# Patient Record
Sex: Female | Born: 1935 | Race: White | Hispanic: No | Marital: Single | Smoking: Never smoker
Health system: Southern US, Community
[De-identification: ages and names within clinical notes are randomized; demographics above are authoritative.]

## PROBLEM LIST (undated history)

## (undated) DIAGNOSIS — R0602 Shortness of breath: Secondary | ICD-10-CM

## (undated) DIAGNOSIS — E871 Hypo-osmolality and hyponatremia: Secondary | ICD-10-CM

## (undated) DIAGNOSIS — N289 Disorder of kidney and ureter, unspecified: Secondary | ICD-10-CM

## (undated) DIAGNOSIS — M316 Other giant cell arteritis: Secondary | ICD-10-CM

## (undated) DIAGNOSIS — E039 Hypothyroidism, unspecified: Secondary | ICD-10-CM

## (undated) DIAGNOSIS — I10 Essential (primary) hypertension: Secondary | ICD-10-CM

## (undated) DIAGNOSIS — K219 Gastro-esophageal reflux disease without esophagitis: Secondary | ICD-10-CM

## (undated) DIAGNOSIS — S72009A Fracture of unspecified part of neck of unspecified femur, initial encounter for closed fracture: Secondary | ICD-10-CM

## (undated) DIAGNOSIS — I4891 Unspecified atrial fibrillation: Secondary | ICD-10-CM

## (undated) DIAGNOSIS — E785 Hyperlipidemia, unspecified: Secondary | ICD-10-CM

## (undated) DIAGNOSIS — I209 Angina pectoris, unspecified: Secondary | ICD-10-CM

## (undated) DIAGNOSIS — Z950 Presence of cardiac pacemaker: Secondary | ICD-10-CM

## (undated) DIAGNOSIS — E876 Hypokalemia: Secondary | ICD-10-CM

## (undated) HISTORY — DX: Hypothyroidism, unspecified: E03.9

## (undated) HISTORY — DX: Gastro-esophageal reflux disease without esophagitis: K21.9

## (undated) HISTORY — PX: ABDOMINAL HYSTERECTOMY: SHX81

## (undated) HISTORY — DX: Other giant cell arteritis: M31.6

## (undated) HISTORY — PX: OTHER SURGICAL HISTORY: SHX169

## (undated) HISTORY — PX: CHOLECYSTECTOMY: SHX55

## (undated) HISTORY — PX: ORIF ACETABULAR FRACTURE: SHX5029

## (undated) HISTORY — DX: Hypokalemia: E87.6

## (undated) HISTORY — DX: Hypo-osmolality and hyponatremia: E87.1

## (undated) HISTORY — DX: Unspecified atrial fibrillation: I48.91

## (undated) HISTORY — PX: INSERT / REPLACE / REMOVE PACEMAKER: SUR710

## (undated) HISTORY — PX: CATARACT EXTRACTION: SUR2

---

## 1998-08-26 ENCOUNTER — Ambulatory Visit (HOSPITAL_COMMUNITY): Admission: RE | Admit: 1998-08-26 | Discharge: 1998-08-26 | Payer: Self-pay | Admitting: Cardiology

## 1999-08-31 ENCOUNTER — Ambulatory Visit (HOSPITAL_COMMUNITY): Admission: RE | Admit: 1999-08-31 | Discharge: 1999-08-31 | Payer: Self-pay | Admitting: Cardiology

## 1999-08-31 ENCOUNTER — Encounter: Payer: Self-pay | Admitting: Cardiology

## 1999-09-30 ENCOUNTER — Ambulatory Visit (HOSPITAL_BASED_OUTPATIENT_CLINIC_OR_DEPARTMENT_OTHER): Admission: RE | Admit: 1999-09-30 | Discharge: 1999-09-30 | Payer: Self-pay | Admitting: Plastic Surgery

## 1999-09-30 ENCOUNTER — Encounter (INDEPENDENT_AMBULATORY_CARE_PROVIDER_SITE_OTHER): Payer: Self-pay | Admitting: Specialist

## 1999-11-17 ENCOUNTER — Other Ambulatory Visit: Admission: RE | Admit: 1999-11-17 | Discharge: 1999-11-17 | Payer: Self-pay | Admitting: Cardiology

## 2000-08-31 ENCOUNTER — Ambulatory Visit (HOSPITAL_COMMUNITY): Admission: RE | Admit: 2000-08-31 | Discharge: 2000-08-31 | Payer: Self-pay | Admitting: Cardiology

## 2000-08-31 ENCOUNTER — Encounter: Payer: Self-pay | Admitting: Cardiology

## 2001-09-07 ENCOUNTER — Encounter: Payer: Self-pay | Admitting: Cardiology

## 2001-09-07 ENCOUNTER — Ambulatory Visit (HOSPITAL_COMMUNITY): Admission: RE | Admit: 2001-09-07 | Discharge: 2001-09-07 | Payer: Self-pay | Admitting: Cardiology

## 2002-10-22 ENCOUNTER — Ambulatory Visit (HOSPITAL_COMMUNITY): Admission: RE | Admit: 2002-10-22 | Discharge: 2002-10-22 | Payer: Self-pay | Admitting: Cardiology

## 2002-10-22 ENCOUNTER — Encounter: Payer: Self-pay | Admitting: Cardiology

## 2002-10-26 ENCOUNTER — Encounter: Payer: Self-pay | Admitting: Cardiology

## 2002-10-26 ENCOUNTER — Encounter: Admission: RE | Admit: 2002-10-26 | Discharge: 2002-10-26 | Payer: Self-pay | Admitting: Cardiology

## 2002-11-06 ENCOUNTER — Encounter (INDEPENDENT_AMBULATORY_CARE_PROVIDER_SITE_OTHER): Payer: Self-pay | Admitting: *Deleted

## 2002-11-06 ENCOUNTER — Encounter: Admission: RE | Admit: 2002-11-06 | Discharge: 2002-11-06 | Payer: Self-pay | Admitting: Cardiology

## 2002-11-06 ENCOUNTER — Encounter: Payer: Self-pay | Admitting: Cardiology

## 2003-01-08 ENCOUNTER — Ambulatory Visit (HOSPITAL_COMMUNITY): Admission: RE | Admit: 2003-01-08 | Discharge: 2003-01-08 | Payer: Self-pay | Admitting: Cardiology

## 2003-04-15 ENCOUNTER — Encounter (HOSPITAL_COMMUNITY): Admission: RE | Admit: 2003-04-15 | Discharge: 2003-07-14 | Payer: Self-pay | Admitting: Cardiology

## 2003-07-23 ENCOUNTER — Encounter (HOSPITAL_COMMUNITY): Admission: RE | Admit: 2003-07-23 | Discharge: 2003-10-21 | Payer: Self-pay | Admitting: Cardiology

## 2003-10-23 ENCOUNTER — Encounter (HOSPITAL_COMMUNITY): Admission: RE | Admit: 2003-10-23 | Discharge: 2004-01-21 | Payer: Self-pay | Admitting: Cardiology

## 2003-12-09 ENCOUNTER — Encounter: Admission: RE | Admit: 2003-12-09 | Discharge: 2003-12-09 | Payer: Self-pay | Admitting: Cardiology

## 2004-01-23 ENCOUNTER — Encounter (HOSPITAL_COMMUNITY): Admission: RE | Admit: 2004-01-23 | Discharge: 2004-04-22 | Payer: Self-pay | Admitting: Cardiology

## 2004-04-23 ENCOUNTER — Encounter (HOSPITAL_COMMUNITY): Admission: RE | Admit: 2004-04-23 | Discharge: 2004-07-22 | Payer: Self-pay | Admitting: Cardiology

## 2004-07-23 ENCOUNTER — Encounter (HOSPITAL_COMMUNITY): Admission: RE | Admit: 2004-07-23 | Discharge: 2004-10-21 | Payer: Self-pay | Admitting: Cardiology

## 2004-10-22 ENCOUNTER — Encounter (HOSPITAL_COMMUNITY): Admission: RE | Admit: 2004-10-22 | Discharge: 2005-01-20 | Payer: Self-pay | Admitting: Cardiology

## 2004-12-21 ENCOUNTER — Ambulatory Visit (HOSPITAL_COMMUNITY): Admission: RE | Admit: 2004-12-21 | Discharge: 2004-12-21 | Payer: Self-pay | Admitting: Cardiology

## 2005-01-05 ENCOUNTER — Ambulatory Visit (HOSPITAL_COMMUNITY): Admission: RE | Admit: 2005-01-05 | Discharge: 2005-01-05 | Payer: Self-pay | Admitting: Cardiology

## 2005-01-22 ENCOUNTER — Encounter (HOSPITAL_COMMUNITY): Admission: RE | Admit: 2005-01-22 | Discharge: 2005-04-22 | Payer: Self-pay | Admitting: Cardiology

## 2005-04-23 ENCOUNTER — Encounter (HOSPITAL_COMMUNITY): Admission: RE | Admit: 2005-04-23 | Discharge: 2005-07-21 | Payer: Self-pay | Admitting: Cardiology

## 2005-07-23 ENCOUNTER — Encounter (HOSPITAL_COMMUNITY): Admission: RE | Admit: 2005-07-23 | Discharge: 2005-10-21 | Payer: Self-pay | Admitting: Cardiology

## 2005-10-22 ENCOUNTER — Encounter (HOSPITAL_COMMUNITY): Admission: RE | Admit: 2005-10-22 | Discharge: 2006-01-20 | Payer: Self-pay | Admitting: Cardiology

## 2005-11-02 ENCOUNTER — Inpatient Hospital Stay (HOSPITAL_COMMUNITY): Admission: EM | Admit: 2005-11-02 | Discharge: 2005-11-04 | Payer: Self-pay | Admitting: *Deleted

## 2005-12-22 ENCOUNTER — Ambulatory Visit (HOSPITAL_COMMUNITY): Admission: RE | Admit: 2005-12-22 | Discharge: 2005-12-22 | Payer: Self-pay | Admitting: Cardiology

## 2005-12-25 ENCOUNTER — Inpatient Hospital Stay (HOSPITAL_COMMUNITY): Admission: EM | Admit: 2005-12-25 | Discharge: 2005-12-26 | Payer: Self-pay | Admitting: Emergency Medicine

## 2006-01-04 ENCOUNTER — Encounter: Admission: RE | Admit: 2006-01-04 | Discharge: 2006-01-04 | Payer: Self-pay | Admitting: Cardiology

## 2006-01-22 ENCOUNTER — Encounter (HOSPITAL_COMMUNITY): Admission: RE | Admit: 2006-01-22 | Discharge: 2006-04-22 | Payer: Self-pay | Admitting: Cardiology

## 2006-04-21 ENCOUNTER — Encounter: Admission: RE | Admit: 2006-04-21 | Discharge: 2006-04-21 | Payer: Self-pay | Admitting: Interventional Radiology

## 2006-04-23 ENCOUNTER — Encounter (HOSPITAL_COMMUNITY): Admission: RE | Admit: 2006-04-23 | Discharge: 2006-07-22 | Payer: Self-pay | Admitting: Cardiology

## 2006-07-23 ENCOUNTER — Encounter (HOSPITAL_COMMUNITY): Admission: RE | Admit: 2006-07-23 | Discharge: 2006-10-21 | Payer: Self-pay | Admitting: Cardiology

## 2006-10-24 ENCOUNTER — Encounter (HOSPITAL_COMMUNITY): Admission: RE | Admit: 2006-10-24 | Discharge: 2007-01-22 | Payer: Self-pay | Admitting: Cardiology

## 2007-01-23 ENCOUNTER — Encounter (HOSPITAL_COMMUNITY): Admission: RE | Admit: 2007-01-23 | Discharge: 2007-04-23 | Payer: Self-pay | Admitting: Cardiology

## 2007-01-25 ENCOUNTER — Ambulatory Visit (HOSPITAL_COMMUNITY): Admission: RE | Admit: 2007-01-25 | Discharge: 2007-01-25 | Payer: Self-pay | Admitting: Cardiology

## 2007-04-24 ENCOUNTER — Encounter (HOSPITAL_COMMUNITY): Admission: RE | Admit: 2007-04-24 | Discharge: 2007-05-23 | Payer: Self-pay | Admitting: Cardiology

## 2007-05-25 ENCOUNTER — Encounter (HOSPITAL_COMMUNITY): Admission: RE | Admit: 2007-05-25 | Discharge: 2007-08-17 | Payer: Self-pay | Admitting: Cardiology

## 2007-07-28 ENCOUNTER — Other Ambulatory Visit: Payer: Self-pay | Admitting: Ophthalmology

## 2007-08-02 ENCOUNTER — Ambulatory Visit (HOSPITAL_COMMUNITY): Admission: RE | Admit: 2007-08-02 | Discharge: 2007-08-02 | Payer: Self-pay | Admitting: Ophthalmology

## 2007-08-18 ENCOUNTER — Emergency Department (HOSPITAL_COMMUNITY): Admission: EM | Admit: 2007-08-18 | Discharge: 2007-08-18 | Payer: Self-pay | Admitting: Family Medicine

## 2007-08-23 ENCOUNTER — Encounter (HOSPITAL_COMMUNITY): Admission: RE | Admit: 2007-08-23 | Discharge: 2007-11-21 | Payer: Self-pay | Admitting: Cardiology

## 2007-11-22 ENCOUNTER — Encounter (HOSPITAL_COMMUNITY): Admission: RE | Admit: 2007-11-22 | Discharge: 2008-02-20 | Payer: Self-pay | Admitting: Internal Medicine

## 2008-01-30 ENCOUNTER — Ambulatory Visit (HOSPITAL_COMMUNITY): Admission: RE | Admit: 2008-01-30 | Discharge: 2008-01-30 | Payer: Self-pay | Admitting: Internal Medicine

## 2008-02-22 ENCOUNTER — Encounter (HOSPITAL_COMMUNITY): Admission: RE | Admit: 2008-02-22 | Discharge: 2008-05-20 | Payer: Self-pay | Admitting: Family Medicine

## 2008-05-24 ENCOUNTER — Encounter (HOSPITAL_COMMUNITY): Admission: RE | Admit: 2008-05-24 | Discharge: 2008-08-22 | Payer: Self-pay | Admitting: Family Medicine

## 2008-08-23 ENCOUNTER — Encounter (HOSPITAL_COMMUNITY): Admission: RE | Admit: 2008-08-23 | Discharge: 2008-11-21 | Payer: Self-pay | Admitting: Family Medicine

## 2008-11-22 ENCOUNTER — Encounter (HOSPITAL_COMMUNITY): Admission: RE | Admit: 2008-11-22 | Discharge: 2009-02-20 | Payer: Self-pay | Admitting: Family Medicine

## 2009-02-21 ENCOUNTER — Encounter (HOSPITAL_COMMUNITY): Admission: RE | Admit: 2009-02-21 | Discharge: 2009-04-22 | Payer: Self-pay | Admitting: Family Medicine

## 2009-02-27 ENCOUNTER — Ambulatory Visit (HOSPITAL_COMMUNITY): Admission: RE | Admit: 2009-02-27 | Discharge: 2009-02-27 | Payer: Self-pay

## 2009-03-05 ENCOUNTER — Inpatient Hospital Stay (HOSPITAL_COMMUNITY): Admission: EM | Admit: 2009-03-05 | Discharge: 2009-03-13 | Payer: Self-pay | Admitting: Emergency Medicine

## 2009-03-06 ENCOUNTER — Encounter (INDEPENDENT_AMBULATORY_CARE_PROVIDER_SITE_OTHER): Payer: Self-pay | Admitting: Internal Medicine

## 2009-03-11 ENCOUNTER — Ambulatory Visit: Payer: Self-pay | Admitting: Physical Medicine & Rehabilitation

## 2009-03-13 ENCOUNTER — Ambulatory Visit: Payer: Self-pay | Admitting: Physical Medicine & Rehabilitation

## 2009-03-13 ENCOUNTER — Inpatient Hospital Stay (HOSPITAL_COMMUNITY)
Admission: RE | Admit: 2009-03-13 | Discharge: 2009-04-03 | Payer: Self-pay | Admitting: Physical Medicine & Rehabilitation

## 2009-06-11 ENCOUNTER — Encounter: Payer: Self-pay | Admitting: Internal Medicine

## 2009-10-18 ENCOUNTER — Inpatient Hospital Stay (HOSPITAL_COMMUNITY): Admission: EM | Admit: 2009-10-18 | Discharge: 2009-10-23 | Payer: Self-pay | Admitting: Emergency Medicine

## 2009-12-23 DIAGNOSIS — Z9889 Other specified postprocedural states: Secondary | ICD-10-CM | POA: Insufficient documentation

## 2009-12-23 DIAGNOSIS — E039 Hypothyroidism, unspecified: Secondary | ICD-10-CM | POA: Insufficient documentation

## 2009-12-23 DIAGNOSIS — E871 Hypo-osmolality and hyponatremia: Secondary | ICD-10-CM

## 2009-12-23 DIAGNOSIS — I4891 Unspecified atrial fibrillation: Secondary | ICD-10-CM

## 2009-12-23 DIAGNOSIS — E876 Hypokalemia: Secondary | ICD-10-CM | POA: Insufficient documentation

## 2009-12-23 DIAGNOSIS — K219 Gastro-esophageal reflux disease without esophagitis: Secondary | ICD-10-CM | POA: Insufficient documentation

## 2009-12-23 DIAGNOSIS — M316 Other giant cell arteritis: Secondary | ICD-10-CM

## 2009-12-26 ENCOUNTER — Ambulatory Visit: Payer: Self-pay | Admitting: Internal Medicine

## 2009-12-26 DIAGNOSIS — I509 Heart failure, unspecified: Secondary | ICD-10-CM | POA: Insufficient documentation

## 2010-01-13 ENCOUNTER — Ambulatory Visit (HOSPITAL_COMMUNITY)
Admission: RE | Admit: 2010-01-13 | Discharge: 2010-01-13 | Payer: Self-pay | Source: Home / Self Care | Admitting: Internal Medicine

## 2010-01-13 ENCOUNTER — Ambulatory Visit: Payer: Self-pay | Admitting: Cardiovascular Disease

## 2010-01-13 ENCOUNTER — Encounter: Payer: Self-pay | Admitting: Internal Medicine

## 2010-01-13 ENCOUNTER — Encounter (INDEPENDENT_AMBULATORY_CARE_PROVIDER_SITE_OTHER): Payer: Self-pay | Admitting: *Deleted

## 2010-01-13 ENCOUNTER — Ambulatory Visit: Payer: Self-pay

## 2010-03-19 ENCOUNTER — Ambulatory Visit (HOSPITAL_COMMUNITY): Admission: RE | Admit: 2010-03-19 | Discharge: 2010-03-19 | Payer: Self-pay | Admitting: Internal Medicine

## 2010-06-25 ENCOUNTER — Encounter (INDEPENDENT_AMBULATORY_CARE_PROVIDER_SITE_OTHER): Payer: Medicare Other

## 2010-06-25 ENCOUNTER — Encounter: Payer: Self-pay | Admitting: Internal Medicine

## 2010-06-25 ENCOUNTER — Ambulatory Visit: Admit: 2010-06-25 | Payer: Self-pay | Admitting: Internal Medicine

## 2010-06-25 DIAGNOSIS — I495 Sick sinus syndrome: Secondary | ICD-10-CM

## 2010-06-25 NOTE — Letter (Signed)
Summary: Outpatient Coinsurance Notice  Outpatient Coinsurance Notice   Imported By: Marylou Mccoy 01/23/2010 10:31:32  _____________________________________________________________________  External Attachment:    Type:   Image     Comment:   External Document

## 2010-06-25 NOTE — Letter (Signed)
Summary: [facility]  [facility]   Imported By: Marylou Mccoy 01/06/2010 11:27:10  _____________________________________________________________________  External Attachment:    Type:   Image     Comment:   External Document

## 2010-06-25 NOTE — Assessment & Plan Note (Signed)
Summary: NEP/AFIB/JML   Visit Type:  Initial Consult Primary Khrystian Schauf:  Dr Jerald Kief / Former pt of Dr Angelena Form  CC:  to establish a new cardiologist Dr Aleen Campi retired.  History of Present Illness: Janice [name] is referred today by Dr. Aleen Campi for evaluation of atrial fibrillation and ongoing PPM evaluation.  She has a h/o of mitral valve repair over 5 yrs ago.  She then developed symptomatic bradycardia and underwent VVI PM insertion by Dr. Aleen Campi in 2007.  She has been in atrial fibrillation for many years but has been mostly asymptomatic.  She does note that she has had some increasing dyspnea over the past few weeks.  No syncope.  Minimal peripheral edema.  She does note some unsteadiness on her feet and has been using a walker.    Current Medications (verified): 1)  Bisoprolol Fumarate 10 Mg Tabs (Bisoprolol Fumarate) .Marland Kitchen.. 1 Ta Two Times A Day 2)  Calicum /vit D 500mg  .... 1 Tab Two Times A Day 3)  Furosemide 20 Mg Tabs (Furosemide) .... Take One Tablet By Mouth Daily. 4)  Ocuvite .Marland Kitchen.. 1 Tab Once Daily 5)  Potassium Chloride Crys Cr 20 Meq Cr-Tabs (Potassium Chloride Crys Cr) .... Take One Tablet By Mouth Daily 6)  Prednisone 10 Mg Tabs (Prednisone) .Marland Kitchen.. 1 Tab Once Daily 7)  Omeprazole 20 Mg Cpdr (Omeprazole) .Marland Kitchen.. 1 Tab Once Daily 8)  Simvastatin 5 Mg Tabs (Simvastatin) .... Take One Tablet By Mouth Daily At Bedtime 9)  Synthroid 50 Mcg Tabs (Levothyroxine Sodium) .Marland Kitchen.. 1 Tab Once Daily 10)  Warfarin Sodium 2 Mg Tabs (Warfarin Sodium) .... Use As Directed By Anticoagualtion Clinic  Allergies (verified): No Known Drug Allergies  Past History:  Past Medical History: Last updated: 12/23/2009 Current Problems:  HYPOKALEMIA (ICD-276.8) GERD (ICD-530.81) TEMPORAL ARTERITIS (ICD-446.5) PERS HX SURG HRT&GREAT VES PRS HAZARDS HEALTH (ICD-V15.1) ATRIAL FIBRILLATION (ICD-427.31) HYPOTHYROIDISM (ICD-244.9) HYPONATREMIA (ICD-276.1)    Past Surgical History: s/p MV repair at  [facility] s/p PPM  Review of Systems       The patient complains of dyspnea on exertion.  The patient denies chest pain, syncope, and peripheral edema.    Vital Signs:  Patient profile:   77 year old female Height:      67 inches Weight:      112 pounds BMI:     17.61 Pulse rate:   72 / minute BP sitting:   107 / 62  (left arm) Cuff size:   regular  Vitals Entered By: Burnett Kanaris, CNA (December 26, 2009 2:38 PM)  Physical Exam  General:  Elderly, well developed, well nourished, in no acute distress.  HEENT: normal Neck: supple. No JVD. Carotids 2+ bilaterally no bruits Cor: IRIR with no rubs, gallops or murmur Lungs: CTA. Well healed PPM incision. Ab: soft, nontender. nondistended. No HSM. Good bowel sounds Ext: warm. no cyanosis, clubbing or edema Neuro: alert and oriented. Grossly nonfocal. affect pleasant    PPM Specifications Following MD:  Lewayne Bunting, MD     PPM Vendor:  [employer] Jude     PPM Model Number:  [id number]     PPM Serial Number:  [id number] PPM DOI:  11/03/2005     PPM Implanting MD:  NOT IMPLANTED BY [place]  Lead 1    Location: RA     DOI: 11/03/2005     Model #: 1788TC     Serial #: [id number]     Status: active Lead 2    Location: RV  DOI: 11/03/2005     Model #: 1336T     Serial #: [id number]     Status: active  Magnet Response Rate:  BOL 98.6 ERI 86.3  Indications:  A-fib   PPM Follow Up Remote Check?  No Battery Voltage:  2.79 V     Battery Est. Longevity:  10 years     Pacer Dependent:  No     Right Ventricle  Amplitude: 9.6 mV, Impedance: 634 ohms, Threshold: 0.75 V at 0.5 msec  Episodes Ventricular Pacing:  99%  Parameters Mode:  VVIR     Lower Rate Limit:  70     Upper Rate Limit:  120 Next Cardiology Appt Due:  06/24/2010 Tech Comments:  No parameter changes.  Device function normal.  ROV 6 months clinic. Altha Harm, LPN  December 26, 2009 3:07 PM  MD Comments:  Agree with above.  Impression & Recommendations:  Problem # 1:  ATRIAL FIBRILLATION  (ICD-427.31) Her symptoms appear to be well controlled.  I have asked her to continue her medications as below.  She may ultimately not be able to continue coumadin if she has additional falls. Her updated medication list for this problem includes:    Bisoprolol Fumarate 10 Mg Tabs (Bisoprolol fumarate) .Marland Kitchen... 1 ta two times a day    Warfarin Sodium 2 Mg Tabs (Warfarin sodium) ..... Use as directed by anticoagualtion clinic  Orders: Echocardiogram (Echo)  Problem # 2:  HYPONATREMIA (ICD-276.1) I suspect this related to her diuretic therapy. I have asked her to reduce her free water intake.    Problem # 3:  CHF (ICD-428.0) Her symptoms appear to be class 2 but may have worsened and I plan to proceed with a 2D echo to evaluate her LV function and valve function.  No change in medications at present though may consider adjustment based on the results of her echo. Her updated medication list for this problem includes:    Bisoprolol Fumarate 10 Mg Tabs (Bisoprolol fumarate) .Marland Kitchen... 1 ta two times a day    Furosemide 20 Mg Tabs (Furosemide) .Marland Kitchen... Take one tablet by mouth daily.    Warfarin Sodium 2 Mg Tabs (Warfarin sodium) ..... Use as directed by anticoagualtion clinic  Patient Instructions: 1)  Your physician recommends that you schedule a follow-up appointment in: 6 months device clinic 2)  Your physician has requested that you have an echocardiogram.  Echocardiography is a painless test that uses sound waves to create images of your heart. It provides your doctor with information about the size and shape of your heart and how well your heart's chambers and valves are working.  This procedure takes approximately one hour. There are no restrictions for this procedure.  Appended Document: NEP/AFIB/JML Her PPM was evaluated today and found to be working normally.

## 2010-06-25 NOTE — Cardiovascular Report (Signed)
Summary: Office Visit   Office Visit   Imported By: Roderic Ovens 01/01/2010 11:24:00  _____________________________________________________________________  External Attachment:    Type:   Image     Comment:   External Document

## 2010-07-09 NOTE — Cardiovascular Report (Signed)
Summary: Office Visit   Office Visit   Imported By: Roderic Ovens 07/02/2010 16:11:14  _____________________________________________________________________  External Attachment:    Type:   Image     Comment:   External Document

## 2010-07-09 NOTE — Procedures (Signed)
Summary: Cardiology Device Clinic   Current Medications (verified): 1)  Bisoprolol Fumarate 10 Mg Tabs (Bisoprolol Fumarate) .Marland Kitchen.. 1 Ta Two Times A Day 2)  Calicum /vit D 500mg  .... 1 Tab Two Times A Day 3)  Furosemide 20 Mg Tabs (Furosemide) .... Take One Tablet By Mouth Daily. 4)  Ocuvite .Marland Kitchen.. 1 Tab Once Daily 5)  Potassium Chloride Crys Cr 20 Meq Cr-Tabs (Potassium Chloride Crys Cr) .... Take One Tablet By Mouth Daily 6)  Prednisone 10 Mg Tabs (Prednisone) .Marland Kitchen.. 1 Tab Once Daily 7)  Omeprazole 20 Mg Cpdr (Omeprazole) .Marland Kitchen.. 1 Tab Once Daily 8)  Simvastatin 5 Mg Tabs (Simvastatin) .... Take One Tablet By Mouth Daily At Bedtime 9)  Synthroid 50 Mcg Tabs (Levothyroxine Sodium) .Marland Kitchen.. 1 Tab Once Daily 10)  Warfarin Sodium 2 Mg Tabs (Warfarin Sodium) .... Use As Directed By Anticoagualtion Clinic 11)  Methotrexate 2.5 Mg Tabs (Methotrexate Sodium) .... Take 3 Tablets By Mouth Per Week 12)  Folic Acid 1 Mg Tabs (Folic Acid) .... Take 1 Tablet By Mouth Every Evening  Allergies (verified): No Known Drug Allergies  PPM Specifications Following MD:  Lewayne Bunting, MD     PPM Vendor:  [employer]     PPM Model Number:  [id number]     PPM Serial Number:  [id number] PPM DOI:  11/03/2005     PPM Implanting MD:  NOT IMPLANTED BY [place]  Lead 1    Location: RA     DOI: 11/03/2005     Model #: 1788TC     Serial #: [id number]     Status: active Lead 2    Location: RV     DOI: 11/03/2005     Model #: 1336T     Serial #: [id number]     Status: active  Magnet Response Rate:  BOL 98.6 ERI 86.3  Indications:  A-fib   PPM Follow Up Battery Voltage:  2.79 V     Battery Est. Longevity:  8.75-10 yrs     Pacer Dependent:  No     Right Ventricle  Amplitude: 11.8 mV, Impedance: 653 ohms, Threshold: 0.75 V at 0.5 msec  Episodes MS Episodes:  0     Ventricular High Rate:  0     Ventricular Pacing:  86%  Parameters Mode:  VVIR     Lower Rate Limit:  70     Upper Rate Limit:  120 Next Cardiology Appt Due:  12/23/2010 Tech Comments:   NORMAL DEVICE FUNCTION.  NO CHANGES MADE. ROV IN 6 MTHS W/GT. Vella Kohler  June 25, 2010 4:18 PM

## 2010-08-10 LAB — CARDIAC PANEL(CRET KIN+CKTOT+MB+TROPI)
CK, MB: 1.6 ng/mL (ref 0.3–4.0)
Relative Index: INVALID (ref 0.0–2.5)
Total CK: 158 U/L (ref 7–177)
Total CK: 95 U/L (ref 7–177)
Troponin I: 0.02 ng/mL (ref 0.00–0.06)
Troponin I: 0.03 ng/mL (ref 0.00–0.06)

## 2010-08-10 LAB — URINALYSIS, ROUTINE W REFLEX MICROSCOPIC
Glucose, UA: NEGATIVE mg/dL
Hgb urine dipstick: NEGATIVE
Specific Gravity, Urine: 1.01 (ref 1.005–1.030)

## 2010-08-10 LAB — BASIC METABOLIC PANEL
BUN: 12 mg/dL (ref 6–23)
BUN: 17 mg/dL (ref 6–23)
CO2: 29 mEq/L (ref 19–32)
CO2: 26 mEq/L (ref 19–32)
CO2: 30 mEq/L (ref 19–32)
Calcium: 8.5 mg/dL (ref 8.4–10.5)
Calcium: 8.5 mg/dL (ref 8.4–10.5)
Chloride: 90 mEq/L — ABNORMAL LOW (ref 96–112)
Chloride: 96 mEq/L (ref 96–112)
Chloride: 96 mEq/L (ref 96–112)
Creatinine, Ser: 0.89 mg/dL (ref 0.4–1.2)
Creatinine, Ser: 0.87 mg/dL (ref 0.4–1.2)
Creatinine, Ser: 0.89 mg/dL (ref 0.4–1.2)
GFR calc Af Amer: >60 mL/min (ref >60)
GFR calc Af Amer: >60 mL/min (ref >60)
GFR calc Af Amer: >60 mL/min (ref >60)
Glucose, Bld: 86 mg/dL (ref 70–99)
Sodium: 125 mEq/L — ABNORMAL LOW (ref 135–145)
Sodium: 127 mEq/L — ABNORMAL LOW (ref 135–145)
Sodium: 131 mEq/L — ABNORMAL LOW (ref 135–145)

## 2010-08-10 LAB — PROTIME-INR
INR: 2.12 — ABNORMAL HIGH (ref 0.00–1.49)
INR: 1.89 — ABNORMAL HIGH (ref 0.00–1.49)
INR: 2 — ABNORMAL HIGH (ref 0.00–1.49)
Prothrombin Time: 23.6 seconds — ABNORMAL HIGH (ref 11.6–15.2)
Prothrombin Time: 20 seconds — ABNORMAL HIGH (ref 11.6–15.2)
Prothrombin Time: 22.5 seconds — ABNORMAL HIGH (ref 11.6–15.2)

## 2010-08-10 LAB — DIFFERENTIAL
Basophils Relative: 0 % (ref 0–1)
Eosinophils Absolute: 0 10*3/uL (ref 0.0–0.7)
Monocytes Absolute: 0.7 10*3/uL (ref 0.1–1.0)
Monocytes Relative: 7 % (ref 3–12)
Neutro Abs: 7.5 10*3/uL (ref 1.7–7.7)

## 2010-08-10 LAB — CBC
HCT: 29.7 % — ABNORMAL LOW (ref 36.0–46.0)
HCT: 32.8 % — ABNORMAL LOW (ref 36.0–46.0)
Hemoglobin: 11.7 g/dL — ABNORMAL LOW (ref 12.0–15.0)
Hemoglobin: 11.7 g/dL — ABNORMAL LOW (ref 12.0–15.0)
Hemoglobin: 8.6 g/dL — ABNORMAL LOW (ref 12.0–15.0)
MCHC: 34.5 g/dL (ref 30.0–36.0)
MCHC: 35.6 g/dL (ref 30.0–36.0)
MCV: 90.4 fL (ref 78.0–100.0)
MCV: 88.7 fL (ref 78.0–100.0)
MCV: 88.8 fL (ref 78.0–100.0)
MCV: 89.3 fL (ref 78.0–100.0)
Platelets: 209 10*3/uL (ref 150–400)
Platelets: 172 10*3/uL (ref 150–400)
Platelets: 173 10*3/uL (ref 150–400)
RBC: 2.77 MIL/uL — ABNORMAL LOW (ref 3.87–5.11)
RBC: 3.23 MIL/uL — ABNORMAL LOW (ref 3.87–5.11)
RBC: 3.7 MIL/uL — ABNORMAL LOW (ref 3.87–5.11)
RBC: 3.71 MIL/uL — ABNORMAL LOW (ref 3.87–5.11)
RDW: 14.9 % (ref 11.5–15.5)
RDW: 14.7 % (ref 11.5–15.5)
RDW: 14.8 % (ref 11.5–15.5)
WBC: 12.8 10*3/uL — ABNORMAL HIGH (ref 4.0–10.5)
WBC: 8.9 10*3/uL (ref 4.0–10.5)

## 2010-08-10 LAB — OSMOLALITY: Osmolality: 262 mOsm/kg — ABNORMAL LOW (ref 275–300)

## 2010-08-10 LAB — TYPE AND SCREEN
ABO/RH(D): A POS
DAT, IgG: NEGATIVE

## 2010-08-10 LAB — CK TOTAL AND CKMB (NOT AT ARMC): CK, MB: 1.7 ng/mL (ref 0.3–4.0)

## 2010-08-10 LAB — COMPREHENSIVE METABOLIC PANEL
ALT: 13 U/L (ref 0–35)
AST: 24 U/L (ref 0–37)
Albumin: 2.9 g/dL — ABNORMAL LOW (ref 3.5–5.2)
Alkaline Phosphatase: 90 U/L (ref 39–117)
BUN: 14 mg/dL (ref 6–23)
Chloride: 96 mEq/L (ref 96–112)
GFR calc Af Amer: >60 mL/min (ref >60)
Potassium: 3.4 mEq/L — ABNORMAL LOW (ref 3.5–5.1)
Sodium: 131 mEq/L — ABNORMAL LOW (ref 135–145)
Total Bilirubin: 0.7 mg/dL (ref 0.3–1.2)

## 2010-08-10 LAB — PREPARE FRESH FROZEN PLASMA

## 2010-08-10 LAB — OSMOLALITY, URINE: Osmolality, Ur: 264 mOsm/kg — ABNORMAL LOW (ref 390–1090)

## 2010-08-10 LAB — BRAIN NATRIURETIC PEPTIDE: Pro B Natriuretic peptide (BNP): 528 pg/mL — ABNORMAL HIGH (ref 0.0–100.0)

## 2010-08-10 LAB — APTT: aPTT: 37 seconds (ref 24–37)

## 2010-08-18 ENCOUNTER — Other Ambulatory Visit: Payer: Self-pay | Admitting: Rheumatology

## 2010-08-18 ENCOUNTER — Ambulatory Visit
Admission: RE | Admit: 2010-08-18 | Discharge: 2010-08-18 | Disposition: A | Discharge status: Home / Self Care | Payer: Medicare Other | Source: Ambulatory Visit | Attending: Rheumatology | Admitting: Rheumatology

## 2010-08-18 DIAGNOSIS — M79604 Pain in right leg: Secondary | ICD-10-CM

## 2010-08-18 DIAGNOSIS — M79606 Pain in leg, unspecified: Secondary | ICD-10-CM

## 2010-08-25 ENCOUNTER — Other Ambulatory Visit: Payer: Self-pay | Admitting: Rheumatology

## 2010-08-25 DIAGNOSIS — M79671 Pain in right foot: Secondary | ICD-10-CM

## 2010-08-26 LAB — BASIC METABOLIC PANEL
BUN: 10 mg/dL (ref 6–23)
BUN: 10 mg/dL (ref 6–23)
BUN: 8 mg/dL (ref 6–23)
BUN: 9 mg/dL (ref 6–23)
CO2: 24 mEq/L (ref 19–32)
CO2: 25 mEq/L (ref 19–32)
CO2: 25 mEq/L (ref 19–32)
CO2: 25 mEq/L (ref 19–32)
CO2: 26 mEq/L (ref 19–32)
Calcium: 7.8 mg/dL — ABNORMAL LOW (ref 8.4–10.5)
Calcium: 8.4 mg/dL (ref 8.4–10.5)
Calcium: 8.8 mg/dL (ref 8.4–10.5)
Chloride: 91 mEq/L — ABNORMAL LOW (ref 96–112)
Chloride: 94 mEq/L — ABNORMAL LOW (ref 96–112)
Chloride: 95 mEq/L — ABNORMAL LOW (ref 96–112)
Chloride: 96 mEq/L (ref 96–112)
Creatinine, Ser: 0.67 mg/dL (ref 0.4–1.2)
Creatinine, Ser: 0.73 mg/dL (ref 0.4–1.2)
Creatinine, Ser: 0.76 mg/dL (ref 0.4–1.2)
Creatinine, Ser: 0.78 mg/dL (ref 0.4–1.2)
GFR calc Af Amer: >60 mL/min (ref >60)
GFR calc Af Amer: >60 mL/min (ref >60)
GFR calc Af Amer: >60 mL/min (ref >60)
GFR calc non Af Amer: >60 mL/min (ref >60)
Glucose, Bld: 100 mg/dL — ABNORMAL HIGH (ref 70–99)
Glucose, Bld: 91 mg/dL (ref 70–99)
Potassium: 3.9 mEq/L (ref 3.5–5.1)
Potassium: 3.9 mEq/L (ref 3.5–5.1)
Potassium: 3.9 mEq/L (ref 3.5–5.1)
Sodium: 125 mEq/L — ABNORMAL LOW (ref 135–145)
Sodium: 126 mEq/L — ABNORMAL LOW (ref 135–145)
Sodium: 127 mEq/L — ABNORMAL LOW (ref 135–145)
Sodium: 129 mEq/L — ABNORMAL LOW (ref 135–145)

## 2010-08-26 LAB — PROTIME-INR
INR: 2.7 — ABNORMAL HIGH (ref 0.00–1.49)
INR: 2.72 — ABNORMAL HIGH (ref 0.00–1.49)
INR: 2.75 — ABNORMAL HIGH (ref 0.00–1.49)
INR: 3.12 — ABNORMAL HIGH (ref 0.00–1.49)
INR: 3.12 — ABNORMAL HIGH (ref 0.00–1.49)
INR: 3.29 — ABNORMAL HIGH (ref 0.00–1.49)
Prothrombin Time: 28.6 seconds — ABNORMAL HIGH (ref 11.6–15.2)
Prothrombin Time: 30.2 seconds — ABNORMAL HIGH (ref 11.6–15.2)
Prothrombin Time: 31.9 seconds — ABNORMAL HIGH (ref 11.6–15.2)
Prothrombin Time: 33.2 seconds — ABNORMAL HIGH (ref 11.6–15.2)

## 2010-08-26 LAB — CBC
HCT: 32 % — ABNORMAL LOW (ref 36.0–46.0)
Hemoglobin: 11.2 g/dL — ABNORMAL LOW (ref 12.0–15.0)
MCHC: 34.9 g/dL (ref 30.0–36.0)
MCV: 88.3 fL (ref 78.0–100.0)
RBC: 3.63 MIL/uL — ABNORMAL LOW (ref 3.87–5.11)
WBC: 8.6 10*3/uL (ref 4.0–10.5)

## 2010-08-27 LAB — SODIUM: Sodium: 125 mEq/L — ABNORMAL LOW (ref 135–145)

## 2010-08-27 LAB — CBC
HCT: 23 % — ABNORMAL LOW (ref 36.0–46.0)
HCT: 26 % — ABNORMAL LOW (ref 36.0–46.0)
HCT: 26.9 % — ABNORMAL LOW (ref 36.0–46.0)
HCT: 27.1 % — ABNORMAL LOW (ref 36.0–46.0)
HCT: 27.8 % — ABNORMAL LOW (ref 36.0–46.0)
HCT: 28 % — ABNORMAL LOW (ref 36.0–46.0)
HCT: 28.3 % — ABNORMAL LOW (ref 36.0–46.0)
Hemoglobin: 9.1 g/dL — ABNORMAL LOW (ref 12.0–15.0)
Hemoglobin: 9.2 g/dL — ABNORMAL LOW (ref 12.0–15.0)
Hemoglobin: 9.5 g/dL — ABNORMAL LOW (ref 12.0–15.0)
Hemoglobin: 9.5 g/dL — ABNORMAL LOW (ref 12.0–15.0)
Hemoglobin: 9.6 g/dL — ABNORMAL LOW (ref 12.0–15.0)
Hemoglobin: 9.6 g/dL — ABNORMAL LOW (ref 12.0–15.0)
Hemoglobin: 9.9 g/dL — ABNORMAL LOW (ref 12.0–15.0)
MCHC: 33.8 g/dL (ref 30.0–36.0)
MCHC: 34 g/dL (ref 30.0–36.0)
MCHC: 34.9 g/dL (ref 30.0–36.0)
MCV: 89.1 fL (ref 78.0–100.0)
MCV: 89.8 fL (ref 78.0–100.0)
MCV: 90.2 fL (ref 78.0–100.0)
Platelets: 417 10*3/uL — ABNORMAL HIGH (ref 150–400)
Platelets: 469 10*3/uL — ABNORMAL HIGH (ref 150–400)
Platelets: 238 10*3/uL (ref 150–400)
Platelets: 345 10*3/uL (ref 150–400)
RBC: 2.56 MIL/uL — ABNORMAL LOW (ref 3.87–5.11)
RBC: 2.98 MIL/uL — ABNORMAL LOW (ref 3.87–5.11)
RBC: 2.99 MIL/uL — ABNORMAL LOW (ref 3.87–5.11)
RBC: 3.06 MIL/uL — ABNORMAL LOW (ref 3.87–5.11)
RBC: 3.09 MIL/uL — ABNORMAL LOW (ref 3.87–5.11)
RBC: 3.15 MIL/uL — ABNORMAL LOW (ref 3.87–5.11)
RBC: 3.18 MIL/uL — ABNORMAL LOW (ref 3.87–5.11)
RDW: 12.2 % (ref 11.5–15.5)
RDW: 12.8 % (ref 11.5–15.5)
RDW: 13.3 % (ref 11.5–15.5)
RDW: 13.4 % (ref 11.5–15.5)
RDW: 14 % (ref 11.5–15.5)
WBC: 14 10*3/uL — ABNORMAL HIGH (ref 4.0–10.5)
WBC: 18.8 10*3/uL — ABNORMAL HIGH (ref 4.0–10.5)
WBC: 20 10*3/uL — ABNORMAL HIGH (ref 4.0–10.5)
WBC: 8.6 10*3/uL (ref 4.0–10.5)
WBC: 8.7 10*3/uL (ref 4.0–10.5)

## 2010-08-27 LAB — PROTIME-INR
INR: 2.28 — ABNORMAL HIGH (ref 0.00–1.49)
INR: 2.84 — ABNORMAL HIGH (ref 0.00–1.49)
INR: 2.99 — ABNORMAL HIGH (ref 0.00–1.49)
INR: 3.43 — ABNORMAL HIGH (ref 0.00–1.49)
INR: 3.84 — ABNORMAL HIGH (ref 0.00–1.49)
INR: 4.08 — ABNORMAL HIGH (ref 0.00–1.49)
INR: 4.12 — ABNORMAL HIGH (ref 0.00–1.49)
INR: 6.45 (ref 0.00–1.49)
INR: 7.46 (ref 0.00–1.49)
Prothrombin Time: 23.6 seconds — ABNORMAL HIGH (ref 11.6–15.2)
Prothrombin Time: 24.9 seconds — ABNORMAL HIGH (ref 11.6–15.2)
Prothrombin Time: 26.6 seconds — ABNORMAL HIGH (ref 11.6–15.2)
Prothrombin Time: 27.9 seconds — ABNORMAL HIGH (ref 11.6–15.2)
Prothrombin Time: 29.6 seconds — ABNORMAL HIGH (ref 11.6–15.2)
Prothrombin Time: 30.8 seconds — ABNORMAL HIGH (ref 11.6–15.2)
Prothrombin Time: 39.3 seconds — ABNORMAL HIGH (ref 11.6–15.2)
Prothrombin Time: 39.6 seconds — ABNORMAL HIGH (ref 11.6–15.2)
Prothrombin Time: 41.6 seconds — ABNORMAL HIGH (ref 11.6–15.2)
Prothrombin Time: 56.2 seconds — ABNORMAL HIGH (ref 11.6–15.2)
Prothrombin Time: 62.9 seconds — ABNORMAL HIGH (ref 11.6–15.2)

## 2010-08-27 LAB — CROSSMATCH

## 2010-08-27 LAB — URINE CULTURE

## 2010-08-27 LAB — CLOSTRIDIUM DIFFICILE EIA: C difficile Toxins A+B, EIA: NEGATIVE

## 2010-08-27 LAB — COMPREHENSIVE METABOLIC PANEL
ALT: 40 U/L — ABNORMAL HIGH (ref 0–35)
ALT: 46 U/L — ABNORMAL HIGH (ref 0–35)
AST: 56 U/L — ABNORMAL HIGH (ref 0–37)
AST: 80 U/L — ABNORMAL HIGH (ref 0–37)
Albumin: 1.7 g/dL — ABNORMAL LOW (ref 3.5–5.2)
Alkaline Phosphatase: 107 U/L (ref 39–117)
Alkaline Phosphatase: 78 U/L (ref 39–117)
BUN: 20 mg/dL (ref 6–23)
BUN: 20 mg/dL (ref 6–23)
CO2: 19 mEq/L (ref 19–32)
CO2: 21 mEq/L (ref 19–32)
Calcium: 8 mg/dL — ABNORMAL LOW (ref 8.4–10.5)
Calcium: 8.5 mg/dL (ref 8.4–10.5)
Chloride: 104 mEq/L (ref 96–112)
Creatinine, Ser: 0.83 mg/dL (ref 0.4–1.2)
Creatinine, Ser: 2.43 mg/dL — ABNORMAL HIGH (ref 0.4–1.2)
GFR calc Af Amer: 23 mL/min — ABNORMAL LOW (ref >60)
GFR calc Af Amer: 38 mL/min — ABNORMAL LOW (ref >60)
GFR calc non Af Amer: 31 mL/min — ABNORMAL LOW (ref >60)
GFR calc non Af Amer: 49 mL/min — ABNORMAL LOW (ref >60)
Glucose, Bld: 115 mg/dL — ABNORMAL HIGH (ref 70–99)
Glucose, Bld: 154 mg/dL — ABNORMAL HIGH (ref 70–99)
Glucose, Bld: 85 mg/dL (ref 70–99)
Glucose, Bld: 88 mg/dL (ref 70–99)
Potassium: 3.6 mEq/L (ref 3.5–5.1)
Potassium: 3.6 mEq/L (ref 3.5–5.1)
Potassium: 4.4 mEq/L (ref 3.5–5.1)
Sodium: 124 mEq/L — ABNORMAL LOW (ref 135–145)
Sodium: 127 mEq/L — ABNORMAL LOW (ref 135–145)
Total Bilirubin: 0.5 mg/dL (ref 0.3–1.2)
Total Protein: 4.9 g/dL — ABNORMAL LOW (ref 6.0–8.3)
Total Protein: 5.1 g/dL — ABNORMAL LOW (ref 6.0–8.3)
Total Protein: 5.2 g/dL — ABNORMAL LOW (ref 6.0–8.3)
Total Protein: 5.5 g/dL — ABNORMAL LOW (ref 6.0–8.3)

## 2010-08-27 LAB — BASIC METABOLIC PANEL
BUN: 10 mg/dL (ref 6–23)
BUN: 16 mg/dL (ref 6–23)
BUN: 36 mg/dL — ABNORMAL HIGH (ref 6–23)
CO2: 20 mEq/L (ref 19–32)
CO2: 20 mEq/L (ref 19–32)
CO2: 21 mEq/L (ref 19–32)
CO2: 26 mEq/L (ref 19–32)
CO2: 27 mEq/L (ref 19–32)
Calcium: 8 mg/dL — ABNORMAL LOW (ref 8.4–10.5)
Calcium: 7.9 mg/dL — ABNORMAL LOW (ref 8.4–10.5)
Chloride: 104 mEq/L (ref 96–112)
Chloride: 106 mEq/L (ref 96–112)
Chloride: 92 mEq/L — ABNORMAL LOW (ref 96–112)
Chloride: 93 mEq/L — ABNORMAL LOW (ref 96–112)
GFR calc Af Amer: 30 mL/min — ABNORMAL LOW (ref >60)
GFR calc Af Amer: 32 mL/min — ABNORMAL LOW (ref >60)
GFR calc Af Amer: 51 mL/min — ABNORMAL LOW (ref >60)
GFR calc Af Amer: 56 mL/min — ABNORMAL LOW (ref >60)
GFR calc Af Amer: >60 mL/min (ref >60)
GFR calc Af Amer: >60 mL/min (ref >60)
GFR calc non Af Amer: 25 mL/min — ABNORMAL LOW (ref >60)
GFR calc non Af Amer: 46 mL/min — ABNORMAL LOW (ref >60)
GFR calc non Af Amer: >60 mL/min (ref >60)
GFR calc non Af Amer: >60 mL/min (ref >60)
Glucose, Bld: 126 mg/dL — ABNORMAL HIGH (ref 70–99)
Glucose, Bld: 94 mg/dL (ref 70–99)
Glucose, Bld: 97 mg/dL (ref 70–99)
Potassium: 3.3 mEq/L — ABNORMAL LOW (ref 3.5–5.1)
Potassium: 3.4 mEq/L — ABNORMAL LOW (ref 3.5–5.1)
Potassium: 3.6 mEq/L (ref 3.5–5.1)
Potassium: 3.8 mEq/L (ref 3.5–5.1)
Potassium: 3.8 mEq/L (ref 3.5–5.1)
Potassium: 3.8 mEq/L (ref 3.5–5.1)
Sodium: 121 mEq/L — ABNORMAL LOW (ref 135–145)
Sodium: 123 mEq/L — ABNORMAL LOW (ref 135–145)
Sodium: 127 mEq/L — ABNORMAL LOW (ref 135–145)
Sodium: 127 mEq/L — ABNORMAL LOW (ref 135–145)
Sodium: 131 mEq/L — ABNORMAL LOW (ref 135–145)
Sodium: 132 mEq/L — ABNORMAL LOW (ref 135–145)

## 2010-08-27 LAB — HEMOGLOBIN AND HEMATOCRIT, BLOOD
HCT: 23.4 % — ABNORMAL LOW (ref 36.0–46.0)
Hemoglobin: 8.1 g/dL — ABNORMAL LOW (ref 12.0–15.0)

## 2010-08-27 LAB — CULTURE, BLOOD (ROUTINE X 2)
Culture: NO GROWTH
Culture: NO GROWTH

## 2010-08-27 LAB — DIFFERENTIAL
Basophils Absolute: 0 10*3/uL (ref 0.0–0.1)
Basophils Relative: 0 % (ref 0–1)
Basophils Relative: 0 % (ref 0–1)
Lymphocytes Relative: 2 % — ABNORMAL LOW (ref 12–46)
Lymphocytes Relative: 7 % — ABNORMAL LOW (ref 12–46)
Lymphs Abs: 0.9 10*3/uL (ref 0.7–4.0)
Monocytes Absolute: 0.2 10*3/uL (ref 0.1–1.0)
Monocytes Absolute: 0.8 10*3/uL (ref 0.1–1.0)
Monocytes Relative: 6 % (ref 3–12)
Neutro Abs: 11.2 10*3/uL — ABNORMAL HIGH (ref 1.7–7.7)
Neutro Abs: 19.5 10*3/uL — ABNORMAL HIGH (ref 1.7–7.7)
Neutrophils Relative %: 86 % — ABNORMAL HIGH (ref 43–77)

## 2010-08-27 LAB — GLUCOSE, CAPILLARY

## 2010-08-27 LAB — URINALYSIS, ROUTINE W REFLEX MICROSCOPIC
Bilirubin Urine: NEGATIVE
Ketones, ur: NEGATIVE mg/dL
Nitrite: NEGATIVE
Protein, ur: 30 mg/dL — AB
Urobilinogen, UA: 0.2 mg/dL (ref 0.0–1.0)

## 2010-08-27 LAB — CORTISOL: Cortisol, Plasma: 24.5 ug/dL

## 2010-08-27 LAB — OVA AND PARASITE EXAMINATION: Ova and parasites: NONE SEEN

## 2010-08-27 LAB — STOOL CULTURE

## 2010-08-27 LAB — OSMOLALITY, URINE: Osmolality, Ur: 438 mOsm/kg (ref 390–1090)

## 2010-08-27 LAB — HEPATITIS PANEL, ACUTE
HCV Ab: NEGATIVE
Hep A IgM: NEGATIVE

## 2010-08-27 LAB — APTT: aPTT: 94 seconds — ABNORMAL HIGH (ref 24–37)

## 2010-08-27 LAB — SODIUM, URINE, RANDOM: Sodium, Ur: <15 mEq/L

## 2010-10-06 NOTE — Op Note (Signed)
Janice [name], Janice [name]                ACCOUNT NO.:  [id number]   MEDICAL RECORD NO.:  [id number]          PATIENT TYPE:  AMB   LOCATION:  SDS                          FACILITY:  [facility]   PHYSICIAN:  Alford Highland. Rankin, M.D.   DATE OF BIRTH:  07-Dec-1921   DATE OF PROCEDURE:  08/02/2007  DATE OF DISCHARGE:  08/02/2007                               OPERATIVE REPORT   PREOPERATIVE DIAGNOSIS:  Macular hole, left eye, stage III.   POSTOPERATIVE DIAGNOSIS:  Macular hole, left eye, stage III.   PROCEDURE:  1. Posterior vitrectomy with membrane peel, internal limiting      membrane, 25 gauge.  2. Injection of vitreous substance, C3F8 at 8%.   SURGEON:  Alford Highland. Rankin, M.D.   ANESTHESIA:  Local retrobulbar with monitoring anesthesia control.   INDICATIONS FOR PROCEDURE:  Patient is an 77 year old woman with  profound visual loss in her left eye on the basis of stage III macular  hole.  The patient understands this is an attempt to close the macular  hole so as to allow for visual acuity improvement to maximal ability.  Patient understands the risks of anesthesia, including the rare  occurrence of death, loss to the eye, including but not limited to  hemorrhage, infection, scarring, need for further surgery, no change in  vision, loss in vision, progressive disease despite intervention.  Appropriate signed consent was obtained.  The patient was taken to the  operating room.  In the operating room, appropriate monitoring was  followed by mild sedation.  Xylocaine 2% injected 5 cc retrobulbar for  an additional 5 cc lateral in a fashion-modified [employer]  The left  periocular region was sterilely prepped and draped in the usual sterile  fashion.  A lid speculum applied.  A 25 gauge trocar was placed in the  infratemporal quadrant.  A superior trocar was applied.  Core vitrectomy  was then begun.  The physician induced posterior vitreous attachments in  this left eye and was carried out anterior to  the equator at 360  degrees.  Vitreous scar was trimmed at 360 degrees.  Fluid-air exchange  completed.  A small amount of fluid left over the posterior pole and  over this, diluted ICG dye, green, was then placed overlying the  posterior pole and immediately aspirated with a soft-tip silicone  needle.  This nicely stained the internal limiting membrane.   At this time, an air-fluid exchange completed.  Under fluid, a 25 gauge  forceps were then used to gauge the internal limiting membrane, which  was removed in continuous sheaths.  All tractions on the edges of the  macular hole were clearly removed.  An area of __________  of  approximately 1.5 disk diameters around the fovea was carried out 360  degrees.  No complications occurred.  At this time, a fluid-air exchange  completed.  Thereafter, an air C3F8 at 8%  exchange completed.  At this time, the superior trocars were removed.  The infusion removed.  Excellent intraocular pressures was assessed.  Subconjunctival Decadron applied.  A sterile patch and Fox shield  applied.  Patient was taken to the short stay where she will be  discharged home as an outpatient.      Alford Highland Rankin, M.D.  Electronically Signed     GAR/MEDQ  D:  08/02/2007  T:  08/02/2007  Job:  [id number]

## 2010-10-09 NOTE — H&P (Signed)
Janice [name], Janice [name] NO.:  [id number]   MEDICAL RECORD NO.:  [id number]          PATIENT TYPE:  INP   LOCATION:  1828                         FACILITY:  [facility]   PHYSICIAN:  Lonia Blood, M.D.      DATE OF BIRTH:  [birth year]   DATE OF ADMISSION:  11/02/2005  DATE OF DISCHARGE:                                HISTORY & PHYSICAL   CARDIOLOGIST:  Jaclyn Prime. Lucas Mallow, M.D.   PRESENTING COMPLAINT:  Dizziness and bradycardia.   HISTORY OF PRESENT ILLNESS:  Patient is an 77 year old female with extensive  cardiac history, including atrial fibrillation and mitral valve replacement  surgery in 2004.  The patient was in cardiac rehabilitation this morning,  exercising, when she started feeling dizzy and her heart rate dropped into  the 30s.  She was subsequently transferred to the emergency room.  Dr. Lucas Mallow  has been informed.  Due to the patient's cardiac history and multiple pauses  seen on her tracings, she is being admitted for possible pacemaker  placement.  Patient denies any symptoms right now.  No chest pain.  No  diaphoresis.  No cough.  No shortness of breath.   PAST MEDICAL HISTORY:  Significant for atrial fibrillation.  She is on  chronic Coumadin therapy.  History of severe mitral regurgitation and severe  tricuspid regurgitation.  Status post mitral valve replacement and tricuspid  annuloplasty as well as a Maze procedure performed on February 18, 2003 at  [facility].  Hypotension.  Hypertension.  Dyslipidemia.  Status post  hysterectomy and cholecystectomy.  Status post right hip replacement.   ALLERGIES:  Patient is allergic to CODEINE and SULFA.   MEDICATIONS:  Currently takes Aleve as needed.  Coumadin.  Maxzide 7.5/50 mg  1 tablet daily as needed.  Synthroid 0.05 mg daily.  Vitamin B 100 mg daily.  Zebeta 2.5 mg daily.  Zocor 5 mg daily.   SOCIAL HISTORY:  Patient lives alone at home.  Denied any tobacco use.  Occasionally takes wine with dinner.  She  has a cat but no family, nuclear,  around.   FAMILY HISTORY:  Not significant at this point.   REVIEW OF SYSTEMS:  A 12-point review of systems is performed and  essentially is as per HPI.  Other systems are negative.   PHYSICAL EXAMINATION:  VITAL SIGNS:  Temperature 96.9, blood pressure  125/54, pulse ranging from 30s to 59, respiratory rate 16, sats 99% on room  air.  GENERAL:  Patient is awake, alert and oriented, very pleasant, in no acute  distress.  HEENT:  PERRLA.  EOMI.  NECK:  Supple.  No JVD.  No lymphadenopathy.  RESPIRATORY:  Good air entry bilaterally with no wheezes or rales.  CARDIOVASCULAR:  Patient is bradycardic and irregular rhythm.  ABDOMEN:  Soft and nontender with positive bowel sounds.  EXTREMITIES:  No clubbing, cyanosis or edema.   Current labs showed a white count of 3.8, hemoglobin 10.8, platelet count  155.  Her PT is 30.7 with an INR of 2.9.   A 12-lead EKG, rhythm strip EKG here shows severe  sinus tachycardia with  multiple pauses, at least 3-4 seconds.   ASSESSMENT:  1.  This is an 77 year old female with profound bradycardia in the setting      of known cardiac disease.  The patient had symptomatic bradycardia.  She      will definitely benefit from some type of pacing.  She also will      definitely need some EP involvement.  The plan therefore will be to      admit her onto a telemetry bed and get EP involved.  In the meantime, I      will hold her bisoprolol and no other beta blockers or rate-limiting      medications.  Will continue with the Coumadin and await involvement of      the EP group.  2.  Atrial fibrillation:  Will continue with Coumadin.  Patient will not      need any rate-controlling medication at this point due to her      bradycardia.  3.  Hypothyroidism:  I will continue with her Synthroid.  Check a TSH level      also.  4.  Hypertension:  Patient can get by with Maxzide at this point, but her      blood pressure looks  great.  5.  Dyslipidemia:  Continue with Zocor as per home dose.  6.  Normocytic anemia:  Patient denied any bleeding.  I will check stool      guaiacs since she is on Coumadin.  Also check ferritin, B12, and folate      levels.   Otherwise will proceed per cardiology.      Lonia Blood, M.D.  Electronically Signed     LG/MEDQ  D:  11/02/2005  T:  11/02/2005  Job:  [id number]

## 2010-10-09 NOTE — H&P (Signed)
NAME:  [name], [name] NO.:  [id number]   MEDICAL RECORD NO.:  [id number]          PATIENT TYPE:  INP   LOCATION:  3731                         FACILITY:  [facility]   PHYSICIAN:  Merlene Laughter. Renae Gloss, M.D.DATE OF BIRTH:  29-Jul-1921   DATE OF ADMISSION:  12/25/2005  DATE OF DISCHARGE:                                HISTORY & PHYSICAL   PRIMARY CARE PHYSICIAN/CARDIOLOGY:  Dr. Lucas Mallow.   This patient is a full code.   CHIEF COMPLAINT:  Right lower leg varicose bleeding.   HISTORY OF PRESENT ILLNESS:  Janice [name] is an 77 year old lady who  experienced copious bleeding from a varicose vein in her right lower leg.  She states that the blood appeared to fill her bathtub. In addition, she  states she left a puddle of blood on the floor when she got out of the tub.  She was unable to stop the bleeding with compression. When she arrived in  the emergency department, she was unsteady and had a near syncopal episode  with a standing position. The bleeding, however, had stopped. Janice [name] is  on chronic Coumadin for atrial fibrillation. She states that PT/INR was  obtained on Thursday, December 23, 2005, but she does not know the results of  this test. PT/INR at this time in the ED is pending.   PAST MEDICAL HISTORY:  1.  Atrial fibrillation with mitral valve replacement in 2004.  2.  History of bradycardia with pacemaker placement in June 2007.  3.  Hypertension.  4.  Hyperlipidemia.  5.  Hypothyroidism.  6.  Status post right total hip replacement.  7.  Status post TAH.  8.  Status post cholecystectomy.   SOCIAL HISTORY:  Janice [name] lives alone, and she is independent. She denies  tobacco or alcohol.   DRUG ALLERGIES:  CODEINE AND SULFA.   MEDICATIONS:  1.  Coumadin 5 mg p.o. every other day alternating with 3.75 mg p.o. every      other day.  2.  Zocor 5 mg p.o. daily.  3.  Synthroid 50 mcg p.o. daily.  4.  Zebeta 2.5 mg p.o. daily.  5.  Multivitamin with  iron.   FAMILY HISTORY:  Noncontributory.   REVIEW OF SYSTEMS:  As per HPI. Greater than 10 systems are reviewed.   PHYSICAL EXAMINATION:  GENERAL APPEARANCE:  Well-developed, well-nourished,  elderly white female in no acute distress, lying comfortably in bed.  VITAL SIGNS:  Temperature 96.9, pulse 96, respirations 18, blood pressure  114/58, O2 saturation on room air 97%.  HEENT:  No oropharyngeal lesions.  NECK:  Supple, no masses, 2+ carotids, no bruits.  LUNGS:  Clear to auscultation bilaterally.  HEART:  S1 and S2, paced rhythm.  ABDOMEN:  Soft, nontender, nondistended, positive bowel sounds.  EXTREMITIES:  No clubbing, cyanosis, or edema. Diffuse bruises throughout  both extremities.  SKIN:  Warm, intact.  NEUROLOGICAL:  Alert and oriented x3. Cranial nerves intact.   LABORATORY DATA:  At midnight, hemoglobin is 9.4.  At 3:34, hemoglobin is  8.4. PT and INR are pending.   ASSESSMENT AND PLAN:  1.  Presyncope, most likely due to acute blood loss anemia. Ms. Bayless will      be admitted to telemetry and transfused. Serial CBCs will be obtained as      well. Suspect a supratherapeutic Coumadin level. We are awaiting PT/INR      results. Coumadin will be held, of course. Ms. Ang has no acute      bleeding at this time.  2.  Hypertension. Well controlled with Zebeta.  3.  Atrial fibrillation. The patient has paced rhythm and has no pacemaker      complications at this time.           ______________________________  Merlene Laughter Renae Gloss, M.D.     KRS/MEDQ  D:  12/25/2005  T:  12/25/2005  Job:  [id number]   cc:   Jaclyn Prime. Lucas Mallow, M.D.

## 2010-10-09 NOTE — Discharge Summary (Signed)
Janice [name], SCHARF NO.:  [id number]   MEDICAL RECORD NO.:  [id number]          PATIENT TYPE:  INP   LOCATION:  2015                         FACILITY:  [facility]   PHYSICIAN:  Michaelyn Barter, M.D. DATE OF BIRTH:  1922/02/09   DATE OF ADMISSION:  11/02/2005  DATE OF DISCHARGE:  11/04/2005                                 DISCHARGE SUMMARY   PRIMARY CARE PHYSICIAN:  Dr. Jaclyn Prime. [place].   FINAL DIAGNOSES:  1. Symptomatic bradycardia.  2. Atrial fibrillation.  3. Hypertension.  4. Anemia.   PROCEDURES:  1. Insertion of a dual chamber permanent transvenous pacemaker, DDDR mode      by Dr. Charolette Child on June13,2007.  2. Portable chest x-ray completed on June12,2007 and June14,2007.   HISTORY OF PRESENT ILLNESS:  Ms. Knack is an 77 year old female who was in  cardiac rehabilitation on the morning of her admission. She was exercising  and shortly afterwards started feeling dizzy. Her heart rate dropped into  the 30s. She was transferred to the ER, Dr. Lucas Mallow was informed.   For past medical history, please see that dictated by Dr. Lonia Blood, on  June12,2007.   #1.  Symptomatic Bradycardia.  The patient has a history of cardiac  problems,  and was noted to have multiple pauses on her tracing.  On June  13, Dr. Charolette Child inserted a dual-chamber permanent transvenous  pacemaker, DDDR mode. The procedure appears to have gone without any  complications. According to cardiology's note, the patient appeared to be  stable with pacemaker insertion. They indicated that the pacer appeared to  be functioning normally and in the DDD mode and indicated that they would  followup with the patient in their office within 1 week.  They also went on  to state that the patient could be discharge from the hospital. By June 14,  the patient indicated that she felt pretty good and she requested to be  discharged on that particular date.  #2.  Atrial fibrillation.  The patient  actually sounded as though she was in  sinus rhythm throughout the course of her hospitalization. Her Coumadin was  restarted on the day of her discharge from the hospital.  #3.  History of hypertension.  The patient's blood pressure remained stable  throughout the course of her hospitalization.  #4.  Anemia.  The patient appeared to be anemic with a hemoglobin of 11.3 on  June14,2007. She showed no obvious  signs of bleeding. The patient's condition at the time of discharge appeared  to be stable.  The patient indicated that she was ready to be discharged  home by [id number].  Therefore she was discharged home. On June 14, the patient  was discharged home.      Michaelyn Barter, M.D.  Electronically Signed     OR/MEDQ  D:  12/17/2005  T:  12/18/2005  Job:  [id number]

## 2010-10-09 NOTE — Discharge Summary (Signed)
NAME:  Janice [name], Janice [name] NO.:  [id number]   MEDICAL RECORD NO.:  [id number]          PATIENT TYPE:  INP   LOCATION:  3731                         FACILITY:  [facility]   PHYSICIAN:  Michaelyn Barter, M.D. DATE OF BIRTH:  25-Jun-1921   DATE OF ADMISSION:  12/25/2005  DATE OF DISCHARGE:  12/26/2005                                 DISCHARGE SUMMARY   PRIMARY CARE PHYSICIAN:  Patient's primary care physician is Dr. Aggie Cosier.   FINAL DIAGNOSES:  1. Presyncope.  2. Acute blood loss, anemia.  3. Bleeding from distal varicose vein in the right lower extremity.   PROCEDURES:  Transfusion of one unit of packed RBCs.   HISTORY OF PRESENT ILLNESS:  The patient is an 76 year old female who  experienced bleeding from a varicose vein in her right lower extremity.  She  stated that she had experienced an episode of bleeding from her distal leg  earlier in the week and she went to see her primary care physician, Dr.  Lucas Mallow, and there was some concern at that time that the Coumadin may have  been associated with the bleeding.  There was some consideration with  regards to decreasing her dosage of Coumadin; however, the bleeding stopped.  Later in the week, on the date of this admission, it restarted.  This time,  she was in the bathtub and she stated that the blood appeared to fill her  bathtub.  She also produced a puddle of blood on the floor when she got out  of the tub.  She applied pressure to the bleeding site; however, it would  cease.  She went on to state that she became lightheaded and felt as though  she was going to pass out.  EMS was summoned and the patient was brought to  the hospital for further evaluation.   1. Acute blood loss secondary to bleeding from a distal leg varicose vein.      When the patient arrived into the hospital, it was noted that she had      been taking Coumadin.  Her INR, at the time of her admission, was not      to have been only 2.8;  however, her hemoglobin was noted to have been      8.8.  Her Coumadin was held the first day of her admission into the      hospital.  Her distal leg bleeding site was wrapped in a bandage.  She      received 1 unit of packed RBCs over the course of her hospitalization.      She stated that her symptoms of feeling as though she was going to pass      out have resolved.  Later, on the same date of her admission, that she      actually indicated that she wanted to be discharged on the date of her      admission.  She was asked to stay for close observation.  Her      hemoglobin was followed and she appears to have had a positive response  with a hemoglobin of 10.0 as of today, December 26, 2005.  She indicated      that the bleeding had ceased from her lower extremity.  She never      complained of any pain and again there were no repeat presyncopal like      symptoms.  2. History of atrial fibrillation/mitral valve placement.  Again, the      patient was placed on Coumadin secondary to this.  The patient's      Coumadin was held the day of her admission.  Her INR was checked today,      December 26, 2005, and was found to be 2.1 despite the fact that the      Coumadin was held.  3. Hypertension.  This has remained stable throughout the course of the      patient's hospitalization.  4. Presyncopal episodes.  This was believed to be secondary to the acute      blood loss, anemia that the patient experienced secondary to the bleed      being that occurred from her distal leg.  By the time of the patient's      discharge, she appeared to be stable.  The patient indicated that she      was ready to go home and actually had the nurses page me and tell me to      come and discharge her.  The patient was discharged home on Coumadin.      She      was told to take a decreased dose of 2.5 mg once a day.  She was also      instructed to call Dr. Lucas Mallow for a followup appointment, at which time      he  and she can discuss whether or not she needs to remain on Coumadin      and whether or not her dose needs to be permanently decreased.      Michaelyn Barter, M.D.  Electronically Signed     OR/MEDQ  D:  12/26/2005  T:  12/26/2005  Job:  [id number]   cc:   Jaclyn Prime. Lucas Mallow, M.D.

## 2010-10-09 NOTE — Cardiovascular Report (Signed)
NAME:  Janice [name], Janice [name] NO.:  [id number]   MEDICAL RECORD NO.:  [id number]          PATIENT TYPE:  INP   LOCATION:  2015                         FACILITY:  [facility]   PHYSICIAN:  Antionette Char, MD    DATE OF BIRTH:  10/10/1921   DATE OF PROCEDURE:  11/03/2005  DATE OF DISCHARGE:                              CARDIAC CATHETERIZATION   REFERRING PHYSICIAN:  Dr. Aggie Cosier   SURGEON:  Antionette Char, MD.   PROCEDURE:  Insertion of dual-chamber permanent transvenous pacemaker, DDDR  mode.   INDICATIONS FOR PROCEDURE:  This 77 year old female with a history of mitral  valve disease and is status post mitral valve replacement.  She has also a  history of atrial fibrillation.  She has been on Coumadin and for protection  from the prosthetic valve and her atrial fibrillation.  She presented to  follow on this hospitalization with dizziness and severe bradycardia with a  heart rate in the 30s.  She was then scheduled for pacemaker placement  because of the symptomatic severe brady arrhythmia.  Her Coumadin was  stopped and she was given vitamin K intravenously yesterday and her protime  decreased from 30 seconds to 15 seconds today.  Therefore, we were able to  proceed with the insertion today.   PROCEDURE:  After signing an informed consent, the patient was premedicated  with 5 mg of Valium by mouth and brought to the cardiac catheterization lab  at [facility]. [facility].  Her right anterior chest and base of  neck were prepped and draped in a sterile fashion and a right transverse  subclavicular plane was anesthetized locally with 1% lidocaine.  An incision  was made in this anesthetized plane with the incision being deepened into  the fascial layer overlying the pectoralis muscle.  A  pocket was then  formed in this fascial layer for later insertion of the pulse generator. An  8-French Cook introducer sheath was then inserted percutaneously into the  right  subclavian vein with the Seldinger wire being easily passed into the  superior vena cava.  A [employer] ventricular bipolar lead was then  selected, model number 1336T, serial number [id number] and after inserting  through the [facility] introducer sheath, it  was advanced to the superior vena  cava.  The sheath was removed in the usual fashion.  The ventricular lead  was then positioned in the right atrium where we obtained an atrial  endocardial electrogram and found a regular atrial rhythm or sinus rhythm  and therefore elected to proceed with a dual-chamber pacer.  We then  inserted a 7-French Cook introducer sheath percutaneously into the right  subclavian vein, again with a Seldinger wire being easily passed into the  superior vena cava.  We then selected a screw-in lead for use in the atrium  made by [facility]. [employer], model number 1788C/46cm, serial number [id number]  and after inserting this through the 8-French [facility] introducer sheath, it was  advanced to the superior vena cava.  The second sheath was removed in the  usual fashion.  We then advanced the ventricular lead into the right atrium  and right ventricle and the tip was positioned in the apex of the right  ventricle.  Very good pacing parameters were obtained with a minimum voltage  threshold of 0.3 volts utilizing 0.5 mA of current.  The R wave sensitivity  measured 25 mV.  After obtaining these ventricular pacing parameters, the  screw-in lead was then advanced into the right atrium in using a J guidewire  it was positioned medially in the right atrial appendage.  After testing  several sites, we found a very good atrial pacing site with a P-wave  sensitivity of 2 mV and a minimum voltage threshold of 0.5 volts utilizing  0.8 mA of current.  After obtaining these pacing parameters, the leads were  secured properly at their insertion site using 1-0 silk.  The wound was  lavaged profusely with a kanamycin solution.  We then  selected a pacemaker  pulse generator made by [facility]. [employer] model Victory XL DR model [phone]  serial # [id number] and after properly analyzing the pulse generator, it was  attached to the pacing electrodes in the usual fashion.  Pulse generator was  then placed within the previously formed pocket and the wound was closed in  layers using 2-0 Dexon.  Final skin closure was obtained with a cutaneous  layer of Steri-Strips.  The patient tolerated the procedure well and no  complications were noted at the end of the procedure.  A sterile bulky  dressing was applied to the wound and she was returned to her room in  satisfactory condition.   MEDICATIONS GIVEN:  None.   The pacemaker is noted to be functioning normally in the DDD mode.  Wound  care instructions were given.      Antionette Char, MD  Electronically Signed     JRT/MEDQ  D:  11/03/2005  T:  11/03/2005  Job:  [id number]   cc:   Jaclyn Prime. Lucas Mallow, M.D.  Fax: [phone]

## 2010-11-06 ENCOUNTER — Telehealth: Payer: Self-pay | Admitting: Internal Medicine

## 2010-11-06 NOTE — Telephone Encounter (Signed)
Spoke with pt episode mentioned in message  Lasted from 6/12-13/12 went to church wed evening feels better at this time appt made for 12/28/10 at 2:15 pm  With Dr Raquel James stated will call back if has another episode will forward to Dr Ladona Ridgel for review./cy

## 2010-11-06 NOTE — Telephone Encounter (Signed)
Pt had reaction to amoxicyline before having dental procedure, had a chill, sob, rapid heart rate, and shaking, told dr Jason Fila who suggested it may have been her a-fib and to call dr taylor and let him know what happened

## 2010-11-06 NOTE — Telephone Encounter (Signed)
Pt returning your call

## 2010-11-11 ENCOUNTER — Encounter: Payer: Self-pay | Admitting: Internal Medicine

## 2010-12-28 ENCOUNTER — Ambulatory Visit (INDEPENDENT_AMBULATORY_CARE_PROVIDER_SITE_OTHER): Payer: Medicare Other | Admitting: Internal Medicine

## 2010-12-28 ENCOUNTER — Encounter: Payer: Self-pay | Admitting: Internal Medicine

## 2010-12-28 DIAGNOSIS — I471 Supraventricular tachycardia: Secondary | ICD-10-CM

## 2010-12-28 DIAGNOSIS — I509 Heart failure, unspecified: Secondary | ICD-10-CM

## 2010-12-28 DIAGNOSIS — I4891 Unspecified atrial fibrillation: Secondary | ICD-10-CM

## 2010-12-28 NOTE — Patient Instructions (Addendum)
Your physician wants you to follow-up in: 12 months with Dr. Taylor. You will receive a reminder letter in the mail two months in advance. If you don't receive a letter, please call our office to schedule the follow-up appointment.    

## 2010-12-29 ENCOUNTER — Encounter: Payer: Self-pay | Admitting: Internal Medicine

## 2010-12-29 NOTE — Assessment & Plan Note (Signed)
She appears to be well rate controlled. She will continue her current meds.

## 2010-12-29 NOTE — Progress Notes (Signed)
HPI Janice [name] returns today for followup. She is a pleasant 77 yo woman with a h/o symptomatic bradycardia, HTN, and DM. Her biggest complaints are due to arthritis. She denies syncope, c/p, sob or peripheral edema. No Known Allergies   Current Outpatient Prescriptions  Medication Sig Dispense Refill  . bisoprolol (ZEBETA) 10 MG tablet Take 10 mg by mouth 2 (two) times daily.        . Calcium Carbonate-Vitamin D (CALCIUM 500/VITAMIN D PO) Take 1 tablet by mouth 2 (two) times daily.        . folic acid (FOLVITE) 1 MG tablet Take 1 mg by mouth daily.        . furosemide (LASIX) 20 MG tablet Take 20 mg by mouth daily.        Marland Kitchen levothyroxine (SYNTHROID, LEVOTHROID) 50 MCG tablet Take 50 mcg by mouth daily.        . methotrexate (RHEUMATREX) 2.5 MG tablet Caution:Chemotherapy. Protect from light.  Take 3 tablets by mouth per week.       . Multiple Vitamins-Minerals (OCUVITE PO) Take 1 tablet by mouth daily.        Marland Kitchen omeprazole (PRILOSEC) 20 MG capsule Take 20 mg by mouth daily.        . potassium chloride SA (K-DUR,KLOR-CON) 20 MEQ tablet Take 20 mEq by mouth daily.        . predniSONE (DELTASONE) 10 MG tablet Take 10 mg by mouth daily. 1 and 1/2 po daily      . simvastatin (ZOCOR) 5 MG tablet Take 5 mg by mouth at bedtime.        Marland Kitchen warfarin (COUMADIN) 2 MG tablet Use as directed by the Anticoagulation Clinic.          Past Medical History  Diagnosis Date  . Hypokalemia   . GERD (gastroesophageal reflux disease)   . Temporal arteritis   . Atrial fibrillation   . Hypothyroidism   . Hyponatremia     ROS:   All systems reviewed and negative except as noted in the HPI.   Past Surgical History  Procedure Date  . Mv repair     at [facility]  . Ppm      No family history on file.   History   Social History  . Marital Status: Single    Spouse Name: N/A    Number of Children: N/A  . Years of Education: N/A   Occupational History  . Not on file.   Social History Main  Topics  . Smoking status: Never Smoker   . Smokeless tobacco: Not on file  . Alcohol Use: Not on file  . Drug Use: Not on file  . Sexually Active: Not on file   Other Topics Concern  . Not on file   Social History Narrative  . No narrative on file     BP 119/53  Pulse 69  Resp 16  Ht 5\' 7"  (1.702 m)  Wt 105 lb (47.628 kg)  BMI 16.45 kg/m2  Physical Exam:  Elderly, but well appearing NAD HEENT: Unremarkable Neck:  No JVD, no thyromegally Lymphatics:  No adenopathy Back:  No CVA tenderness Lungs:  Clear with no wheezes, rales, or rhonchi HEART:  Regular rate rhythm, no murmurs, no rubs, no clicks Abd:  soft, positive bowel sounds, no organomegally, no rebound, no guarding Ext:  2 plus pulses, no edema, no cyanosis, no clubbing Skin:  No rashes no nodules Neuro:  CN II through XII intact, motor grossly  intact  DEVICE  Normal device function.  See PaceArt for details.   Assess/Plan:

## 2010-12-29 NOTE — Assessment & Plan Note (Signed)
Her symptoms are class 2. SHe will continue her current meds. I have asked her to maintain a low sodium diet.

## 2011-02-06 IMAGING — CR DG CHEST 1V PORT
1 series · 1 of 1 positions shown · non-contrast
Comparison: 11/04/2005

CLINICAL DATA: Fever, weakness

PORTABLE CHEST - 1 VIEW

[view not recorded]
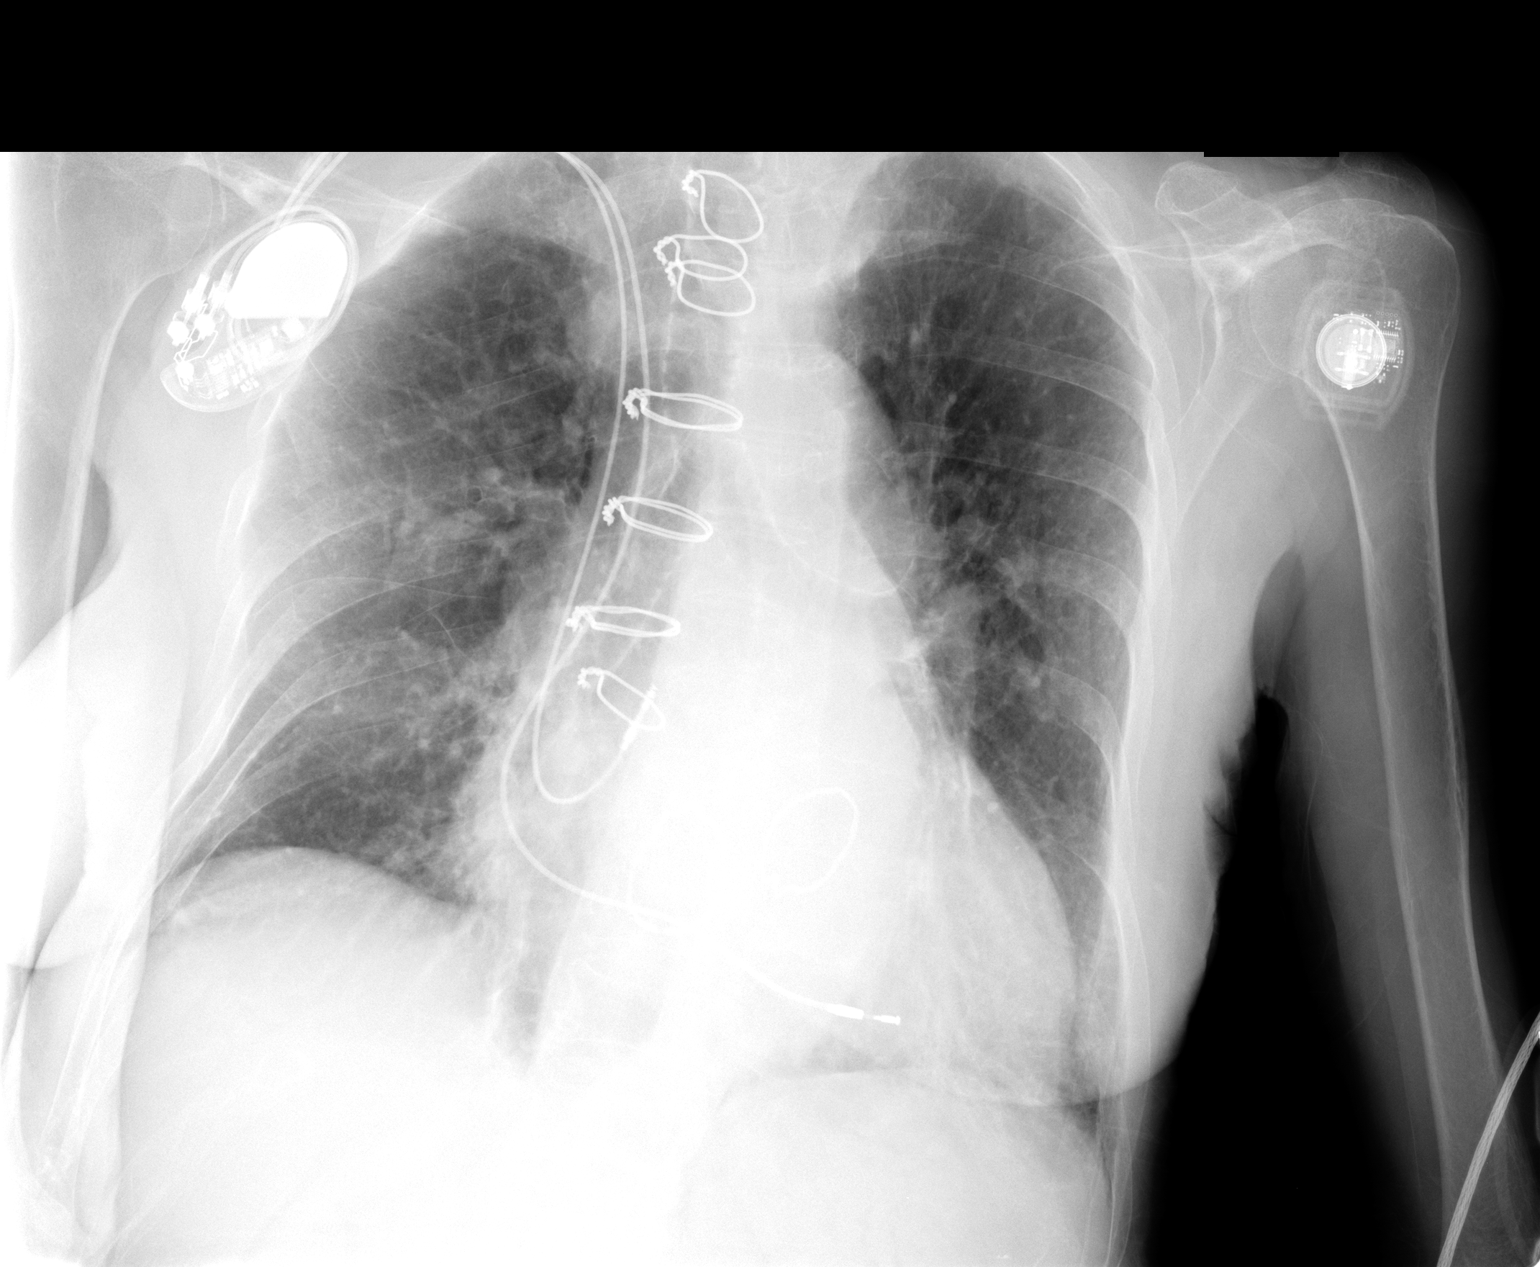

[1 of 1 positions shown; findings below may reference images not displayed]

FINDINGS: Cardiac enlargement status post median sternotomy and bivalvular
replacement.
Calcified tortuous aorta.
Slight pulmonary vascular congestion.
Right subclavian transvenous pacemaker leads project over right
atrium and right ventricle.
Underlying COPD.
No gross failure or pleural effusion.
Mild atelectasis or developing infiltrate in the medial lung bases
bilaterally.
IMPRESSION: Cardiomegaly status post bivalvular replacement and pacemaker.
COPD with developing atelectasis versus infiltrate in the medial
lung bases.

## 2011-02-08 IMAGING — US US ABDOMEN COMPLETE
1 series · 14 of 25 positions shown · non-contrast
Comparison: None.

CLINICAL DATA: Coagulopathy, diarrhea

COMPLETE ABDOMINAL ULTRASOUND

[Series 1: us abdomen complete · 0.30mm/px · 14 of 61 slices shown]
[im 1/61]
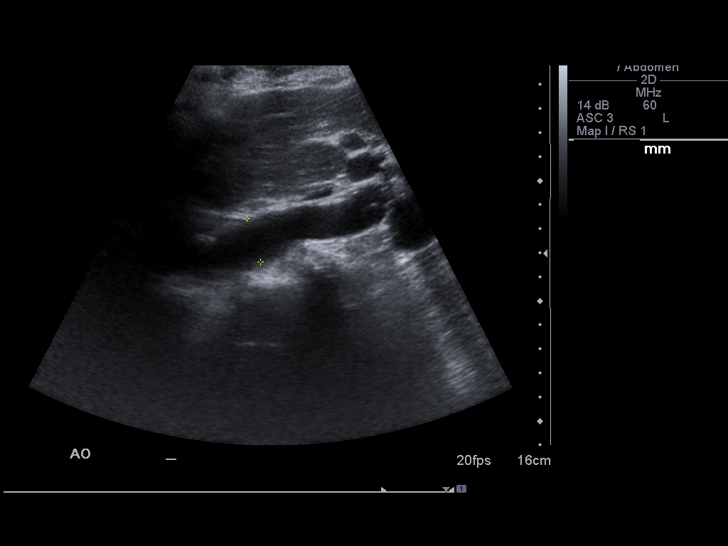
[im 6/61]
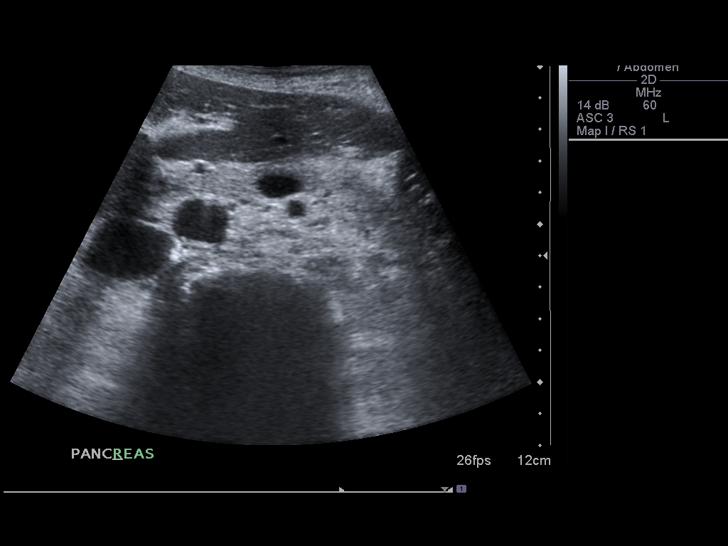
[im 11/61]
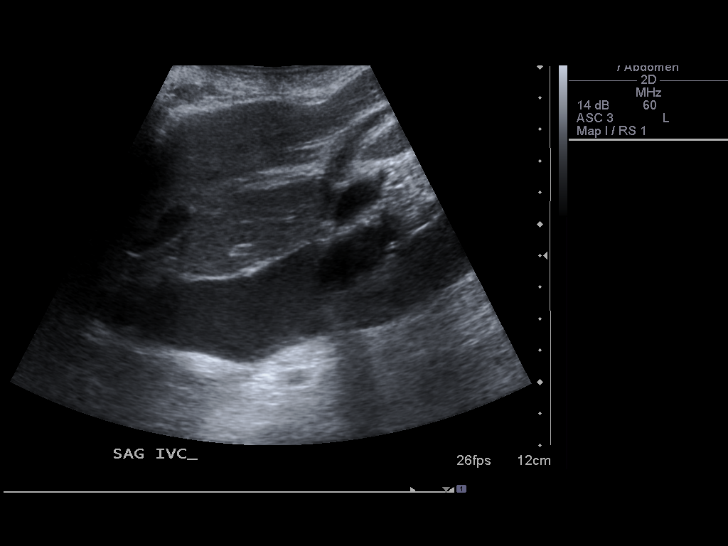
[im 16/61]
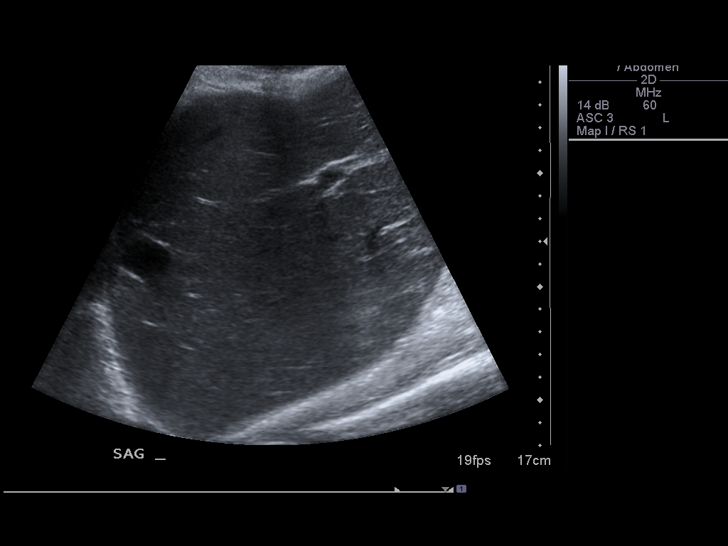
[im 21/61]
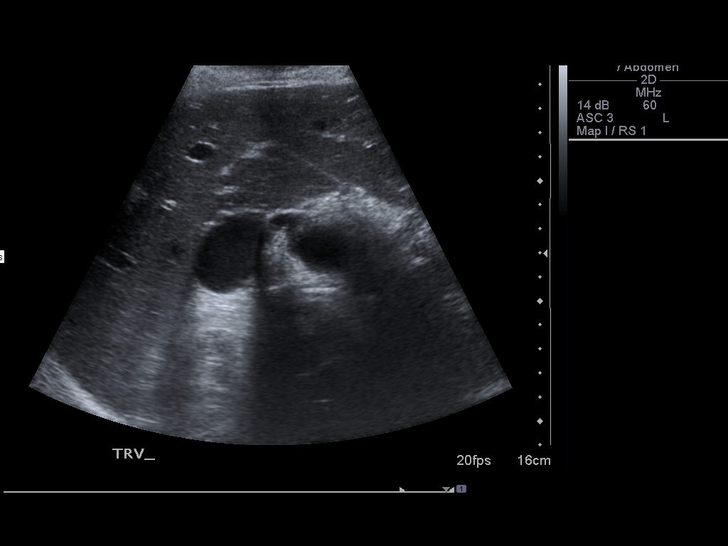
[im 23/61]
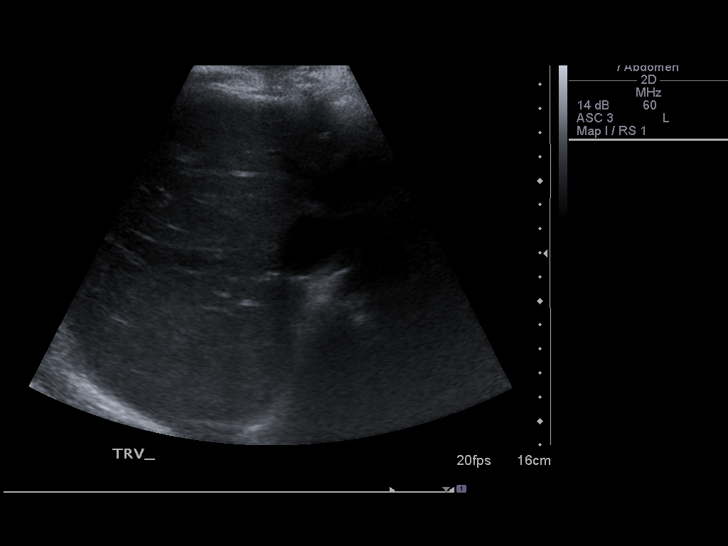
[im 28/61]
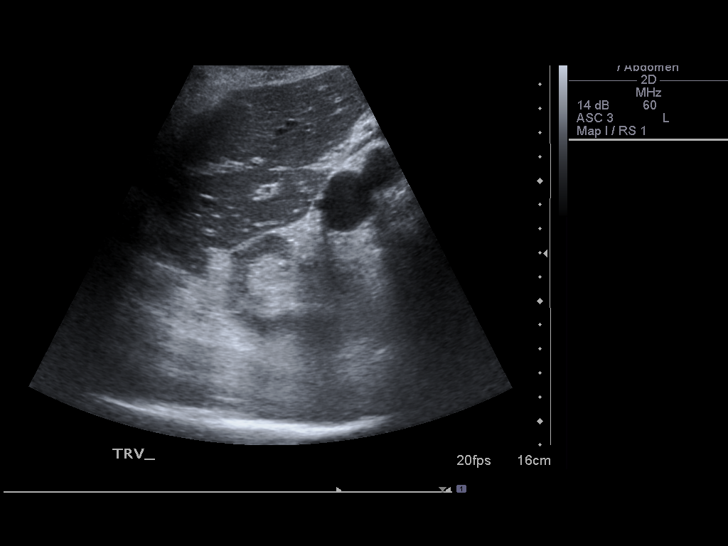
[im 33/61]
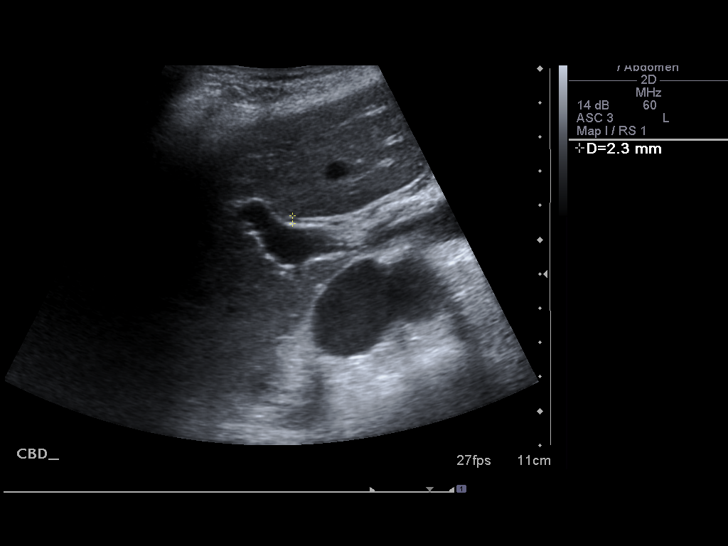
[im 38/61]
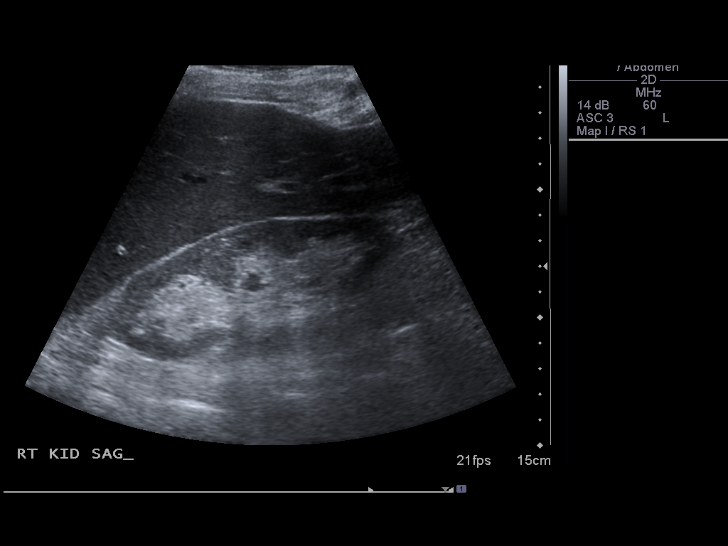
[im 41/61]
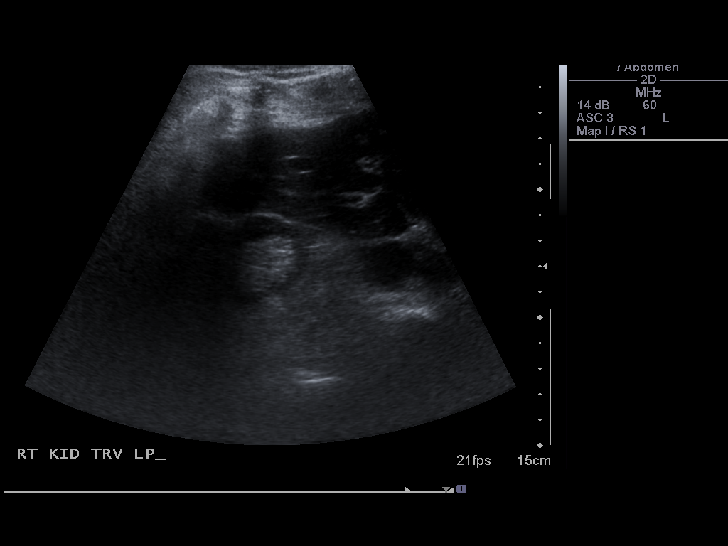
[im 46/61]
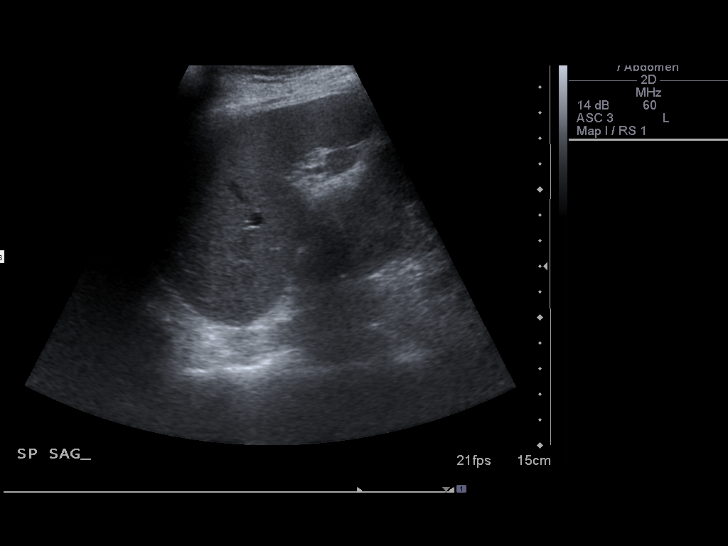
[im 51/61]
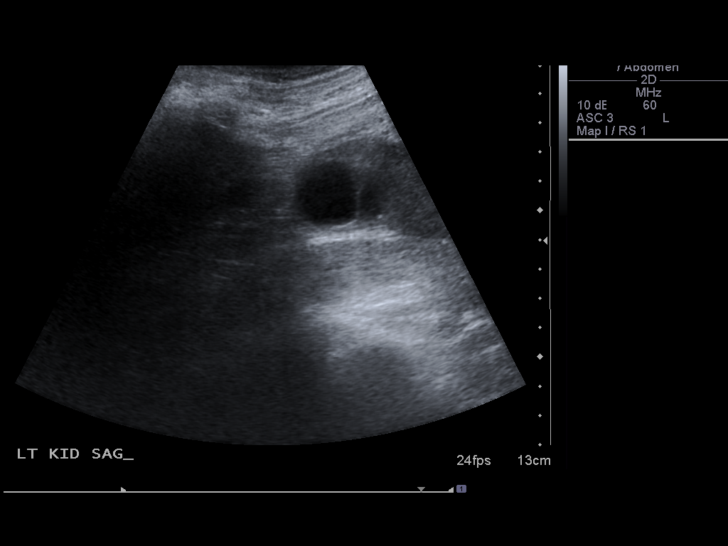
[im 56/61]
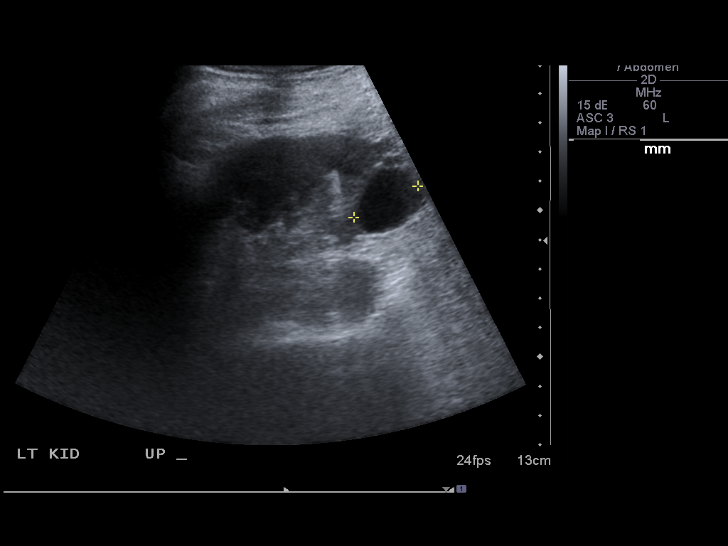
[im 61/61]
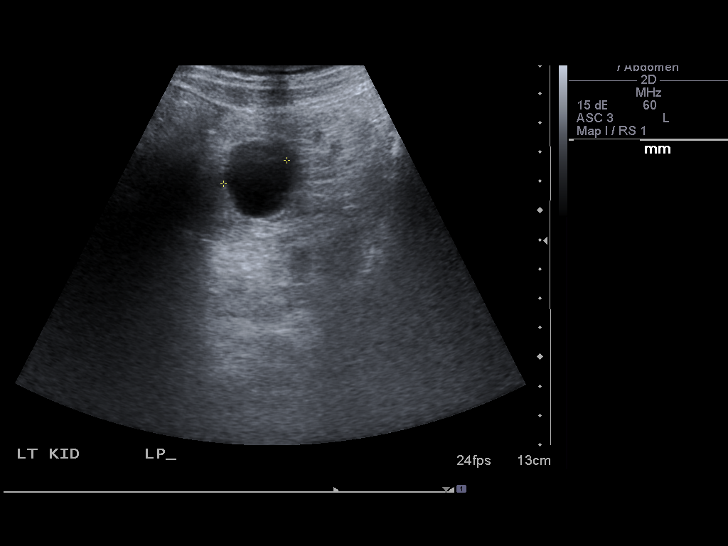

[14 of 25 positions shown; findings below may reference images not displayed]

FINDINGS: Gallbladder:  The gallbladder has previously been resected.

Common bile duct:  The common bile duct is normal measuring 2.4 mm
in diameter.

Liver:  The liver has a normal echogenic pattern.  No focal
abnormality is seen.

IVC:  Appears normal.

Pancreas:  No focal abnormality seen.

Spleen:  The spleen is normal measuring 7.6 cm sagittally.

Right Kidney:  No hydronephrosis is seen.  The right kidney
measures 12.5 cm sagittally.

Left Kidney:  No hydronephrosis.  The left kidney measures 11.6 cm.
Two left renal cysts are present.  One is in the mid pole of 1.9 x
2.4 x 2.4 cm, with a second in the lower pole of 2.1 x 2.4 x
cm.

Abdominal aorta:  No aneurysm identified.
IMPRESSION: 1.  No ductal dilatation.
2.  Small left renal cysts.
3.  Prior cholecystectomy.

## 2011-02-15 LAB — BASIC METABOLIC PANEL
BUN: 23
GFR calc Af Amer: >60
GFR calc non Af Amer: 59 — ABNORMAL LOW
Potassium: 5
Sodium: 132 — ABNORMAL LOW

## 2011-02-15 LAB — CBC
HCT: 37.4
Hemoglobin: 12.8
Platelets: 195
RBC: 4.16
WBC: 7.5

## 2011-02-15 LAB — PROTIME-INR
INR: 2.2 — ABNORMAL HIGH
Prothrombin Time: 25.2 — ABNORMAL HIGH

## 2011-02-15 LAB — APTT: aPTT: 46 — ABNORMAL HIGH

## 2011-04-01 ENCOUNTER — Encounter: Payer: Self-pay | Admitting: Internal Medicine

## 2011-04-01 DIAGNOSIS — I495 Sick sinus syndrome: Secondary | ICD-10-CM

## 2011-05-26 ENCOUNTER — Other Ambulatory Visit: Payer: Self-pay

## 2011-05-26 ENCOUNTER — Emergency Department (HOSPITAL_COMMUNITY): Payer: Medicare Other

## 2011-05-26 ENCOUNTER — Inpatient Hospital Stay (HOSPITAL_COMMUNITY)
Admission: EM | Admit: 2011-05-26 | Discharge: 2011-05-29 | DRG: 871 | Disposition: A | Discharge status: Home Health | Payer: Medicare Other | Attending: Internal Medicine | Admitting: Internal Medicine

## 2011-05-26 ENCOUNTER — Encounter (HOSPITAL_COMMUNITY): Payer: Self-pay | Admitting: Emergency Medicine

## 2011-05-26 DIAGNOSIS — Z993 Dependence on wheelchair: Secondary | ICD-10-CM

## 2011-05-26 DIAGNOSIS — I5032 Chronic diastolic (congestive) heart failure: Secondary | ICD-10-CM | POA: Diagnosis present

## 2011-05-26 DIAGNOSIS — W19XXXA Unspecified fall, initial encounter: Secondary | ICD-10-CM | POA: Diagnosis present

## 2011-05-26 DIAGNOSIS — E876 Hypokalemia: Secondary | ICD-10-CM

## 2011-05-26 DIAGNOSIS — I959 Hypotension, unspecified: Secondary | ICD-10-CM

## 2011-05-26 DIAGNOSIS — Z66 Do not resuscitate: Secondary | ICD-10-CM | POA: Diagnosis present

## 2011-05-26 DIAGNOSIS — E039 Hypothyroidism, unspecified: Secondary | ICD-10-CM | POA: Diagnosis present

## 2011-05-26 DIAGNOSIS — J159 Unspecified bacterial pneumonia: Secondary | ICD-10-CM

## 2011-05-26 DIAGNOSIS — J189 Pneumonia, unspecified organism: Secondary | ICD-10-CM | POA: Diagnosis present

## 2011-05-26 DIAGNOSIS — I4891 Unspecified atrial fibrillation: Secondary | ICD-10-CM | POA: Diagnosis present

## 2011-05-26 DIAGNOSIS — Z9889 Other specified postprocedural states: Secondary | ICD-10-CM

## 2011-05-26 DIAGNOSIS — Z7901 Long term (current) use of anticoagulants: Secondary | ICD-10-CM

## 2011-05-26 DIAGNOSIS — Z79899 Other long term (current) drug therapy: Secondary | ICD-10-CM

## 2011-05-26 DIAGNOSIS — E871 Hypo-osmolality and hyponatremia: Secondary | ICD-10-CM

## 2011-05-26 DIAGNOSIS — S72009A Fracture of unspecified part of neck of unspecified femur, initial encounter for closed fracture: Secondary | ICD-10-CM | POA: Diagnosis present

## 2011-05-26 DIAGNOSIS — I509 Heart failure, unspecified: Secondary | ICD-10-CM | POA: Diagnosis present

## 2011-05-26 DIAGNOSIS — IMO0002 Reserved for concepts with insufficient information to code with codable children: Secondary | ICD-10-CM

## 2011-05-26 DIAGNOSIS — J9600 Acute respiratory failure, unspecified whether with hypoxia or hypercapnia: Secondary | ICD-10-CM

## 2011-05-26 DIAGNOSIS — J13 Pneumonia due to Streptococcus pneumoniae: Secondary | ICD-10-CM

## 2011-05-26 DIAGNOSIS — J9691 Respiratory failure, unspecified with hypoxia: Secondary | ICD-10-CM | POA: Diagnosis present

## 2011-05-26 DIAGNOSIS — A419 Sepsis, unspecified organism: Principal | ICD-10-CM | POA: Diagnosis present

## 2011-05-26 DIAGNOSIS — K219 Gastro-esophageal reflux disease without esophagitis: Secondary | ICD-10-CM | POA: Diagnosis present

## 2011-05-26 DIAGNOSIS — M316 Other giant cell arteritis: Secondary | ICD-10-CM | POA: Diagnosis present

## 2011-05-26 DIAGNOSIS — Z515 Encounter for palliative care: Secondary | ICD-10-CM

## 2011-05-26 HISTORY — DX: Shortness of breath: R06.02

## 2011-05-26 HISTORY — DX: Angina pectoris, unspecified: I20.9

## 2011-05-26 LAB — POCT I-STAT 3, ART BLOOD GAS (G3+)
Acid-Base Excess: 1 mmol/L (ref 0.0–2.0)
Bicarbonate: 24.9 mEq/L — ABNORMAL HIGH (ref 20.0–24.0)
Patient temperature: 103.3

## 2011-05-26 LAB — URINE MICROSCOPIC-ADD ON

## 2011-05-26 LAB — CBC
HCT: 37.3 % (ref 36.0–46.0)
MCHC: 34.3 g/dL (ref 30.0–36.0)
MCV: 94.7 fL (ref 78.0–100.0)
RDW: 14.8 % (ref 11.5–15.5)

## 2011-05-26 LAB — URINALYSIS, ROUTINE W REFLEX MICROSCOPIC
Glucose, UA: NEGATIVE mg/dL
Ketones, ur: NEGATIVE mg/dL
Nitrite: NEGATIVE
Protein, ur: NEGATIVE mg/dL

## 2011-05-26 LAB — LACTIC ACID, PLASMA: Lactic Acid, Venous: 1.5 mmol/L (ref 0.5–2.2)

## 2011-05-26 LAB — POCT I-STAT, CHEM 8
Calcium, Ion: 1.15 mmol/L (ref 1.12–1.32)
HCT: 39 % (ref 36.0–46.0)
Hemoglobin: 13.3 g/dL (ref 12.0–15.0)
TCO2: 24 mmol/L (ref 0–100)

## 2011-05-26 LAB — COMPREHENSIVE METABOLIC PANEL
Albumin: 2.8 g/dL — ABNORMAL LOW (ref 3.5–5.2)
BUN: 25 mg/dL — ABNORMAL HIGH (ref 6–23)
Creatinine, Ser: 0.87 mg/dL (ref 0.50–1.10)
Potassium: 4 mEq/L (ref 3.5–5.1)
Total Protein: 5.9 g/dL — ABNORMAL LOW (ref 6.0–8.3)

## 2011-05-26 LAB — PRO B NATRIURETIC PEPTIDE: Pro B Natriuretic peptide (BNP): 1788 pg/mL — ABNORMAL HIGH (ref 0–450)

## 2011-05-26 LAB — PROTIME-INR
INR: 2.49 — ABNORMAL HIGH (ref 0.00–1.49)
Prothrombin Time: 27.3 seconds — ABNORMAL HIGH (ref 11.6–15.2)

## 2011-05-26 MED ORDER — ACETAMINOPHEN 650 MG RE SUPP
650.0000 mg | Freq: Once | RECTAL | Status: AC
  Administered 2011-05-26: 650 mg via RECTAL

## 2011-05-26 MED ORDER — FOLIC ACID 1 MG PO TABS
1.0000 mg | ORAL_TABLET | Freq: Every day | ORAL | Status: DC
  Administered 2011-05-26 – 2011-05-29 (×4): 1 mg via ORAL
  Filled 2011-05-26 (×4): qty 1

## 2011-05-26 MED ORDER — GUAIFENESIN-DM 100-10 MG/5ML PO SYRP
5.0000 mL | ORAL_SOLUTION | ORAL | Status: DC | PRN
  Filled 2011-05-26: qty 5

## 2011-05-26 MED ORDER — HYDROCORTISONE SOD SUCCINATE 100 MG IJ SOLR
50.0000 mg | Freq: Four times a day (QID) | INTRAMUSCULAR | Status: DC
  Administered 2011-05-26 – 2011-05-27 (×5): 50 mg via INTRAVENOUS
  Filled 2011-05-26 (×10): qty 2

## 2011-05-26 MED ORDER — SENNA 8.6 MG PO TABS
1.0000 | ORAL_TABLET | Freq: Two times a day (BID) | ORAL | Status: DC
  Administered 2011-05-26 – 2011-05-29 (×5): 8.6 mg via ORAL
  Filled 2011-05-26 (×8): qty 1

## 2011-05-26 MED ORDER — DEXTROSE 5 % IV SOLN
500.0000 mg | INTRAVENOUS | Status: DC
  Administered 2011-05-26 – 2011-05-27 (×2): 500 mg via INTRAVENOUS
  Filled 2011-05-26 (×3): qty 500

## 2011-05-26 MED ORDER — ONDANSETRON HCL 4 MG PO TABS: 4.0000 mg | ORAL_TABLET | Freq: Four times a day (QID) | ORAL | Status: DC | PRN

## 2011-05-26 MED ORDER — ONDANSETRON HCL 4 MG/2ML IJ SOLN: 4.0000 mg | Freq: Four times a day (QID) | INTRAMUSCULAR | Status: DC | PRN

## 2011-05-26 MED ORDER — POTASSIUM CHLORIDE CRYS ER 20 MEQ PO TBCR
20.0000 meq | EXTENDED_RELEASE_TABLET | Freq: Two times a day (BID) | ORAL | Status: DC
Start: 2011-05-26 — End: 2011-05-29
  Administered 2011-05-26 – 2011-05-29 (×6): 20 meq via ORAL
  Filled 2011-05-26 (×9): qty 1

## 2011-05-26 MED ORDER — DEXTROSE 5 % IV SOLN
500.0000 mg | Freq: Once | INTRAVENOUS | Status: AC
  Administered 2011-05-26: 500 mg via INTRAVENOUS
  Filled 2011-05-26: qty 500

## 2011-05-26 MED ORDER — ACETAMINOPHEN 325 MG PO TABS: 650.0000 mg | ORAL_TABLET | Freq: Four times a day (QID) | ORAL | Status: DC | PRN

## 2011-05-26 MED ORDER — WARFARIN SODIUM 3 MG PO TABS
3.0000 mg | ORAL_TABLET | Freq: Once | ORAL | Status: AC
  Administered 2011-05-26: 3 mg via ORAL
  Filled 2011-05-26: qty 1

## 2011-05-26 MED ORDER — ZOLPIDEM TARTRATE 5 MG PO TABS: 5.0000 mg | ORAL_TABLET | Freq: Every evening | ORAL | Status: DC | PRN

## 2011-05-26 MED ORDER — HYDROCODONE-ACETAMINOPHEN 5-325 MG PO TABS
0.5000 | ORAL_TABLET | ORAL | Status: DC | PRN
  Administered 2011-05-28 – 2011-05-29 (×2): 1 via ORAL
  Filled 2011-05-26 (×2): qty 1

## 2011-05-26 MED ORDER — DEXTROSE 5 % IV SOLN
1.0000 g | Freq: Once | INTRAVENOUS | Status: AC
  Administered 2011-05-26: 1 g via INTRAVENOUS
  Filled 2011-05-26: qty 10

## 2011-05-26 MED ORDER — ALBUTEROL SULFATE (5 MG/ML) 0.5% IN NEBU
5.0000 mg | INHALATION_SOLUTION | Freq: Once | RESPIRATORY_TRACT | Status: AC
  Administered 2011-05-26: 5 mg via RESPIRATORY_TRACT

## 2011-05-26 MED ORDER — ALBUTEROL SULFATE (5 MG/ML) 0.5% IN NEBU: 2.5000 mg | INHALATION_SOLUTION | RESPIRATORY_TRACT | Status: DC | PRN

## 2011-05-26 MED ORDER — ACETAMINOPHEN 325 MG RE SUPP
RECTAL | Status: AC
  Administered 2011-05-26: 650 mg via RECTAL
  Filled 2011-05-26: qty 2

## 2011-05-26 MED ORDER — PANTOPRAZOLE SODIUM 40 MG PO TBEC
40.0000 mg | DELAYED_RELEASE_TABLET | Freq: Every day | ORAL | Status: DC
  Administered 2011-05-26 – 2011-05-28 (×3): 40 mg via ORAL
  Filled 2011-05-26 (×3): qty 1

## 2011-05-26 MED ORDER — MORPHINE SULFATE 2 MG/ML IJ SOLN
1.0000 mg | INTRAMUSCULAR | Status: DC | PRN
  Administered 2011-05-26: 1 mg via INTRAVENOUS
  Filled 2011-05-26: qty 1

## 2011-05-26 MED ORDER — ACETAMINOPHEN 650 MG RE SUPP: 650.0000 mg | Freq: Four times a day (QID) | RECTAL | Status: DC | PRN

## 2011-05-26 MED ORDER — POLYETHYLENE GLYCOL 3350 17 G PO PACK
17.0000 g | PACK | Freq: Two times a day (BID) | ORAL | Status: DC
  Administered 2011-05-26 (×2): 17 g via ORAL
  Filled 2011-05-26 (×8): qty 1

## 2011-05-26 MED ORDER — SODIUM CHLORIDE 0.9 % IV SOLN
INTRAVENOUS | Status: DC
  Administered 2011-05-26: 13:00:00 via INTRAVENOUS
  Administered 2011-05-27: 950 mL via INTRAVENOUS

## 2011-05-26 MED ORDER — FLEET ENEMA 7-19 GM/118ML RE ENEM
1.0000 | ENEMA | Freq: Once | RECTAL | Status: AC | PRN
  Filled 2011-05-26: qty 1

## 2011-05-26 MED ORDER — ALUM & MAG HYDROXIDE-SIMETH 200-200-20 MG/5ML PO SUSP: 30.0000 mL | Freq: Four times a day (QID) | ORAL | Status: DC | PRN

## 2011-05-26 MED ORDER — ALBUTEROL SULFATE (5 MG/ML) 0.5% IN NEBU
INHALATION_SOLUTION | RESPIRATORY_TRACT | Status: AC
  Administered 2011-05-26: 5 mg via RESPIRATORY_TRACT
  Filled 2011-05-26: qty 1

## 2011-05-26 MED ORDER — SIMVASTATIN 5 MG PO TABS
5.0000 mg | ORAL_TABLET | Freq: Every day | ORAL | Status: DC
  Administered 2011-05-26 – 2011-05-28 (×3): 5 mg via ORAL
  Filled 2011-05-26 (×4): qty 1

## 2011-05-26 MED ORDER — DEXTROSE 5 % IV SOLN
1.0000 g | INTRAVENOUS | Status: DC
  Administered 2011-05-26 – 2011-05-27 (×2): 1 g via INTRAVENOUS
  Filled 2011-05-26 (×3): qty 10

## 2011-05-26 MED ORDER — SODIUM CHLORIDE 0.9 % IV SOLN
Freq: Once | INTRAVENOUS | Status: AC
  Administered 2011-05-26: 1000 mL via INTRAVENOUS

## 2011-05-26 NOTE — ED Notes (Signed)
Respiratory at bedside.

## 2011-05-26 NOTE — Progress Notes (Signed)
ANTICOAGULATION CONSULT NOTE - Initial Consult  Pharmacy Consult for Coumadin Indication: atrial fibrillation  No Known Allergies  Patient Measurements: Height: 5\' 7"  (170.2 cm) Weight: 102 lb (46.267 kg) IBW/kg (Calculated) : 61.6   Vital Signs: Temp: 97.9 F (36.6 C) (01/02 0948) Temp src: Oral (01/02 0948) BP: 86/49 mmHg (01/02 0948) Pulse Rate: 85  (01/02 0948)  Labs:  Basename 05/26/11 0544 05/26/11 0519  HGB 13.3 12.8  HCT 39.0 37.3  PLT -- 270  APTT -- --  LABPROT -- 27.3*  INR -- 2.49*  HEPARINUNFRC -- --  CREATININE 0.90 0.87  CKTOTAL -- --  CKMB -- --  TROPONINI -- --   Estimated Creatinine Clearance: 32.2 ml/min (by C-G formula based on Cr of 0.9).  Medical History: Past Medical History  Diagnosis Date  . Hypokalemia   . GERD (gastroesophageal reflux disease)   . Temporal arteritis   . Campath-induced atrial fibrillation   . Hypothyroidism   . Hyponatremia     Medications:  Prescriptions prior to admission  Medication Sig Dispense Refill  . bisoprolol (ZEBETA) 10 MG tablet Take 10 mg by mouth 2 (two) times daily.        . folic acid (FOLVITE) 1 MG tablet Take 1 mg by mouth daily.        Marland Kitchen HYDROcodone-acetaminophen (NORCO) 5-325 MG per tablet Take 0.5-1 tablets by mouth every 4 (four) hours as needed. pain      . methotrexate (RHEUMATREX) 2.5 MG tablet Take 12.5 mg by mouth once a week. Caution:Chemotherapy. Protect from light.   Takes 5 tablets every Tuesday. For Temporal Arthritis      . omeprazole (PRILOSEC) 20 MG capsule Take 20 mg by mouth daily as needed. indigestion      . potassium chloride SA (K-DUR,KLOR-CON) 20 MEQ tablet Take 20 mEq by mouth 2 (two) times daily.       . predniSONE (DELTASONE) 5 MG tablet Take 5 mg by mouth at bedtime.        . simvastatin (ZOCOR) 5 MG tablet Take 5 mg by mouth at bedtime.        Marland Kitchen warfarin (COUMADIN) 2 MG tablet Take 2-3 mg by mouth daily. Takes 1.5 tabs daily except on Tuesday, then takes 1 tab        Scheduled:    . sodium chloride   Intravenous Once  . acetaminophen  650 mg Rectal Once  . albuterol  5 mg Nebulization Once  . azithromycin (ZITHROMAX) 500 MG IVPB  500 mg Intravenous Once  . azithromycin  500 mg Intravenous Q24H  . cefTRIAXone (ROCEPHIN)  IV  1 g Intravenous Once  . cefTRIAXone (ROCEPHIN)  IV  1 g Intravenous Q24H  . folic acid  1 mg Oral Daily  . hydrocortisone sod succinate (SOLU-CORTEF) injection  50 mg Intravenous Q6H  . pantoprazole  40 mg Oral Q1200  . polyethylene glycol  17 g Oral BID  . potassium chloride SA  20 mEq Oral BID  . senna  1 tablet Oral BID  . simvastatin  5 mg Oral QHS   Infusions:    . sodium chloride     Patient Active Problem List  Diagnoses  . HYPOTHYROIDISM  . HYPONATREMIA  . HYPOKALEMIA  . Campath-induced atrial fibrillation  . CHF  . TEMPORAL ARTERITIS  . GERD  . PERS HX SURG HRT&GREAT VES PRS HAZARDS HEALTH  . Sepsis associated hypotension  . PNA (pneumonia)  . Respiratory failure with hypoxia    Admit  Complaint: SOB, Pneumonia (aspiration vs. CAP) Pharmacy System-Based Medication Review: Anticoagulation: Atrial Fibrillation - INR is currently therapeutic on her home Coumadin regimen, however, Coumadin requirements can change during acute illness and due to interactions with antibiotics. Infectious Diseases:  Pneumonia, aspiration vs. CAP: On Day #1 of empiric therapy with Ceftriaxone and Azithromycin. Neurology: Temporal arteritis - takes Methotrexate on Tuesdays + Prednisone 5mg  qday. Pulmonary: PNA - requiring O2 supplementation Cardiovascular: Hypotensive currently, on hold Gastrointestinal: hx GERD: on PPI Endocrinology: Stress-dose steroids for sepsis, on Prednisone 5mg  QHS PTA Fluids/Electrolytes/Nutrition: hx hyponatremia - Na 133. Other electrolytes OK Nephrology: SCr WNL Hematology/Oncology: CBC ok, no hematologic adverse effects from methotrexate detected. Takes folic acid. Best Practices: INR is  therapeutic PTA Medication Problems:  Restart methotrexate and bisoprolol when appropriate.  Goal of Therapy:  INR 2-3   Plan:  Coumadin 3mg  today (her usual Wednesday dose) Daily PT/INR monitoring.  Estella Husk, Pharm.D., BCPS Clinical Pharmacist  Pager [phone] 05/26/2011, 10:29 AM
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
## 2011-05-26 NOTE — ED Notes (Signed)
Patient from home; brought in by EMS.  Patient lives at home with home health aid; aid states that patient became nauseous tonight and had several episodes of vomiting.  After vomiting, patient became short of breath and had difficulty breathing; aid called EMS.  Upon EMS arrival, patient placed on CPAP and given 25 mg of Lasix.  IV started in left forearm (20 GA).  Patient responds to verbal stimuli, but provides little information.  Blankets and gown covered in emesis upon patient arrival to hospital.  Upon arrival to room, patient placed on BiPAP by respiratory.  Patient currently 96% on BiPAP.  Heart rate ranges from 98/130; paced rhythm.  Patient complaining of pain in her lower back and legs.  Dr. Dierdre Highman at bedside.  Will continue to monitor.

## 2011-05-26 NOTE — ED Notes (Signed)
Pt continues to have low blood pressure. Critical care MD to bedside and speaks with patient about aggressive life support such as CPR or intubation. Pt states she does not want that. Pt seems alert and oriented and "matter of fact" about subject matter. R/T has fully set up for intubation if needed, on stand by for now.
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
## 2011-05-26 NOTE — ED Notes (Signed)
Dr. Dierdre Highman at bedside assessing patient.

## 2011-05-26 NOTE — ED Notes (Signed)
Lab at bedside to collect blood specimens.
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
## 2011-05-27 LAB — PROTIME-INR
INR: 2.83 — ABNORMAL HIGH (ref 0.00–1.49)
Prothrombin Time: 30.2 seconds — ABNORMAL HIGH (ref 11.6–15.2)

## 2011-05-27 LAB — CBC
HCT: 29.9 % — ABNORMAL LOW (ref 36.0–46.0)
MCHC: 33.8 g/dL (ref 30.0–36.0)
Platelets: 196 10*3/uL (ref 150–400)
RDW: 14.6 % (ref 11.5–15.5)
WBC: 11.1 10*3/uL — ABNORMAL HIGH (ref 4.0–10.5)

## 2011-05-27 LAB — BASIC METABOLIC PANEL
BUN: 22 mg/dL (ref 6–23)
Chloride: 100 mEq/L (ref 96–112)
GFR calc Af Amer: 89 mL/min — ABNORMAL LOW (ref >90)
GFR calc non Af Amer: 77 mL/min — ABNORMAL LOW (ref >90)
Potassium: 5 mEq/L (ref 3.5–5.1)
Sodium: 133 mEq/L — ABNORMAL LOW (ref 135–145)

## 2011-05-27 MED ORDER — WARFARIN SODIUM 2 MG PO TABS
2.0000 mg | ORAL_TABLET | Freq: Once | ORAL | Status: AC
  Administered 2011-05-27: 2 mg via ORAL
  Filled 2011-05-27: qty 1

## 2011-05-27 MED ORDER — HYDROCORTISONE SOD SUCCINATE 100 MG IJ SOLR
25.0000 mg | Freq: Four times a day (QID) | INTRAMUSCULAR | Status: DC
  Administered 2011-05-27 – 2011-05-28 (×3): 25 mg via INTRAVENOUS
  Filled 2011-05-27 (×7): qty 0.5

## 2011-05-27 MED FILL — Furosemide Inj 10 MG/ML: INTRAMUSCULAR | Qty: 2 | Status: AC

## 2011-05-27 NOTE — Progress Notes (Signed)
ANTICOAGULATION CONSULT NOTE - Initial Consult  Pharmacy Consult for Coumadin Indication: atrial fibrillation  No Known Allergies  Patient Measurements: Height: 5\' 7"  (170.2 cm) Weight: 102 lb (46.267 kg) IBW/kg (Calculated) : 61.6   Vital Signs: Temp: 97 F (36.1 C) (01/03 0519) Temp src: Axillary (01/03 0519) BP: 103/47 mmHg (01/03 0519) Pulse Rate: 70  (01/03 0519)  Labs:  Basename 05/27/11 0625 05/26/11 0544 05/26/11 0519  HGB 10.1* 13.3 --  HCT 29.9* 39.0 37.3  PLT 196 -- 270  APTT -- -- --  LABPROT 30.2* -- 27.3*  INR 2.83* -- 2.49*  HEPARINUNFRC -- -- --  CREATININE 0.66 0.90 0.87  CKTOTAL -- -- --  CKMB -- -- --  TROPONINI -- -- --   Estimated Creatinine Clearance: 36.2 ml/min (by C-G formula based on Cr of 0.66).  Medical History: Past Medical History  Diagnosis Date  . Hypokalemia   . GERD (gastroesophageal reflux disease)   . Temporal arteritis   . Campath-induced atrial fibrillation   . Hypothyroidism   . Hyponatremia   . Angina   . Shortness of breath     Medications:  Prescriptions prior to admission  Medication Sig Dispense Refill  . bisoprolol (ZEBETA) 10 MG tablet Take 10 mg by mouth 2 (two) times daily.        . folic acid (FOLVITE) 1 MG tablet Take 1 mg by mouth daily.        Marland Kitchen HYDROcodone-acetaminophen (NORCO) 5-325 MG per tablet Take 0.5-1 tablets by mouth every 4 (four) hours as needed. pain      . methotrexate (RHEUMATREX) 2.5 MG tablet Take 12.5 mg by mouth once a week. Caution:Chemotherapy. Protect from light.   Takes 5 tablets every Tuesday. For Temporal Arthritis      . omeprazole (PRILOSEC) 20 MG capsule Take 20 mg by mouth daily as needed. indigestion      . potassium chloride SA (K-DUR,KLOR-CON) 20 MEQ tablet Take 20 mEq by mouth 2 (two) times daily.       . predniSONE (DELTASONE) 5 MG tablet Take 5 mg by mouth at bedtime.        . simvastatin (ZOCOR) 5 MG tablet Take 5 mg by mouth at bedtime.        Marland Kitchen warfarin (COUMADIN) 2  MG tablet Take 2-3 mg by mouth daily. Takes 1.5 tabs daily except on Tuesday, then takes 1 tab       Scheduled:     . azithromycin  500 mg Intravenous Q24H  . cefTRIAXone (ROCEPHIN)  IV  1 g Intravenous Q24H  . folic acid  1 mg Oral Daily  . hydrocortisone sod succinate (SOLU-CORTEF) injection  50 mg Intravenous Q6H  . pantoprazole  40 mg Oral Q1200  . polyethylene glycol  17 g Oral BID  . potassium chloride SA  20 mEq Oral BID  . senna  1 tablet Oral BID  . simvastatin  5 mg Oral QHS  . warfarin  3 mg Oral ONCE-1800   Infusions:     . sodium chloride 75 mL/hr at 05/27/11 1500   Patient Active Problem List  Diagnoses  . HYPOTHYROIDISM  . HYPONATREMIA  . HYPOKALEMIA  . Campath-induced atrial fibrillation  . CHF  . TEMPORAL ARTERITIS  . GERD  . PERS HX SURG HRT&GREAT VES PRS HAZARDS HEALTH  . Sepsis associated hypotension  . PNA (pneumonia)  . Respiratory failure with hypoxia    Assessment: 78 y.o female with h/o A. Fib on chronic coumadin prior  to admission.  Admitted due to PNA. Today INR = 2.83 . INR remains therapeutic. Hgb decreased to 10.1,  Hct 29.9, pltc 196K.  No bleeding noted. Currently on Azithromycin which may increase Coumadin effect. INR therapeutic but has increased from 2.4.  Right subacute femoral neck fracture and retained compression plate/hipscrew. Family discussing surgery option.  Will reduce coumadin from her usual home dose due to Azithromycin.  Goal of Therapy:  INR 2-3   Plan:  Coumadin 2mg g today .  Daily PT/INR monitoring.  Arman Filter Clinical Pharmacist 05/27/2011, 4:41 PM

## 2011-05-27 NOTE — Progress Notes (Signed)
PATIENT DETAILS Name: Janice [name] Age: 78 y.o. Sex: female Date of Birth: 1922-04-22 Admit Date: 05/26/2011 ZOX:[id number],WJXBJ, MD, MD  Subjective: Significantly better, afebrile overnight. Looks very comfortable.  Objective: Vital signs in last 24 hours: Filed Vitals:   05/26/11 0920 05/26/11 0948 05/26/11 1030 05/27/11 0519  BP:  86/49 105/52 103/47  Pulse:  85 90 70  Temp: 97.7 F (36.5 C) 97.9 F (36.6 C) 97.6 F (36.4 C) 97 F (36.1 C)  TempSrc: Oral Oral Oral Axillary  Resp:  18 18 20   Height:  5\' 7"  (1.702 m)    Weight:  46.267 kg (102 lb)    SpO2:  92% 94% 99%    Weight change:   Body mass index is 15.98 kg/(m^2).  Intake/Output from previous day:  Intake/Output Summary (Last 24 hours) at 05/27/11 1643 Last data filed at 05/27/11 1500  Gross per 24 hour  Intake   1846 ml  Output    650 ml  Net   1196 ml    PHYSICAL EXAM: Gen Exam: Awake and alert with clear speech.   Neck: Supple, No JVD.   Chest: B/L Clear.   CVS: S1 S2 Regular, no murmurs.  Abdomen: soft, BS +, non tender, non distended.  Extremities: no edema, lower extremities warm to touch. Neurologic: Non Focal.  Skin: No Rash.   Wounds: N/A.    CONSULTS:  orthopedic surgery  LAB RESULTS: CBC  Lab 05/27/11 0625 05/26/11 0544 05/26/11 0519  WBC 11.1* -- 10.1  HGB 10.1* 13.3 12.8  HCT 29.9* 39.0 37.3  PLT 196 -- 270  MCV 94.6 -- 94.7  MCH 32.0 -- 32.5  MCHC 33.8 -- 34.3  RDW 14.6 -- 14.8  LYMPHSABS -- -- --  MONOABS -- -- --  EOSABS -- -- --  BASOSABS -- -- --  BANDABS -- -- --    Chemistries   Lab 05/27/11 0625 05/26/11 0544 05/26/11 0519  NA 133* 133* 132*  K 5.0 4.0 4.0  CL 100 101 97  CO2 23 -- 23  GLUCOSE 138* 159* 156*  BUN 22 24* 25*  CREATININE 0.66 0.90 0.87  CALCIUM 9.0 -- 9.1  MG -- -- --    GFR Estimated Creatinine Clearance: 36.2 ml/min (by C-G formula based on Cr of 0.66).  Coagulation profile  Lab 05/27/11 0625 05/26/11 0519  INR 2.83* 2.49*    PROTIME -- --    Cardiac Enzymes No results found for this basename: CK:3,CKMB:3,TROPONINI:3,MYOGLOBIN:3 in the last 168 hours  No components found with this basename: POCBNP:3 No results found for this basename: DDIMER:2 in the last 72 hours No results found for this basename: HGBA1C:2 in the last 72 hours No results found for this basename: CHOL:2,HDL:2,LDLCALC:2,TRIG:2,CHOLHDL:2,LDLDIRECT:2 in the last 72 hours No results found for this basename: TSH,T4TOTAL,FREET3,T3FREE,THYROIDAB in the last 72 hours No results found for this basename: VITAMINB12:2,FOLATE:2,FERRITIN:2,TIBC:2,IRON:2,RETICCTPCT:2 in the last 72 hours No results found for this basename: LIPASE:2,AMYLASE:2 in the last 72 hours  Urine Studies No results found for this basename: UACOL:2,UAPR:2,USPG:2,UPH:2,UTP:2,UGL:2,UKET:2,UBIL:2,UHGB:2,UNIT:2,UROB:2,ULEU:2,UEPI:2,UWBC:2,URBC:2,UBAC:2,CAST:2,CRYS:2,UCOM:2,BILUA:2 in the last 72 hours  MICROBIOLOGY: No results found for this or any previous visit (from the past 240 hour(s)).  RADIOLOGY STUDIES/RESULTS: Dg Chest Portable 1 View  05/26/2011  *RADIOLOGY REPORT*  Clinical Data: Shortness of breath  PORTABLE CHEST - 1 VIEW  Comparison: 12/16/2010  Findings: Interval development of airspace consolidation with air bronchograms in the right lung base consistent with acute pneumonia.  Normal heart size and pulmonary vascularity.  Stable appearance of postoperative changes  in the mediastinum and cardiac pacemaker.  Bilateral apical pleural thickening.  Emphysematous changes and scattered fibrosis in the lungs.  IMPRESSION: Interval development of airspace consolidation in the right lower lung consistent with pneumonia.  Original Report Authenticated By: Marlon Pel, M.D.    MEDICATIONS: Scheduled Meds:   . azithromycin  500 mg Intravenous Q24H  . cefTRIAXone (ROCEPHIN)  IV  1 g Intravenous Q24H  . folic acid  1 mg Oral Daily  . hydrocortisone sod succinate (SOLU-CORTEF)  injection  50 mg Intravenous Q6H  . pantoprazole  40 mg Oral Q1200  . polyethylene glycol  17 g Oral BID  . potassium chloride SA  20 mEq Oral BID  . senna  1 tablet Oral BID  . simvastatin  5 mg Oral QHS  . warfarin  3 mg Oral ONCE-1800   Continuous Infusions:   . sodium chloride 75 mL/hr at 05/27/11 1500   PRN Meds:.acetaminophen, acetaminophen, albuterol, alum & mag hydroxide-simeth, guaiFENesin-dextromethorphan, HYDROcodone-acetaminophen, morphine, ondansetron (ZOFRAN) IV, ondansetron, sodium phosphate, zolpidem  Antibiotics: Anti-infectives     Start     Dose/Rate Route Frequency Ordered Stop   05/26/11 1200   azithromycin (ZITHROMAX) 500 mg in dextrose 5 % 250 mL IVPB        500 mg 250 mL/hr over 60 Minutes Intravenous Every 24 hours 05/26/11 0954     05/26/11 1100   cefTRIAXone (ROCEPHIN) 1 g in dextrose 5 % 50 mL IVPB        1 g 100 mL/hr over 30 Minutes Intravenous Every 24 hours 05/26/11 0954     05/26/11 0615   cefTRIAXone (ROCEPHIN) 1 g in dextrose 5 % 50 mL IVPB        1 g 100 mL/hr over 30 Minutes Intravenous  Once 05/26/11 0606 05/26/11 0755   05/26/11 0615   azithromycin (ZITHROMAX) 500 mg in dextrose 5 % 250 mL IVPB        500 mg 250 mL/hr over 60 Minutes Intravenous  Once 05/26/11 0606 05/26/11 0721          Assessment/Plan: Patient Active Hospital Problem List: PNA (pneumonia)    Assessment: This is community-acquired. Patient is significantly better. Afebrile overnight. No further episodes of hypotension.    Plan: Continue with Rocephin and Zithromax.   Sepsis associated hypotension    Assessment: This is secondary to pneumonia. Her blood pressure is significantly better.    Plan: Gently continue with IV fluids for now. Hopefully we will slowly start to decrease as soon as well.   Respiratory failure with hypoxia    Assessment: Stable.    Plan: Continue with oxygen via nasal cannula.   atrial fibrillation   Assessment: Chronic issue. Off rate  control agents given initial presentation of hypotension. On chronic Coumadin therapy.    Plan: Resume Bisoprolol when blood pressure better. Long discussion with patient regarding risk benefits of continuing Coumadin. She wanted me to talk with Dr. Shary Decamp, her primary care practitioner, I did speak with Dr. Ronne Binning over the phone, current consensus is that the patient is very reluctant to come off Coumadin, patient will followup with Dr. Ronne Binning as an outpatient and decide whether or not to continue Coumadin for the long haul.   Right hip fracture  Assessment: Perhaps secondary to a recent fall.  Plan: Patient at this point refuses to undergo surgical correction. We'll get physical therapy evaluation.  CHF    Assessment: This is diastolic dysfunction. Currently she is compensated.  Plan: Monitor for signs of volume overload while on IV fluids.   TEMPORAL ARTERITIS    Assessment: This is a chronic issue. She is prednisone dependent.    Plan: Currently on hydrocortisone, will slowly taper back to prednisone.   GERD    Assessment: Stable    Plan: Continue with PPI.   Disposition: Remain inpatient. Wants to go home as has 24/7 care.  DVT Prophylaxis: Not needed as on Coumadin.  Code Status: DO NOT RESUSCITATE.  Maretta Bees,  MD. 05/27/2011, 4:43 PM

## 2011-05-27 NOTE — Progress Notes (Signed)
Utilization review completed. Janice [name] 05/27/2011 

## 2011-05-28 LAB — PROTIME-INR
INR: 3.21 — ABNORMAL HIGH (ref 0.00–1.49)
Prothrombin Time: 33.3 seconds — ABNORMAL HIGH (ref 11.6–15.2)

## 2011-05-28 MED ORDER — AZITHROMYCIN 500 MG PO TABS
500.0000 mg | ORAL_TABLET | Freq: Every day | ORAL | Status: DC
  Administered 2011-05-28 – 2011-05-29 (×2): 500 mg via ORAL
  Filled 2011-05-28 (×2): qty 1

## 2011-05-28 MED ORDER — CEFUROXIME AXETIL 500 MG PO TABS
500.0000 mg | ORAL_TABLET | Freq: Two times a day (BID) | ORAL | Status: DC
  Administered 2011-05-28 – 2011-05-29 (×2): 500 mg via ORAL
  Filled 2011-05-28 (×4): qty 1

## 2011-05-28 MED ORDER — PREDNISONE 10 MG PO TABS
10.0000 mg | ORAL_TABLET | Freq: Every day | ORAL | Status: DC
  Administered 2011-05-28 – 2011-05-29 (×2): 10 mg via ORAL
  Filled 2011-05-28 (×3): qty 1
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
## 2011-05-28 NOTE — Progress Notes (Signed)
PATIENT DETAILS Name: Janice [name] Age: 78 y.o. Sex: female Date of Birth: Dec 13, 1921 Admit Date: 05/26/2011 ZOX:[id number],WJXBJ, MD, MD  Subjective: Continues to improve.  Objective: Vital signs in last 24 hours: Filed Vitals:   05/27/11 0519 05/28/11 0540 05/28/11 1000 05/28/11 1005  BP: 103/47 123/66    Pulse: 70 69    Temp: 97 F (36.1 C) 97.5 F (36.4 C)    TempSrc: Axillary Oral    Resp: 20 20    Height:      Weight:      SpO2: 99% 98% 93% 96%    Weight change:   Body mass index is 15.98 kg/(m^2).  Intake/Output from previous day:  Intake/Output Summary (Last 24 hours) at 05/28/11 1755 Last data filed at 05/28/11 1435  Gross per 24 hour  Intake    480 ml  Output    850 ml  Net   -370 ml    PHYSICAL EXAM: Gen Exam: Awake and alert with clear speech.   Neck: Supple, No JVD.   Chest: B/L Clear.   CVS: S1 S2 Regular, no murmurs.  Abdomen: soft, BS +, non tender, non distended.  Extremities: no edema, lower extremities warm to touch. Neurologic: Non Focal.  Skin: No Rash.   Wounds: N/A.    CONSULTS:  orthopedic surgery  LAB RESULTS: CBC  Lab 05/27/11 0625 05/26/11 0544 05/26/11 0519  WBC 11.1* -- 10.1  HGB 10.1* 13.3 12.8  HCT 29.9* 39.0 37.3  PLT 196 -- 270  MCV 94.6 -- 94.7  MCH 32.0 -- 32.5  MCHC 33.8 -- 34.3  RDW 14.6 -- 14.8  LYMPHSABS -- -- --  MONOABS -- -- --  EOSABS -- -- --  BASOSABS -- -- --  BANDABS -- -- --    Chemistries   Lab 05/27/11 0625 05/26/11 0544 05/26/11 0519  NA 133* 133* 132*  K 5.0 4.0 4.0  CL 100 101 97  CO2 23 -- 23  GLUCOSE 138* 159* 156*  BUN 22 24* 25*  CREATININE 0.66 0.90 0.87  CALCIUM 9.0 -- 9.1  MG -- -- --    GFR Estimated Creatinine Clearance: 36.2 ml/min (by C-G formula based on Cr of 0.66).  Coagulation profile  Lab 05/28/11 0601 05/27/11 0625 05/26/11 0519  INR 3.21* 2.83* 2.49*  PROTIME -- -- --    Cardiac Enzymes No results found for this basename:  CK:3,CKMB:3,TROPONINI:3,MYOGLOBIN:3 in the last 168 hours  No components found with this basename: POCBNP:3 No results found for this basename: DDIMER:2 in the last 72 hours No results found for this basename: HGBA1C:2 in the last 72 hours No results found for this basename: CHOL:2,HDL:2,LDLCALC:2,TRIG:2,CHOLHDL:2,LDLDIRECT:2 in the last 72 hours No results found for this basename: TSH,T4TOTAL,FREET3,T3FREE,THYROIDAB in the last 72 hours No results found for this basename: VITAMINB12:2,FOLATE:2,FERRITIN:2,TIBC:2,IRON:2,RETICCTPCT:2 in the last 72 hours No results found for this basename: LIPASE:2,AMYLASE:2 in the last 72 hours  Urine Studies No results found for this basename: UACOL:2,UAPR:2,USPG:2,UPH:2,UTP:2,UGL:2,UKET:2,UBIL:2,UHGB:2,UNIT:2,UROB:2,ULEU:2,UEPI:2,UWBC:2,URBC:2,UBAC:2,CAST:2,CRYS:2,UCOM:2,BILUA:2 in the last 72 hours  MICROBIOLOGY: No results found for this or any previous visit (from the past 240 hour(s)).  RADIOLOGY STUDIES/RESULTS: Dg Chest Portable 1 View  05/26/2011  *RADIOLOGY REPORT*  Clinical Data: Shortness of breath  PORTABLE CHEST - 1 VIEW  Comparison: 12/16/2010  Findings: Interval development of airspace consolidation with air bronchograms in the right lung base consistent with acute pneumonia.  Normal heart size and pulmonary vascularity.  Stable appearance of postoperative changes in the mediastinum and cardiac pacemaker.  Bilateral apical pleural thickening.  Emphysematous changes and scattered fibrosis in the lungs.  IMPRESSION: Interval development of airspace consolidation in the right lower lung consistent with pneumonia.  Original Report Authenticated By: Marlon Pel, M.D.    MEDICATIONS: Scheduled Meds:    . azithromycin  500 mg Oral Daily  . cefUROXime  500 mg Oral BID WC  . folic acid  1 mg Oral Daily  . pantoprazole  40 mg Oral Q1200  . polyethylene glycol  17 g Oral BID  . potassium chloride SA  20 mEq Oral BID  . predniSONE  10 mg Oral  Q breakfast  . senna  1 tablet Oral BID  . simvastatin  5 mg Oral QHS  . DISCONTD: azithromycin  500 mg Intravenous Q24H  . DISCONTD: cefTRIAXone (ROCEPHIN)  IV  1 g Intravenous Q24H  . DISCONTD: hydrocortisone sod succinate (SOLU-CORTEF) injection  25 mg Intravenous Q6H   Continuous Infusions:    . DISCONTD: sodium chloride 950 mL (05/27/11 1752)   PRN Meds:.acetaminophen, acetaminophen, albuterol, alum & mag hydroxide-simeth, guaiFENesin-dextromethorphan, HYDROcodone-acetaminophen, morphine, ondansetron (ZOFRAN) IV, ondansetron, zolpidem  Antibiotics: Anti-infectives     Start     Dose/Rate Route Frequency Ordered Stop   05/28/11 1700   cefUROXime (CEFTIN) tablet 500 mg     Comments: Please have pharmacy adjust dose      500 mg Oral 2 times daily with meals 05/28/11 1149     05/28/11 1300   azithromycin (ZITHROMAX) tablet 500 mg        500 mg Oral Daily 05/28/11 1149     05/26/11 1200   azithromycin (ZITHROMAX) 500 mg in dextrose 5 % 250 mL IVPB  Status:  Discontinued        500 mg 250 mL/hr over 60 Minutes Intravenous Every 24 hours 05/26/11 0954 05/28/11 1149   05/26/11 1100   cefTRIAXone (ROCEPHIN) 1 g in dextrose 5 % 50 mL IVPB  Status:  Discontinued        1 g 100 mL/hr over 30 Minutes Intravenous Every 24 hours 05/26/11 0954 05/28/11 1149   05/26/11 0615   cefTRIAXone (ROCEPHIN) 1 g in dextrose 5 % 50 mL IVPB        1 g 100 mL/hr over 30 Minutes Intravenous  Once 05/26/11 0606 05/26/11 0755   05/26/11 0615   azithromycin (ZITHROMAX) 500 mg in dextrose 5 % 250 mL IVPB        500 mg 250 mL/hr over 60 Minutes Intravenous  Once 05/26/11 0606 05/26/11 0721          Assessment/Plan: Patient Active Hospital Problem List: PNA (pneumonia)    Assessment: This is community-acquired. Patient is significantly better. Afebrile overnight. No further episodes of hypotension.    Plan: Has lost IV access, will transition to Ceftin and oral Zithromax.  Sepsis associated  hypotension    Assessment: This is secondary to pneumonia. Her blood pressure is significantly better.    Plan: stop all IV fluids.Marland Kitchen   Respiratory failure with hypoxia    Assessment: Stable.    Plan: Continue with oxygen via nasal cannula.   atrial fibrillation   Assessment: Chronic issue. Off rate control agents given initial presentation of hypotension. On chronic Coumadin therapy.    Plan: Resume Bisoprolol when blood pressure better. Long discussion with patient regarding risk benefits of continuing Coumadin. She wanted me to talk with Dr. Shary Decamp, her primary care practitioner, I did speak with Dr. Ronne Binning over the phone, current consensus is that the patient is very  reluctant to come off Coumadin, patient will followup with Dr. Ronne Binning as an outpatient and decide whether or not to continue Coumadin for the long haul.   Right hip fracture  Assessment: Perhaps secondary to a recent fall.  Plan: Patient at this point refuses to undergo surgical correction. We'll get physical therapy evaluation.  CHF    Assessment: This is diastolic dysfunction. Currently she is compensated.    Plan: Monitor for signs of volume overload while on IV fluids.   TEMPORAL ARTERITIS    Assessment: This is a chronic issue. She is prednisone dependent.    Plan: Discontinue hydrocortisone, change back to prednisone.   GERD    Assessment: Stable    Plan: Continue with PPI.   Disposition: Remain inpatient. Wants to go home as has 24/7 care- possible discharge tomorrow  DVT Prophylaxis: Not needed as on Coumadin.  Code Status: DO NOT RESUSCITATE.  Maretta Bees,  MD. 05/28/2011, 5:55 PM

## 2011-05-28 NOTE — Progress Notes (Signed)
ANTICOAGULATION CONSULT NOTE - Follow Up Consult  Pharmacy Consult for Coumadin Indication: atrial fibrillation  No Known Allergies  Patient Measurements: Height: 5\' 7"  (170.2 cm) Weight: 102 lb (46.267 kg) IBW/kg (Calculated) : 61.6   Vital Signs: Temp: 97.5 F (36.4 C) (01/04 0540) Temp src: Oral (01/04 0540) BP: 123/66 mmHg (01/04 0540) Pulse Rate: 69  (01/04 0540)  Labs:  Basename 05/28/11 0601 05/27/11 0625 05/26/11 0544 05/26/11 0519  HGB -- 10.1* 13.3 --  HCT -- 29.9* 39.0 37.3  PLT -- 196 -- 270  APTT -- -- -- --  LABPROT 33.3* 30.2* -- 27.3*  INR 3.21* 2.83* -- 2.49*  HEPARINUNFRC -- -- -- --  CREATININE -- 0.66 0.90 0.87  CKTOTAL -- -- -- --  CKMB -- -- -- --  TROPONINI -- -- -- --   Estimated Creatinine Clearance: 36.2 ml/min (by C-G formula based on Cr of 0.66).  Assessment:   INR is supratherapeutic today  No other labs today.   Day # 3 Azithromycin & Ceftriaxone for PNA.   Possible effect on INR from antibiotic therapy/acute illness.   Noted concern regarding long-term Coumadin therapy.   Significant fall some time last month.   Right subacute femoral neck fracture, retained compression plate/hip screw.  Discussion regarding hardware removal & hip replacement at some point, but patient currently refusing.  Goal of Therapy:  INR 2-3   Plan:    Will hold Coumadin today.     Continue daily PT/INR.   CBC in AM.  Dennie Fetters Pager: [phone] 05/28/2011,9:20 AM

## 2011-05-29 LAB — CBC
HCT: 29.7 % — ABNORMAL LOW (ref 36.0–46.0)
MCH: 31.6 pg (ref 26.0–34.0)
MCV: 94.9 fL (ref 78.0–100.0)
Platelets: 228 10*3/uL (ref 150–400)
RDW: 14.3 % (ref 11.5–15.5)
WBC: 6.4 10*3/uL (ref 4.0–10.5)

## 2011-05-29 MED ORDER — WARFARIN SODIUM 2.5 MG PO TABS
2.5000 mg | ORAL_TABLET | Freq: Once | ORAL | Status: DC
  Filled 2011-05-29: qty 1

## 2011-05-29 MED ORDER — AZITHROMYCIN 500 MG PO TABS: 500.0000 mg | ORAL_TABLET | Freq: Every day | ORAL | Status: AC

## 2011-05-29 MED ORDER — CEFUROXIME AXETIL 500 MG PO TABS: 500.0000 mg | ORAL_TABLET | Freq: Two times a day (BID) | ORAL | Status: AC

## 2011-05-29 MED ORDER — WARFARIN SODIUM 2 MG PO TABS
2.0000 mg | ORAL_TABLET | Freq: Once | ORAL | Status: DC
  Filled 2011-05-29: qty 1

## 2011-05-29 NOTE — Discharge Summary (Signed)
PATIENT DETAILS Name: Janice [name] Age: 78 y.o. Sex: female Date of Birth: 1921/12/13 MRN: [id number]. Admit Date: 05/26/2011 Admitting Physician: Shanker Ghimire WUJ:[id number],NFAOZ, MD, MD  PRIMARY DISCHARGE DIAGNOSIS:  Principal Problem:  *PNA (pneumonia) Active Problems:  Sepsis associated hypotension  Respiratory failure with hypoxia Right Hip Fracture  HYPOTHYROIDISM  Campath-induced atrial fibrillation  CHF  TEMPORAL ARTERITIS  GERD    PAST MEDICAL HISTORY: Past Medical History  Diagnosis Date  . Hypokalemia   . GERD (gastroesophageal reflux disease)   . Temporal arteritis   . Campath-induced atrial fibrillation   . Hypothyroidism   . Hyponatremia   . Angina   . Shortness of breath     DISCHARGE MEDICATIONS: Discharge Medication List as of 05/29/2011  1:54 PM    START taking these medications   Details  azithromycin (ZITHROMAX) 500 MG tablet Take 1 tablet (500 mg total) by mouth daily., Starting 05/29/2011, Until Thu 06/03/11, Print    cefUROXime (CEFTIN) 500 MG tablet Take 1 tablet (500 mg total) by mouth 2 (two) times daily with a meal., Starting 05/29/2011, Until Tue 06/08/11, Print      CONTINUE these medications which have NOT CHANGED   Details  bisoprolol (ZEBETA) 10 MG tablet Take 10 mg by mouth 2 (two) times daily.  , Until Discontinued, Historical Med    folic acid (FOLVITE) 1 MG tablet Take 1 mg by mouth daily.  , Until Discontinued, Historical Med    HYDROcodone-acetaminophen (NORCO) 5-325 MG per tablet Take 0.5-1 tablets by mouth every 4 (four) hours as needed. pain, Until Discontinued, Historical Med    methotrexate (RHEUMATREX) 2.5 MG tablet Take 12.5 mg by mouth once a week. Caution:Chemotherapy. Protect from light.   Takes 5 tablets every Tuesday. For Temporal Arthritis, Until Discontinued, Historical Med    omeprazole (PRILOSEC) 20 MG capsule Take 20 mg by mouth daily as needed. indigestion, Until Discontinued, Historical Med    potassium  chloride SA (K-DUR,KLOR-CON) 20 MEQ tablet Take 20 mEq by mouth 2 (two) times daily. , Until Discontinued, Historical Med    predniSONE (DELTASONE) 5 MG tablet Take 5 mg by mouth at bedtime.  , Until Discontinued, Historical Med    simvastatin (ZOCOR) 5 MG tablet Take 5 mg by mouth at bedtime.  , Until Discontinued, Historical Med    warfarin (COUMADIN) 2 MG tablet Take 2-3 mg by mouth daily. Takes 1.5 tabs daily except on Tuesday, then takes 1 tab, Until Discontinued, Historical Med         BRIEF HPI:  See H&P, Labs, Consult and Test reports for all details in brief, patient was admitted for PNA along with hypotension and hypoxia.For further details please see the history and physical done on admission.  CONSULTATIONS:   orthopedic surgery  PERTINENT RADIOLOGIC STUDIES: Dg Chest Portable 1 View  05/26/2011  *RADIOLOGY REPORT*  Clinical Data: Shortness of breath  PORTABLE CHEST - 1 VIEW  Comparison: 12/16/2010  Findings: Interval development of airspace consolidation with air bronchograms in the right lung base consistent with acute pneumonia.  Normal heart size and pulmonary vascularity.  Stable appearance of postoperative changes in the mediastinum and cardiac pacemaker.  Bilateral apical pleural thickening.  Emphysematous changes and scattered fibrosis in the lungs.  IMPRESSION: Interval development of airspace consolidation in the right lower lung consistent with pneumonia.  Original Report Authenticated By: Marlon Pel, M.D.     PERTINENT LAB RESULTS: CBC:  Basename 05/29/11 0645 05/27/11 0625  WBC 6.4 11.1*  HGB 9.9* 10.1*  HCT 29.7* 29.9*  PLT 228 196   CMET CMP     Component Value Date/Time   NA 133* 05/27/2011 0625   K 5.0 05/27/2011 0625   CL 100 05/27/2011 0625   CO2 23 05/27/2011 0625   GLUCOSE 138* 05/27/2011 0625   BUN 22 05/27/2011 0625   CREATININE 0.66 05/27/2011 0625   CALCIUM 9.0 05/27/2011 0625   PROT 5.9* 05/26/2011 0519   ALBUMIN 2.8* 05/26/2011 0519   AST 25  05/26/2011 0519   ALT 14 05/26/2011 0519   ALKPHOS 152* 05/26/2011 0519   BILITOT 0.6 05/26/2011 0519   GFRNONAA 77* 05/27/2011 0625   GFRAA 89* 05/27/2011 0625    GFR Estimated Creatinine Clearance: 36.2 ml/min (by C-G formula based on Cr of 0.66). No results found for this basename: LIPASE:2,AMYLASE:2 in the last 72 hours No results found for this basename: CKTOTAL:3,CKMB:3,CKMBINDEX:3,TROPONINI:3 in the last 72 hours No components found with this basename: POCBNP:3 No results found for this basename: DDIMER:2 in the last 72 hours No results found for this basename: HGBA1C:2 in the last 72 hours No results found for this basename: CHOL:2,HDL:2,LDLCALC:2,TRIG:2,CHOLHDL:2,LDLDIRECT:2 in the last 72 hours No results found for this basename: TSH,T4TOTAL,FREET3,T3FREE,THYROIDAB in the last 72 hours No results found for this basename: VITAMINB12:2,FOLATE:2,FERRITIN:2,TIBC:2,IRON:2,RETICCTPCT:2 in the last 72 hours Coags:  Basename 05/29/11 0645 05/28/11 0601  INR 2.18* 3.21*   Microbiology: No results found for this or any previous visit (from the past 240 hour(s)).   BRIEF HOSPITAL COURSE:   Principal Problem:  *PNA (pneumonia) Upon admission patient was profoundly sick with hypotension and hypoxia it she did require BiPAP support in the emergency room. However with administration of antibiotics and other supportive care patient quickly turned around him. This is more likely to be community acquired pneumonia. Patient was started on Rocephin and Zithromax. She has now been persistently afebrile. Chest x-ray showed a large right sided consolidation. She has made significant clinical improvement and is now nontoxic as well. She has been transitioned over to Ceftin and Zithromax, she will continue taking these medications as noted above. She will then followup with her primary care practitioner who will need to decide whether or not to followup with a chest x-ray to document resolution of the  infiltrate.  Active Problems:  Sepsis associated hypotension -She was found to be hypotensive on presentation, she was evaluated by critical care services. Upon further discussion patient did not want any further aggressive measures including pressors, central line placement and mechanical ventilation. As a result patient was admitted to the palliative care unit where she was provided with IV fluids and IV antibiotics. She made a remarkable recovery with antibiotics and fluids, hypotension resolved in 24 hours. On discharge she was resumed on her antihypertensive medications.   Respiratory failure with hypoxia -On admission patient was hypoxic and required BiPAP, however patient quickly improved and was able to be transitioned to oxygen via nasal cannula. With continued administration of antibiotics and other supportive measures, on discharge O2 saturation was in the mid 90s on room air. As a result patient was not discharged on any antibiotics.  Right hip fracture -Up on further obtaining history from the patient, apparently patient was having pain on her right hip area for a few weeks prior to admission. She had a x-ray done as an outpatient and was scheduled to see a orthopedic surgeon. As a result we consulted [place] [facility]-Dr. Magnus Ivan did consult on the patient and did review outpatient x-rays which did suggest a right  femoral neck subacute fracture. Patient at this time does not want to pursue any surgical intervention. Current recommendation is that she should be nonweightbearing on the right lower extremity except to pivot. Home health services have been provided to the patient.   atrial fibrillation -A long discussion was held with patient and patient's family, issue of whether or not to continue Coumadin was discussed in detail. I did recommend that we stop Coumadin and just place patient on aspirin, however patient was very reluctant to come off Coumadin. Patient's brother is a former  physician himself, who suggested that I speak with Dr. Ronne Binning which I did. Further consensus was that at this point since the patient wishes to continue on Coumadin , we will continue however she will followup with her primary care practitioner and discuss this matter once again.   Rest of the medical issues were stable.     TODAY-DAY OF DISCHARGE:  Subjective:   Janice [name] today has no headache,no chest abdominal pain,no new weakness tingling or numbness, feels much better wants to go home today.   Objective:   Blood pressure 136/60, pulse 69, temperature 96.5 F (35.8 C), temperature source Oral, resp. rate 16, height 5\' 7"  (1.702 m), weight 46.267 kg (102 lb), SpO2 94.00%.  Intake/Output Summary (Last 24 hours) at 05/29/11 1829 Last data filed at 05/28/11 2000  Gross per 24 hour  Intake      0 ml  Output    100 ml  Net   -100 ml    Exam Awake Alert, Oriented *3, No new F.N deficits, Normal affect [place].AT,PERRAL Supple Neck,No JVD, No cervical lymphadenopathy appriciated.  Symmetrical Chest wall movement, Good air movement bilaterally, CTAB RRR,No Gallops,Rubs or new Murmurs, No Parasternal Heave +ve B.Sounds, Abd Soft, Non tender, No organomegaly appriciated, No rebound -guarding or rigidity. No Cyanosis, Clubbing or edema, No new Rash or bruise  DISPOSITION: Home-patient and family refused SNF placement as patient has 24/7 care.  DISCHARGE INSTRUCTIONS:    Follow-up Information    Follow up with Thayer Headings, MD. Make an appointment in 2 weeks.        Total Time spent on discharge equals 45 minutes.  SignedJeoffrey Massed 05/29/2011 6:29 PM

## 2011-05-29 NOTE — Progress Notes (Signed)
CARE MANAGEMENT NOTE 05/29/2011  Patient:  DAKSHA, KOONE   Account Number:  [id number]  Date Initiated:  05/29/2011  Documentation initiated by:  [facility]  Subjective/Objective Assessment:   pneumonia, Afib, Sepsis     Action/Plan:   Anticipated DC Date:  05/29/2011   Anticipated DC Plan:  HOME W HOME HEALTH SERVICES      DC Planning Services  CM consult      [facility] Choice  HOME HEALTH   Choice offered to / List presented to:  C-1 Patient        HH arranged  HH-2 PT  HH-1 RN  HH-4 NURSE'S AIDE  HH-3 OT      [facility] agency  [facility]   Status of service:  Completed, signed off Medicare Important Message given?   (If response is "NO", the following Medicare IM given date fields will be blank) Date Medicare IM given:   Date Additional Medicare IM given:    Discharge Disposition:  HOME W HOME HEALTH SERVICES  Per UR Regulation:    Comments:  05/29/2011 1500 Spoke to pt and states she had [place] prior to admission. Had contact information for [place]. CM explained Genevieve Norlander will contact pt to schedule appt. Contacted [facility] and made aware of pt's scheduled d/c today. Provided facesheet, orders, F2F and H&P to [facility] rep. Made rep aware of PT/INR needed on Monday call to Dr. Dimas Millin office. Isidoro Donning RN CCM Case Mgmt phone [phone]

## 2011-05-29 NOTE — Progress Notes (Signed)
Physical Therapy Treatment Patient Details Name: Janice [name] MRN: [id number] DOB: 28-Aug-1921 Today's Date: 05/29/2011  PT Assessment/Plan  PT - Assessment/Plan Comments on Treatment Session: Pt very pleasant & willing to participate in PT session.  Very eager to D/C home today.  Pt & pt's caregive confirm that she will have 24 hr care that can provide adequate level of (A) at home.  Pt deferred LE there-ex due to wanting to finish lunch & prepare for D/C home.   PT Frequency: Min 3X/week Follow Up Recommendations: Home health PT Equipment Recommended: None recommended by PT PT Goals  Acute Rehab PT Goals PT Goal: Sit to Stand - Progress: Progressing toward goal PT Goal: Stand to Sit - Progress: Progressing toward goal PT Transfer Goal: Bed to Chair/Chair to Bed - Progress: Progressing toward goal  PT Treatment Precautions/Restrictions  Precautions Precautions: Fall Restrictions Weight Bearing Restrictions: Yes RLE Weight Bearing: Non weight bearing Other Position/Activity Restrictions: Secondary to right hip fracture Mobility (including Balance) Bed Mobility Bed Mobility: No Transfers Sit to Stand: 3: Mod assist;From chair/3-in-1;Other (comment);With upper extremity assist;With armrests (w/c<>recliner) Sit to Stand Details (indicate cue type and reason): (A) for achieve standing, anterior translation of trunk due to pt with posterior lean, balance.  Max cues for NWB RLE & positioning of RLE to ensure adherence of precaution.   Stand to Sit: 4: Min assist;To chair/3-in-1;Other (comment);With upper extremity assist;With armrests (w/c) Stand to Sit Details: (A) to control descent & position RLE.   Stand Pivot Transfers: 4: Min assist Stand Pivot Transfer Details (indicate cue type and reason): (A) for balance & safety.  Had pt place her right foot on top of this clinicians foot to ensure adherence of precaution.  Pt placing minimal wt through RLE.  Pt's daytime caregive arrived after  1st trial of transfer & this clinician was able to observe & educate for technique to ensure safety.   Ambulation/Gait Ambulation/Gait: No Stairs: No Wheelchair Mobility Wheelchair Mobility: No  Posture/Postural Control Posture/Postural Control: No significant limitations Static Standing Balance Static Standing - Balance Support: Bilateral upper extremity supported Static Standing - Level of Assistance: Other (comment) (Min Guard (A)) Static Standing - Comment/# of Minutes: Continues to require RW for balance but did not need physical (A).   Exercise    End of Session PT - End of Session Equipment Utilized During Treatment: Gait belt Activity Tolerance: Patient tolerated treatment well Patient left: in chair;with family/visitor present General Behavior During Session: [facility] for tasks performed Cognition: [facility] for tasks performed  Lara Mulch 05/29/2011, 1:54 PM

## 2011-05-29 NOTE — Progress Notes (Signed)
Late entry:  Notified by [facility] CMRN  yesterday approximately 3 pm, that patient may desire services of [facility] ([facility] after discharge; that doctor was also wanting HH/PT/OT discussed that [facility] services and hospice services were not usually offered simultaneously as is considered a duplication of services  -pt would choose one or the other -CMRN was to speak with MD and get back; as this RN was not at [facility] to evaluate patient for eligibility -discussed with CMRN several options- could fax information to [facility] and they will follow up to evaluate patient's eligibility once she is home & patient could go home with HH/PT/OT; should patient remain in hospital this RN will f/u on Monday; This RN did make [facility] aware of CMRN inquiry and to possibility of paperwork being sent to [facility] from [facility]. This RN did place additional calls to [facility] to f/u but did not hear back; did discuss above with Dr Jerral Ralph as well.   Valente David, RN 05/29/2011, 7:37 AM [facility] Palliative Medicine Team RN Liaison [phone]

## 2011-05-29 NOTE — Progress Notes (Addendum)
ANTICOAGULATION CONSULT NOTE - Follow Up Consult  Pharmacy Consult for Coumadin Indication: atrial fibrillation  Assessment: 89yoF on coumadin prior to arrival for atrial fibrillation. Home dose per patient is 3mg  (1.5 tablets of 2mg  tablet) everyday except 2mg  (1 tablet of 2mg  tablet) on Tuesdays. Per Dr. Jerral Ralph note, patient will follow up with PCP Dr. Ronne Binning as an outpatient to discuss risks vs benefits of long-term continuation of coumadin therapy. Also Day #4 of Azithromycin and Day # 2 of cefuroxime (had 2 days of ceftriaxone) for pneumonia which can increase INR. Today INR therapeutic after skipping one dose last night (INR yesterday 3.21). H/H 9.9/29.7; no bleeding reported in MD/RN notes.  Goal of Therapy:  INR 2-3   Plan:  1) Coumadin 2mg  today  2) Monitor daily PT/INR.  Benjaman Pott, PharmD     Pager [phone] 05/29/2011   11:00 AM  -----------   No Known Allergies  Patient Measurements: Height: 5\' 7"  (170.2 cm) Weight: 102 lb (46.267 kg) IBW/kg (Calculated) : 61.6   Vital Signs: Temp: 96.5 F (35.8 C) (01/05 0502) Temp src: Oral (01/05 0502) BP: 136/60 mmHg (01/05 0502) Pulse Rate: 69  (01/05 0502)  Labs:  Basename 05/29/11 0645 05/28/11 0601 05/27/11 0625  HGB 9.9* -- 10.1*  HCT 29.7* -- 29.9*  PLT 228 -- 196  APTT -- -- --  LABPROT 24.6* 33.3* 30.2*  INR 2.18* 3.21* 2.83*  HEPARINUNFRC -- -- --  CREATININE -- -- 0.66  CKTOTAL -- -- --  CKMB -- -- --  TROPONINI -- -- --   Estimated Creatinine Clearance: 36.2 ml/min (by C-G formula based on Cr of 0.66).

## 2011-07-01 ENCOUNTER — Encounter: Payer: Self-pay | Admitting: Internal Medicine

## 2011-07-01 DIAGNOSIS — I495 Sick sinus syndrome: Secondary | ICD-10-CM

## 2011-08-09 ENCOUNTER — Encounter: Payer: Self-pay | Admitting: Internal Medicine

## 2011-09-30 ENCOUNTER — Encounter: Payer: Self-pay | Admitting: Internal Medicine

## 2011-09-30 DIAGNOSIS — I495 Sick sinus syndrome: Secondary | ICD-10-CM

## 2011-12-28 ENCOUNTER — Encounter (HOSPITAL_COMMUNITY): Payer: Self-pay | Admitting: *Deleted

## 2011-12-28 ENCOUNTER — Inpatient Hospital Stay (HOSPITAL_COMMUNITY)
Admission: EM | Admit: 2011-12-28 | Discharge: 2011-12-30 | DRG: 195 | Disposition: A | Discharge status: Home Health | Payer: Medicare Other | Attending: Family Medicine | Admitting: Family Medicine

## 2011-12-28 ENCOUNTER — Emergency Department (HOSPITAL_COMMUNITY): Payer: Medicare Other

## 2011-12-28 DIAGNOSIS — M316 Other giant cell arteritis: Secondary | ICD-10-CM | POA: Diagnosis present

## 2011-12-28 DIAGNOSIS — I4891 Unspecified atrial fibrillation: Secondary | ICD-10-CM | POA: Diagnosis present

## 2011-12-28 DIAGNOSIS — J189 Pneumonia, unspecified organism: Secondary | ICD-10-CM

## 2011-12-28 DIAGNOSIS — Z950 Presence of cardiac pacemaker: Secondary | ICD-10-CM

## 2011-12-28 DIAGNOSIS — Z7901 Long term (current) use of anticoagulants: Secondary | ICD-10-CM

## 2011-12-28 DIAGNOSIS — Z79899 Other long term (current) drug therapy: Secondary | ICD-10-CM

## 2011-12-28 DIAGNOSIS — E039 Hypothyroidism, unspecified: Secondary | ICD-10-CM | POA: Diagnosis present

## 2011-12-28 DIAGNOSIS — K219 Gastro-esophageal reflux disease without esophagitis: Secondary | ICD-10-CM | POA: Diagnosis present

## 2011-12-28 DIAGNOSIS — I509 Heart failure, unspecified: Secondary | ICD-10-CM | POA: Diagnosis present

## 2011-12-28 DIAGNOSIS — E876 Hypokalemia: Secondary | ICD-10-CM | POA: Diagnosis present

## 2011-12-28 DIAGNOSIS — D649 Anemia, unspecified: Secondary | ICD-10-CM

## 2011-12-28 HISTORY — DX: Fracture of unspecified part of neck of unspecified femur, initial encounter for closed fracture: S72.009A

## 2011-12-28 HISTORY — DX: Presence of cardiac pacemaker: Z95.0

## 2011-12-28 LAB — COMPREHENSIVE METABOLIC PANEL
Alkaline Phosphatase: 81 U/L (ref 39–117)
BUN: 24 mg/dL — ABNORMAL HIGH (ref 6–23)
CO2: 27 mEq/L (ref 19–32)
Chloride: 97 mEq/L (ref 96–112)
Creatinine, Ser: 0.76 mg/dL (ref 0.50–1.10)
GFR calc non Af Amer: 73 mL/min — ABNORMAL LOW (ref >90)
Glucose, Bld: 155 mg/dL — ABNORMAL HIGH (ref 70–99)
Potassium: 4.4 mEq/L (ref 3.5–5.1)
Total Bilirubin: 0.5 mg/dL (ref 0.3–1.2)

## 2011-12-28 LAB — URINALYSIS, ROUTINE W REFLEX MICROSCOPIC
Bilirubin Urine: NEGATIVE
Hgb urine dipstick: NEGATIVE
Nitrite: NEGATIVE
Protein, ur: NEGATIVE mg/dL
Specific Gravity, Urine: 1.024 (ref 1.005–1.030)
Urobilinogen, UA: 1 mg/dL (ref 0.0–1.0)

## 2011-12-28 LAB — CBC WITH DIFFERENTIAL/PLATELET
HCT: 42.3 % (ref 36.0–46.0)
Hemoglobin: 14.6 g/dL (ref 12.0–15.0)
Lymphocytes Relative: 16 % (ref 12–46)
Lymphs Abs: 1.1 10*3/uL (ref 0.7–4.0)
MCHC: 34.5 g/dL (ref 30.0–36.0)
Monocytes Absolute: 0.1 10*3/uL (ref 0.1–1.0)
Monocytes Relative: 1 % — ABNORMAL LOW (ref 3–12)
Neutro Abs: 5.3 10*3/uL (ref 1.7–7.7)
RBC: 4.47 MIL/uL (ref 3.87–5.11)
WBC: 6.5 10*3/uL (ref 4.0–10.5)

## 2011-12-28 LAB — POCT I-STAT TROPONIN I

## 2011-12-28 LAB — PROTIME-INR: Prothrombin Time: 32.2 seconds — ABNORMAL HIGH (ref 11.6–15.2)

## 2011-12-28 LAB — HEMOGLOBIN A1C: Mean Plasma Glucose: 114 mg/dL (ref <117)

## 2011-12-28 MED ORDER — WARFARIN SODIUM 1 MG PO TABS
1.0000 mg | ORAL_TABLET | Freq: Once | ORAL | Status: AC
  Administered 2011-12-28: 1 mg via ORAL
  Filled 2011-12-28: qty 1

## 2011-12-28 MED ORDER — SODIUM CHLORIDE 0.9 % IV BOLUS (SEPSIS)
500.0000 mL | Freq: Once | INTRAVENOUS | Status: AC
  Administered 2011-12-28: 500 mL via INTRAVENOUS

## 2011-12-28 MED ORDER — SODIUM CHLORIDE 0.9 % IV BOLUS (SEPSIS): 500.0000 mL | Freq: Once | INTRAVENOUS | Status: DC

## 2011-12-28 MED ORDER — HYDROCODONE-ACETAMINOPHEN 5-325 MG PO TABS
0.5000 | ORAL_TABLET | ORAL | Status: DC | PRN
  Administered 2011-12-29 – 2011-12-30 (×2): 1 via ORAL
  Filled 2011-12-28 (×2): qty 1

## 2011-12-28 MED ORDER — SODIUM CHLORIDE 0.9 % IJ SOLN
3.0000 mL | Freq: Two times a day (BID) | INTRAMUSCULAR | Status: DC
  Administered 2011-12-28 – 2011-12-30 (×4): 3 mL via INTRAVENOUS

## 2011-12-28 MED ORDER — AZITHROMYCIN 500 MG IV SOLR
500.0000 mg | INTRAVENOUS | Status: DC
  Administered 2011-12-29 – 2011-12-30 (×2): 500 mg via INTRAVENOUS
  Filled 2011-12-28 (×2): qty 500

## 2011-12-28 MED ORDER — ALBUTEROL SULFATE (5 MG/ML) 0.5% IN NEBU
2.5000 mg | INHALATION_SOLUTION | Freq: Once | RESPIRATORY_TRACT | Status: AC
  Administered 2011-12-28: 2.5 mg via RESPIRATORY_TRACT
  Filled 2011-12-28: qty 0.5

## 2011-12-28 MED ORDER — ONDANSETRON HCL 4 MG/2ML IJ SOLN
4.0000 mg | Freq: Once | INTRAMUSCULAR | Status: AC
  Administered 2011-12-28: 4 mg via INTRAVENOUS
  Filled 2011-12-28: qty 2

## 2011-12-28 MED ORDER — SODIUM CHLORIDE 0.9 % IV BOLUS (SEPSIS)
250.0000 mL | Freq: Once | INTRAVENOUS | Status: AC
  Administered 2011-12-28: 250 mL via INTRAVENOUS

## 2011-12-28 MED ORDER — SODIUM CHLORIDE 0.9 % IJ SOLN: 3.0000 mL | Freq: Two times a day (BID) | INTRAMUSCULAR | Status: DC

## 2011-12-28 MED ORDER — FUROSEMIDE 20 MG PO TABS
20.0000 mg | ORAL_TABLET | Freq: Every day | ORAL | Status: DC
  Filled 2011-12-28: qty 1

## 2011-12-28 MED ORDER — ACETAMINOPHEN 325 MG PO TABS
650.0000 mg | ORAL_TABLET | Freq: Once | ORAL | Status: AC
  Administered 2011-12-28: 650 mg via ORAL
  Filled 2011-12-28: qty 2

## 2011-12-28 MED ORDER — FOLIC ACID 1 MG PO TABS
1.0000 mg | ORAL_TABLET | Freq: Every day | ORAL | Status: DC
  Administered 2011-12-28 – 2011-12-30 (×3): 1 mg via ORAL
  Filled 2011-12-28 (×3): qty 1

## 2011-12-28 MED ORDER — DEXTROSE 5 % IV SOLN
500.0000 mg | Freq: Once | INTRAVENOUS | Status: AC
  Administered 2011-12-28: 500 mg via INTRAVENOUS
  Filled 2011-12-28: qty 500

## 2011-12-28 MED ORDER — PREDNISONE 10 MG PO TABS
10.0000 mg | ORAL_TABLET | Freq: Every day | ORAL | Status: DC
  Administered 2011-12-28 – 2011-12-30 (×3): 10 mg via ORAL
  Filled 2011-12-28 (×3): qty 1

## 2011-12-28 MED ORDER — ACETAMINOPHEN 325 MG PO TABS: 650.0000 mg | ORAL_TABLET | Freq: Four times a day (QID) | ORAL | Status: DC | PRN

## 2011-12-28 MED ORDER — DEXTROSE 5 % IV SOLN
1.0000 g | Freq: Once | INTRAVENOUS | Status: AC
  Administered 2011-12-28: 1 g via INTRAVENOUS
  Filled 2011-12-28: qty 10

## 2011-12-28 MED ORDER — HYDROCODONE-ACETAMINOPHEN 5-325 MG PO TABS
1.0000 | ORAL_TABLET | Freq: Four times a day (QID) | ORAL | Status: DC | PRN
  Administered 2011-12-28: 1 via ORAL
  Filled 2011-12-28: qty 1

## 2011-12-28 MED ORDER — LEVOTHYROXINE SODIUM 50 MCG PO TABS
50.0000 ug | ORAL_TABLET | Freq: Every day | ORAL | Status: DC
  Administered 2011-12-28 – 2011-12-30 (×3): 50 ug via ORAL
  Filled 2011-12-28 (×3): qty 1

## 2011-12-28 MED ORDER — BISOPROLOL FUMARATE 10 MG PO TABS
10.0000 mg | ORAL_TABLET | Freq: Two times a day (BID) | ORAL | Status: DC
  Filled 2011-12-28 (×2): qty 1

## 2011-12-28 MED ORDER — SODIUM CHLORIDE 0.9 % IV SOLN
INTRAVENOUS | Status: DC
  Administered 2011-12-28: 1000 mL via INTRAVENOUS
  Administered 2011-12-28 – 2011-12-29 (×2): via INTRAVENOUS

## 2011-12-28 MED ORDER — ACETAMINOPHEN 650 MG RE SUPP: 650.0000 mg | Freq: Four times a day (QID) | RECTAL | Status: DC | PRN

## 2011-12-28 MED ORDER — SIMVASTATIN 5 MG PO TABS
5.0000 mg | ORAL_TABLET | Freq: Every day | ORAL | Status: DC
  Administered 2011-12-28 – 2011-12-29 (×2): 5 mg via ORAL
  Filled 2011-12-28 (×3): qty 1

## 2011-12-28 MED ORDER — DEXTROSE 5 % IV SOLN
1.0000 g | INTRAVENOUS | Status: DC
  Administered 2011-12-29 – 2011-12-30 (×2): 1 g via INTRAVENOUS
  Filled 2011-12-28 (×2): qty 10

## 2011-12-28 MED ORDER — WARFARIN - PHARMACIST DOSING INPATIENT: Freq: Every day | Status: DC

## 2011-12-28 MED ORDER — SODIUM CHLORIDE 0.9 % IJ SOLN: 3.0000 mL | INTRAMUSCULAR | Status: DC | PRN

## 2011-12-28 MED ORDER — SODIUM CHLORIDE 0.9 % IV SOLN: 250.0000 mL | INTRAVENOUS | Status: DC | PRN

## 2011-12-28 MED ORDER — POTASSIUM CHLORIDE CRYS ER 20 MEQ PO TBCR
20.0000 meq | EXTENDED_RELEASE_TABLET | Freq: Every day | ORAL | Status: DC
  Administered 2011-12-28 – 2011-12-30 (×3): 20 meq via ORAL
  Filled 2011-12-28 (×3): qty 1
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
## 2011-12-28 NOTE — ED Notes (Signed)
Per EMS. Pt is from home. Pt has 24 hour home health. Pt here for pain on ribs on right side. Increased pain with breathing.  CTAB per EMS.  Upper respiratory symptoms.  02 Sat in mid 90s.  Pt has pacemaker- unremarkable.

## 2011-12-28 NOTE — ED Notes (Signed)
Pt vomited x1 upon arrival to ER. Pt changed and placed in clean gown and sheets.

## 2011-12-28 NOTE — ED Notes (Signed)
Report received-airway intact-no s/s's of distress-will continue to monitor 

## 2011-12-28 NOTE — ED Notes (Signed)
Per Darl Pikes, ICU RN, Dr. Cena Benton will reassess patient for step down bed

## 2011-12-28 NOTE — ED Notes (Signed)
Pt taken to xray 

## 2011-12-28 NOTE — ED Notes (Signed)
Report given to Darl Pikes RN-placing a call to attending and will call me back

## 2011-12-28 NOTE — Progress Notes (Signed)
Patient was admitted earlier this morning for pneumonia.  Agree with Associates (Dr. Pixie Casino) plan and assesment.  Please review his H and P for further details.  Given that her vitals are improved and ICU beds are limited per conversation with Elpidio Galea, I will go ahead and change bed to Telemetry where she can be monitored closely there.   Otherwise continue antibiotics for CAP, prednisone for temporal arteritis, coumadin for afib. Will reassess tomorrow.  Trevan Messman, [employer]
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
## 2011-12-28 NOTE — ED Notes (Signed)
WUJ:[id number]<JY> Expected date:<BR> Expected time:<BR> Means of arrival:<BR> Comments:<BR> EMS/elderly-rib cage pain and possible fever

## 2011-12-28 NOTE — ED Notes (Signed)
MD Moplus made aware of patient vital signs and condition.

## 2011-12-28 NOTE — Progress Notes (Signed)
ANTICOAGULATION CONSULT NOTE - Initial Consult  Pharmacy Consult for Warfarin Indication: atrial fibrillation  No Known Allergies  Patient Measurements:     Vital Signs: Temp: 102.1 F (38.9 C) (08/06 0407) Temp src: Rectal (08/06 0407) BP: 108/52 mmHg (08/06 0407) Pulse Rate: 82  (08/06 0419)  Labs:  Basename 12/28/11 0330  HGB 14.6  HCT 42.3  PLT 264  APTT --  LABPROT --  INR --  HEPARINUNFRC --  CREATININE 0.76  CKTOTAL --  CKMB --  TROPONINI <0.30    The CrCl is unknown because both a height and weight (above a minimum accepted value) are required for this calculation.   Medical History: Past Medical History  Diagnosis Date  . Hypokalemia   . GERD (gastroesophageal reflux disease)   . Temporal arteritis   . Atrial fibrillation   . Hypothyroidism   . Hyponatremia   . Angina   . Shortness of breath   . Hip fracture   . Pacemaker     Medications:  Scheduled:    . acetaminophen  650 mg Oral Once  . albuterol  2.5 mg Nebulization Once  . azithromycin  500 mg Intravenous Once  . azithromycin  500 mg Intravenous Q24H  . cefTRIAXone (ROCEPHIN)  IV  1 g Intravenous Once  . cefTRIAXone (ROCEPHIN)  IV  1 g Intravenous Q24H  . ondansetron (ZOFRAN) IV  4 mg Intravenous Once   Infusions:    . sodium chloride 125 mL/hr at 12/28/11 0602    Assessment:  Janice [name] admit with pneumonia and with history of PAfib on chronic warfarin  PTA: warfarin 3mg  daily except on Wednesday takes 2mg . Last Dose: 12/27/2011 - 3mg .   INR is slightly above goal at 3.07 today  CBC is wnl  Goal of Therapy:  INR 2-3   Plan:   Low dose warfarin 1mg  PO today at 1800  Daily INR   Lynann Beaver PharmD, BCPS Pager [phone] 12/28/2011 7:14 AM

## 2011-12-28 NOTE — ED Provider Notes (Signed)
Medical screening examination/treatment/procedure(s) were conducted as a shared visit with non-physician practitioner(s) and myself.  I personally evaluated the patient during the encounter  5:47 AM Awake alert no acute distress.  Respirations unlabored. Rales in bases greater on right. Patient advised of x-ray findings and plan to admit for pneumonia.  Hanley Seamen, MD 12/28/11 [phone]

## 2011-12-28 NOTE — ED Notes (Signed)
Was able to draw i-Stat troponin but unable to get INR.

## 2011-12-28 NOTE — Care Management Note (Signed)
    Page 1 of 1   12/30/2011     1:23:56 PM   CARE MANAGEMENT NOTE 12/30/2011  Patient:  Janice [name], Janice [name]   Account Number:  [id number]  Date Initiated:  12/28/2011  Documentation initiated by:  Lanier Clam  Subjective/Objective Assessment:   ADMITTED W/SOB.     Action/Plan:   FROM HOME ALONE.COMFORT KEEPERS-NURSE'S AIDE-24HR CARE.HAS RW,W/C.USED [facility] IN PAST.   Anticipated DC Date:  12/30/2011   Anticipated DC Plan:  HOME W HOME HEALTH SERVICES      DC Planning Services  CM consult      Choice offered to / List presented to:  C-1 Patient        HH arranged  HH-1 RN  HH-2 PT      [facility] agency  [facility]   Status of service:  Completed, signed off Medicare Important Message given?   (If response is "NO", the following Medicare IM given date fields will be blank) Date Medicare IM given:   Date Additional Medicare IM given:    Discharge Disposition:  HOME W HOME HEALTH SERVICES  Per UR Regulation:  Reviewed for med. necessity/level of care/duration of stay  If discussed at Long Length of Stay Meetings, dates discussed:    Comments:  12/30/11 Jahyra Sukup RN,BSN NCM [phone] RECOMMEND HHRN/PT. 12/28/11 Grae Leathers RN,BSN NCM [phone] PATIENT PREFERS HOME @ D/C,PLEASANTLY DECLINED SNF.[facility]) INFORMED OF REFERRAL IF HOME @ HH NEEDED.

## 2011-12-28 NOTE — ED Notes (Signed)
Patient had another episode of vomiting in x-ray.  X-ray tech cleaned pt and changed bedding

## 2011-12-29 DIAGNOSIS — I4891 Unspecified atrial fibrillation: Secondary | ICD-10-CM

## 2011-12-29 DIAGNOSIS — D649 Anemia, unspecified: Secondary | ICD-10-CM

## 2011-12-29 DIAGNOSIS — M316 Other giant cell arteritis: Secondary | ICD-10-CM

## 2011-12-29 LAB — COMPREHENSIVE METABOLIC PANEL
ALT: 11 U/L (ref 0–35)
AST: 16 U/L (ref 0–37)
Albumin: 2.3 g/dL — ABNORMAL LOW (ref 3.5–5.2)
Alkaline Phosphatase: 50 U/L (ref 39–117)
BUN: 24 mg/dL — ABNORMAL HIGH (ref 6–23)
CO2: 25 mEq/L (ref 19–32)
Calcium: 7.9 mg/dL — ABNORMAL LOW (ref 8.4–10.5)
Chloride: 104 mEq/L (ref 96–112)
Creatinine, Ser: 0.72 mg/dL (ref 0.50–1.10)
GFR calc Af Amer: 86 mL/min — ABNORMAL LOW (ref >90)
GFR calc non Af Amer: 74 mL/min — ABNORMAL LOW (ref >90)
Glucose, Bld: 92 mg/dL (ref 70–99)
Potassium: 4.1 mEq/L (ref 3.5–5.1)
Sodium: 134 mEq/L — ABNORMAL LOW (ref 135–145)
Total Bilirubin: 0.4 mg/dL (ref 0.3–1.2)
Total Protein: 4.6 g/dL — ABNORMAL LOW (ref 6.0–8.3)

## 2011-12-29 LAB — CBC
HCT: 28.8 % — ABNORMAL LOW (ref 36.0–46.0)
Hemoglobin: 9.6 g/dL — ABNORMAL LOW (ref 12.0–15.0)
MCH: 31.9 pg (ref 26.0–34.0)
MCHC: 33.3 g/dL (ref 30.0–36.0)
RBC: 3.01 MIL/uL — ABNORMAL LOW (ref 3.87–5.11)

## 2011-12-29 LAB — PROTIME-INR
INR: 3.36 — ABNORMAL HIGH (ref 0.00–1.49)
Prothrombin Time: 34.5 seconds — ABNORMAL HIGH (ref 11.6–15.2)

## 2011-12-29 LAB — DIFFERENTIAL
Basophils Absolute: 0 10*3/uL (ref 0.0–0.1)
Eosinophils Absolute: 0 10*3/uL (ref 0.0–0.7)
Eosinophils Relative: 0 % (ref 0–5)
Lymphs Abs: 0.8 10*3/uL (ref 0.7–4.0)
Neutrophils Relative %: 88 % — ABNORMAL HIGH (ref 43–77)

## 2011-12-29 MED ORDER — FUROSEMIDE 20 MG PO TABS
20.0000 mg | ORAL_TABLET | Freq: Every day | ORAL | Status: DC
  Administered 2011-12-29 – 2011-12-30 (×2): 20 mg via ORAL
  Filled 2011-12-29 (×2): qty 1

## 2011-12-29 MED ORDER — BISOPROLOL FUMARATE 10 MG PO TABS
10.0000 mg | ORAL_TABLET | Freq: Two times a day (BID) | ORAL | Status: DC
  Administered 2011-12-29 – 2011-12-30 (×2): 10 mg via ORAL
  Filled 2011-12-29 (×3): qty 1

## 2011-12-29 NOTE — Clinical Documentation Improvement (Signed)
MALNUTRITION DOCUMENTATION CLARIFICATION  THIS DOCUMENT IS NOT A PERMANENT PART OF THE MEDICAL RECORD  TO RESPOND TO THE THIS QUERY, FOLLOW THE INSTRUCTIONS BELOW:  1. If needed, update documentation for the patient's encounter via the notes activity.  2. Access this query again and click edit on the In [employer].  3. After updating, or not, click F2 to complete all highlighted (required) fields concerning your review. Select "additional documentation in the medical record" OR "no additional documentation provided".  4. Click Sign note button.  5. The deficiency will fall out of your In Basket *Please let us know if you are not able to complete this workflow by phone or e-mail (listed below).  Please update your documentation within the medical record to reflect your response to this query.                                                                                        12/29/11   Dear Dr. Cena Benton, [facility],  In a better effort to capture your patient's severity of illness, reflect appropriate length of stay and utilization of resources, a review of the patient medical record has revealed the following indicators.    Based on your clinical judgment, please clarify and document in a progress note and/or discharge summary the clinical condition associated with the following supporting information:  In responding to this query please exercise your independent judgment.  The fact that a query is asked, does not imply that any particular answer is desired or expected.  Pt's BMI=  15.77    Please clarify whether or not BMI can be linked to one of he diagnoses listed below and document in pn  and d/c. Thank You!  BEST PRACTICE: When linking BMI to a diagnosis please document both BMI and diagnosis together in pn for accuracy of severity of illness (SOI) and risk of mortality (ROM).    Possible Clinical Conditions?   _______Severe Malnutrition   _______Protein Calorie  Malnutrition _______Severe Protein Calorie Malnutrition  _______Other Condition________________ _______Cannot clinically determine     Supporting Information: Risk Factors: Pneumonia, cap, PAFIB, CHF,   Signs & Symptoms: Ht 5\' 8"  Wt 103 lbs  BMI: 15.77    Diagnostics: Component     Latest Ref Rng 12/29/2011          Total Protein     6.0 - 8.3 g/dL 4.6 (L)  Albumin     3.5 - 5.2 g/dL 2.3 (L)    Treatment  Heart diet  I & O   You may use possible, probable, or suspect with inpatient documentation. possible, probable, suspected diagnoses MUST be documented at the time of discharge  Reviewed:  no additional documentation provided  Thank You,  Enis Slipper  RN, BSN, MSN/Inf, CCDS Clinical Documentation Specialist [facility] Dept Pager: [phone] / E-mail: [email or web address]  Health Information Management [place]  Suspect protein calorie malnutrition

## 2011-12-29 NOTE — Progress Notes (Signed)
ANTICOAGULATION CONSULT NOTE - Follow-up Consult  Pharmacy Consult for Warfarin Indication: atrial fibrillation  No Known Allergies  Patient Measurements: Height: 5\' 8"  (172.7 cm) Weight: 103 lb 11.2 oz (47.038 kg) IBW/kg (Calculated) : 63.9    Vital Signs: Temp: 98.1 F (36.7 C) (08/07 0623) Temp src: Oral (08/07 0623) BP: 103/56 mmHg (08/07 0623) Pulse Rate: 82  (08/07 0623)  Labs:  Basename 12/29/11 0630 12/28/11 0920 12/28/11 0330  HGB 9.6* -- 14.6  HCT 28.8* -- 42.3  PLT 166 -- 264  APTT -- -- --  LABPROT 34.5* 32.2* --  INR 3.36* 3.07* --  HEPARINUNFRC -- -- --  CREATININE 0.72 -- 0.76  CKTOTAL -- -- --  CKMB -- -- --  TROPONINI -- -- <0.30    Estimated Creatinine Clearance: 36.1 ml/min (by C-G formula based on Cr of 0.72).   Medical History: Past Medical History  Diagnosis Date  . Hypokalemia   . GERD (gastroesophageal reflux disease)   . Temporal arteritis   . Atrial fibrillation   . Hypothyroidism   . Hyponatremia   . Angina   . Shortness of breath   . Hip fracture   . Pacemaker     Medications:  Scheduled:     . azithromycin  500 mg Intravenous Q24H  . cefTRIAXone (ROCEPHIN)  IV  1 g Intravenous Q24H  . folic acid  1 mg Oral Daily  . levothyroxine  50 mcg Oral Daily  . potassium chloride SA  20 mEq Oral Daily  . predniSONE  10 mg Oral Daily  . simvastatin  5 mg Oral QHS  . sodium chloride  250 mL Intravenous Once  . sodium chloride  250 mL Intravenous Once  . sodium chloride  500 mL Intravenous Once  . sodium chloride  3 mL Intravenous Q12H  . sodium chloride  3 mL Intravenous Q12H  . warfarin  1 mg Oral ONCE-1800  . Warfarin - Pharmacist Dosing Inpatient   Does not apply q1800  . DISCONTD: bisoprolol  10 mg Oral BID  . DISCONTD: furosemide  20 mg Oral Daily  . DISCONTD: sodium chloride  500 mL Intravenous Once   Infusions:     . sodium chloride 80 mL/hr at 12/28/11 1308    Assessment:  89 YOF admitted with pneumonia and  with history of PAfib on chronic warfarin  PTA: warfarin 3mg  daily except 2mg  on Wednesday.   INR supratherapeutic - increased overnight despite low dose of Coumadin 1mg .   Pt also on azithromycin and ceftriaxone: macrolides and cephalosporins can cause elevations in INR.    Hgb fell 14.6-->9.6, plts also trending down. Nurse reports 2 episodes of bloody stool overnight.   Goal of Therapy:  INR 2-3   Plan:   Hold Coumadin tonight.   F/u Daily PT/INR, CBC

## 2011-12-29 NOTE — Progress Notes (Signed)
[facility] PROGRESS NOTE  Janice [name] WUJ:[id number] DOB: 1921/07/27 DOA: 12/28/2011 PCP: Thayer Headings, MD  Assessment/Plan: 1. CAP: Improving. Continue antibiotics, oxygen. Consider outpatient chest CT to exclude central lesion. 2. Normocytic anemia: Probably dilutional. Likely at baseline. Check CBC in AM.  3. Paroxysmal afib: Warfarin per pharmacy. Resume [place].  4. Temporal arteritis: Stable. Continue prednisone. 5. Hypothyroidism: Continue Synthroid.  Code Status: DNR Family Communication: none bedside (discussed with caregiver) Disposition Plan: Home 8/8.  Brendia Sacks, MD  [facility] Team 4 Pager [phone]. If 8PM-8AM, please contact night-coverage at [email or web address], password [facility] 12/29/2011, 5:51 PM  LOS: 1 day   Brief narrative: 78 year old woman admitted for pneumonia.  Consultants:  none  Procedures:  none  Antibiotics:  Zithromax  Rocephin  HPI/Subjective: Feels much better. Breathing better.  Objective: Filed Vitals:   12/28/11 1852 12/28/11 2219 12/29/11 0623 12/29/11 1458  BP: 111/52 96/55 103/56 108/57  Pulse:  78 82 70  Temp:  97.4 F (36.3 C) 98.1 F (36.7 C) 97.8 F (36.6 C)  TempSrc:  Oral Oral Oral  Resp:  21 20 20   Height:      Weight:   47.038 kg (103 lb 11.2 oz)   SpO2:  99% 99% 97%    Intake/Output Summary (Last 24 hours) at 12/29/11 1751 Last data filed at 12/29/11 1459  Gross per 24 hour  Intake    663 ml  Output    900 ml  Net   -237 ml   Wt Readings from Last 3 Encounters:  12/29/11 47.038 kg (103 lb 11.2 oz)  05/26/11 46.267 kg (102 lb)  12/28/10 47.628 kg (105 lb)    Exam:   General:  Appears well.  Cardiovascular: RRR, no m/r/g. No LE edema  Respiratory: Right posterior rhonchi, no w/rales. Normal respiratory effort.  Data Reviewed: Basic Metabolic Panel:  Lab 12/29/11 8657 12/28/11 0330  NA 134* 135  K 4.1 4.4  CL 104 97  CO2 25 27  GLUCOSE 92 155*  BUN 24* 24*  CREATININE 0.72  0.76  CALCIUM 7.9* 9.0  MG -- --  PHOS -- --   Liver Function Tests:  Lab 12/29/11 0630 12/28/11 0330  AST 16 34  ALT 11 21  ALKPHOS 50 81  BILITOT 0.4 0.5  PROT 4.6* 6.6  ALBUMIN 2.3* 3.4*   CBC:  Lab 12/29/11 0630 12/28/11 0330  WBC 11.1* 6.5  NEUTROABS 9.8* 5.3  HGB 9.6* 14.6  HCT 28.8* 42.3  MCV 95.7 94.6  PLT 166 264   Cardiac Enzymes:  Lab 12/28/11 0330  CKTOTAL --  CKMB --  CKMBINDEX --  TROPONINI <0.30    Recent Results (from the past 240 hour(s))  CULTURE, BLOOD (ROUTINE X 2)     Status: Normal (Preliminary result)   Collection Time   12/28/11  4:00 AM      Component Value Range Status Comment   Specimen Description BLOOD LEFT ANTECUBITAL   Final    Special Requests BOTTLES DRAWN AEROBIC AND ANAEROBIC 5CC   Final    Culture  Setup Time 12/28/2011 08:52   Final    Culture     Final    Value:        BLOOD CULTURE RECEIVED NO GROWTH TO DATE CULTURE WILL BE HELD FOR 5 DAYS BEFORE ISSUING A FINAL NEGATIVE REPORT   Report Status PENDING   Incomplete   CULTURE, BLOOD (ROUTINE X 2)     Status: Normal (Preliminary result)   Collection Time  12/28/11  4:00 AM      Component Value Range Status Comment   Specimen Description BLOOD RIGHT ANTECUBITAL   Final    Special Requests BOTTLES DRAWN AEROBIC AND ANAEROBIC 4CC   Final    Culture  Setup Time 12/28/2011 08:52   Final    Culture     Final    Value:        BLOOD CULTURE RECEIVED NO GROWTH TO DATE CULTURE WILL BE HELD FOR 5 DAYS BEFORE ISSUING A FINAL NEGATIVE REPORT   Report Status PENDING   Incomplete     Studies: Dg Chest 2 View  12/28/2011  *RADIOLOGY REPORT*  Clinical Data: Vomiting and cough.  Chest pain.  Fever.  Chills.  CHEST - 2 VIEW  Comparison: 05/26/2011  Findings: The stable appearance of postoperative changes in the mediastinum and cardiac pacemaker.  Normal heart size and pulmonary vascularity.  Calcified and tortuous aorta.  Emphysematous changes and fibrosis in the lungs.  Persistent airspace  consolidation in the right lung base suggesting pneumonia.  Appearance is similar to previous study.  Central obstructing lesion is not entirely excluded and follow-up with CT is recommended after resolution of acute symptoms.  Degenerative changes in the thoracic spine. Compression of T12, stable.  IMPRESSION: Emphysematous changes in the lungs.  Consolidation in the right lower lung suggesting pneumonia.  Follow-up with chest CT is recommended to exclude central obstructing lesion.  Original Report Authenticated By: Marlon Pel, M.D.    Scheduled Meds:   . azithromycin  500 mg Intravenous Q24H  . cefTRIAXone (ROCEPHIN)  IV  1 g Intravenous Q24H  . folic acid  1 mg Oral Daily  . levothyroxine  50 mcg Oral Daily  . potassium chloride SA  20 mEq Oral Daily  . predniSONE  10 mg Oral Daily  . simvastatin  5 mg Oral QHS  . sodium chloride  250 mL Intravenous Once  . sodium chloride  3 mL Intravenous Q12H  . sodium chloride  3 mL Intravenous Q12H  . Warfarin - Pharmacist Dosing Inpatient   Does not apply q1800   Continuous Infusions:   . sodium chloride 80 mL/hr at 12/29/11 1141    Principal Problem:  *PNA (pneumonia) Active Problems:  HYPOTHYROIDISM  Atrial fibrillation  TEMPORAL ARTERITIS  Normocytic anemia     Brendia Sacks, MD  [facility] Team 4 Pager [phone]. If 8PM-8AM, please contact night-coverage at [email or web address], password [facility] 12/29/2011, 5:51 PM  LOS: 1 day   Time spent: 20 minutes

## 2011-12-30 LAB — CBC
HCT: 32.1 % — ABNORMAL LOW (ref 36.0–46.0)
MCH: 32.1 pg (ref 26.0–34.0)
MCHC: 33.6 g/dL (ref 30.0–36.0)
MCV: 95.5 fL (ref 78.0–100.0)
Platelets: 179 10*3/uL (ref 150–400)
RDW: 15.2 % (ref 11.5–15.5)

## 2011-12-30 MED ORDER — AZITHROMYCIN 500 MG PO TABS: 500.0000 mg | ORAL_TABLET | Freq: Every day | ORAL | Status: DC

## 2011-12-30 MED ORDER — AMOXICILLIN-POT CLAVULANATE 875-125 MG PO TABS: 1.0000 | ORAL_TABLET | Freq: Two times a day (BID) | ORAL | Status: DC

## 2011-12-30 MED ORDER — AZITHROMYCIN 500 MG PO TABS: 500.0000 mg | ORAL_TABLET | Freq: Every day | ORAL | Status: AC

## 2011-12-30 MED ORDER — WARFARIN SODIUM 2 MG PO TABS
2.0000 mg | ORAL_TABLET | Freq: Once | ORAL | Status: DC
  Filled 2011-12-30: qty 1

## 2011-12-30 MED ORDER — AMOXICILLIN-POT CLAVULANATE 875-125 MG PO TABS: 1.0000 | ORAL_TABLET | Freq: Two times a day (BID) | ORAL | Status: AC

## 2011-12-30 NOTE — Progress Notes (Signed)
ANTICOAGULATION CONSULT NOTE - Follow-up Consult  Pharmacy Consult for Warfarin Indication: atrial fibrillation  No Known Allergies  Patient Measurements: Height: 5\' 8"  (172.7 cm) Weight: 104 lb 11.5 oz (47.5 kg) IBW/kg (Calculated) : 63.9    Vital Signs: Temp: 98.4 F (36.9 C) (08/08 0553) Temp src: Oral (08/08 0553) BP: 129/72 mmHg (08/08 0553) Pulse Rate: 70  (08/08 0553)  Labs:  Basename 12/30/11 0417 12/29/11 0630 12/28/11 0920 12/28/11 0330  HGB 10.8* 9.6* -- --  HCT 32.1* 28.8* -- 42.3  PLT 179 166 -- 264  APTT -- -- -- --  LABPROT 25.4* 34.5* 32.2* --  INR 2.27* 3.36* 3.07* --  HEPARINUNFRC -- -- -- --  CREATININE -- 0.72 -- 0.76  CKTOTAL -- -- -- --  CKMB -- -- -- --  TROPONINI -- -- -- <0.30    Estimated Creatinine Clearance: 36.4 ml/min (by C-G formula based on Cr of 0.72).   Medical History: Past Medical History  Diagnosis Date  . Hypokalemia   . GERD (gastroesophageal reflux disease)   . Temporal arteritis   . Atrial fibrillation   . Hypothyroidism   . Hyponatremia   . Angina   . Shortness of breath   . Hip fracture   . Pacemaker     Medications:  Scheduled:     . azithromycin  500 mg Intravenous Q24H  . bisoprolol  10 mg Oral BID  . cefTRIAXone (ROCEPHIN)  IV  1 g Intravenous Q24H  . folic acid  1 mg Oral Daily  . furosemide  20 mg Oral Daily  . levothyroxine  50 mcg Oral Daily  . potassium chloride SA  20 mEq Oral Daily  . predniSONE  10 mg Oral Daily  . simvastatin  5 mg Oral QHS  . sodium chloride  3 mL Intravenous Q12H  . sodium chloride  3 mL Intravenous Q12H  . Warfarin - Pharmacist Dosing Inpatient   Does not apply q1800   Infusions:     . DISCONTD: sodium chloride Stopped (12/29/11 2118)    Assessment:  53 YOF admitted with pneumonia and with history of PAfib on chronic warfarin  PTA: warfarin 3mg  daily except 2mg  on Wednesday.   INR therapeutic after dose held last night.    Pt on azithromycin and ceftriaxone:  macrolides and cephalosporins can cause elevations in INR - will dose Coumadin conservatively.  Hgb up slightly after large drop yesterday (?dilutional), plts ok. Nurse reports no new episodes of bloody stools.   Goal of Therapy:  INR 2-3   Plan:   Coumadin 2 mg po x 1 tonight  F/u Daily PT/INR, CBC

## 2011-12-30 NOTE — Progress Notes (Signed)
[facility] PROGRESS NOTE  Janice [name] ZOX:[id number] DOB: 06/14/1921 DOA: 12/28/2011 PCP: Thayer Headings, MD  Assessment/Plan: 1. CAP: Improving. Continue antibiotics, oxygen. Consider outpatient chest CT to exclude central lesion. 2. Normocytic anemia: Probably dilutional. Likely at baseline. Check CBC in AM.  3. Paroxysmal atrial fibrillation: Warfarin per pharmacy. Resume Zebeta.  4. Temporal arteritis: Stable. Continue prednisone. 5. Hypothyroidism: Continue Synthroid.  Code Status: DNR Family Communication: none bedside (discussed with caregiver) Disposition Plan: Home 8/8 with HH RN, PT  Brendia Sacks, MD  [facility] Team 4 Pager [phone]. If 8PM-8AM, please contact night-coverage at [email or web address], password [facility] 12/30/2011, 3:28 PM  LOS: 2 days   Brief narrative: 78 year old woman admitted for pneumonia.  Consultants:  none  Procedures:  none  Antibiotics:  Zithromax 8/6>>8/8  Rocephin 8/6>>8/8  HPI/Subjective: Continues to feel better. Ready to go home.  Objective: Filed Vitals:   12/30/11 0855 12/30/11 0918 12/30/11 0953 12/30/11 1201  BP:   97/58 108/58  Pulse:    83  Temp:    97.7 F (36.5 C)  TempSrc:    Axillary  Resp:   20   Height:      Weight:      SpO2: 94% 92% 92% 94%    Intake/Output Summary (Last 24 hours) at 12/30/11 1528 Last data filed at 12/30/11 0919  Gross per 24 hour  Intake 778.67 ml  Output   2175 ml  Net -1396.33 ml   Wt Readings from Last 3 Encounters:  12/30/11 47.5 kg (104 lb 11.5 oz)  05/26/11 46.267 kg (102 lb)  12/28/10 47.628 kg (105 lb)    Exam:   General:  Appears well.  Cardiovascular: RRR, no m/r/g. No LE edema  Respiratory: CTA bilaterally, no w/r/r. Normal respiratory effort.  Data Reviewed: Basic Metabolic Panel:  Lab 12/29/11 8295 12/28/11 0330  NA 134* 135  K 4.1 4.4  CL 104 97  CO2 25 27  GLUCOSE 92 155*  BUN 24* 24*  CREATININE 0.72 0.76  CALCIUM 7.9* 9.0  MG -- --    PHOS -- --   Liver Function Tests:  Lab 12/29/11 0630 12/28/11 0330  AST 16 34  ALT 11 21  ALKPHOS 50 81  BILITOT 0.4 0.5  PROT 4.6* 6.6  ALBUMIN 2.3* 3.4*   CBC:  Lab 12/30/11 0417 12/29/11 0630 12/28/11 0330  WBC 8.7 11.1* 6.5  NEUTROABS -- 9.8* 5.3  HGB 10.8* 9.6* 14.6  HCT 32.1* 28.8* 42.3  MCV 95.5 95.7 94.6  PLT 179 166 264   Cardiac Enzymes:  Lab 12/28/11 0330  CKTOTAL --  CKMB --  CKMBINDEX --  TROPONINI <0.30    Recent Results (from the past 240 hour(s))  CULTURE, BLOOD (ROUTINE X 2)     Status: Normal (Preliminary result)   Collection Time   12/28/11  4:00 AM      Component Value Range Status Comment   Specimen Description BLOOD LEFT ANTECUBITAL   Final    Special Requests BOTTLES DRAWN AEROBIC AND ANAEROBIC 5CC   Final    Culture  Setup Time 12/28/2011 08:52   Final    Culture     Final    Value:        BLOOD CULTURE RECEIVED NO GROWTH TO DATE CULTURE WILL BE HELD FOR 5 DAYS BEFORE ISSUING A FINAL NEGATIVE REPORT   Report Status PENDING   Incomplete   CULTURE, BLOOD (ROUTINE X 2)     Status: Normal (Preliminary result)   Collection Time  12/28/11  4:00 AM      Component Value Range Status Comment   Specimen Description BLOOD RIGHT ANTECUBITAL   Final    Special Requests BOTTLES DRAWN AEROBIC AND ANAEROBIC 4CC   Final    Culture  Setup Time 12/28/2011 08:52   Final    Culture     Final    Value:        BLOOD CULTURE RECEIVED NO GROWTH TO DATE CULTURE WILL BE HELD FOR 5 DAYS BEFORE ISSUING A FINAL NEGATIVE REPORT   Report Status PENDING   Incomplete     Studies: Dg Chest 2 View  12/28/2011  *RADIOLOGY REPORT*  Clinical Data: Vomiting and cough.  Chest pain.  Fever.  Chills.  CHEST - 2 VIEW  Comparison: 05/26/2011  Findings: The stable appearance of postoperative changes in the mediastinum and cardiac pacemaker.  Normal heart size and pulmonary vascularity.  Calcified and tortuous aorta.  Emphysematous changes and fibrosis in the lungs.  Persistent  airspace consolidation in the right lung base suggesting pneumonia.  Appearance is similar to previous study.  Central obstructing lesion is not entirely excluded and follow-up with CT is recommended after resolution of acute symptoms.  Degenerative changes in the thoracic spine. Compression of T12, stable.  IMPRESSION: Emphysematous changes in the lungs.  Consolidation in the right lower lung suggesting pneumonia.  Follow-up with chest CT is recommended to exclude central obstructing lesion.  Original Report Authenticated By: Marlon Pel, M.D.    Scheduled Meds:    . azithromycin  500 mg Intravenous Q24H  . bisoprolol  10 mg Oral BID  . cefTRIAXone (ROCEPHIN)  IV  1 g Intravenous Q24H  . folic acid  1 mg Oral Daily  . furosemide  20 mg Oral Daily  . levothyroxine  50 mcg Oral Daily  . potassium chloride SA  20 mEq Oral Daily  . predniSONE  10 mg Oral Daily  . simvastatin  5 mg Oral QHS  . sodium chloride  3 mL Intravenous Q12H  . sodium chloride  3 mL Intravenous Q12H  . warfarin  2 mg Oral ONCE-1800  . Warfarin - Pharmacist Dosing Inpatient   Does not apply q1800   Continuous Infusions:    . DISCONTD: sodium chloride Stopped (12/29/11 2118)    Principal Problem:  *PNA (pneumonia) Active Problems:  HYPOTHYROIDISM  Atrial fibrillation  TEMPORAL ARTERITIS  Normocytic anemia     Brendia Sacks, MD  [facility] Team 4 Pager [phone]. If 8PM-8AM, please contact night-coverage at [email or web address], password [email or web address] 12/30/2011, 3:28 PM  LOS: 2 days

## 2011-12-30 NOTE — Discharge Summary (Signed)
Physician Discharge Summary  Janice [name] ZOX:[id number] DOB: 04-03-1922 DOA: 12/28/2011  PCP: Janice Headings, MD  Admit date: 12/28/2011 Discharge date: 12/30/2011  Recommendations for Outpatient Follow-up:  1. Follow-up resolution of pneumonia. 2. See chest x-ray report at bottom of note--consider outpatient chest CT as clinically indicated.  Follow-up Information    Follow up with Janice Headings, MD in 1 week.   Contact information:   [address], [address] [place] [address] [phone]         Discharge Diagnoses:  1. CAP 2. Normocytic anemia 3. Paroxysmal atrial fibrillation 4. Temporal arteritis 5. Hypothyroidism  Discharge Condition: improved Disposition: home with HH PT, RN  Diet recommendation: regular  History of present illness:  78 year old woman admitted for pneumonia.  Hospital Course:  [name] was started on empiric antibiotics for CAP and rapidly improved. Comorbidities have remained stable. Oxygen-requirement has resolved. She will return home with 24-hour care. 1. CAP: Improving. Continue antibiotics. Consider outpatient chest CT to exclude central lesion.  2. Normocytic anemia: Probably dilutional. Likely at baseline. Check CBC in AM.    3. Paroxysmal atrial fibrillation: Warfarin per pharmacy. Resume Zebeta.    4. Temporal arteritis: Stable. Continue prednisone.  5. Hypothyroidism: Continue Synthroid.  Consultants:  none  Procedures:  none  Antibiotics:  Zithromax 8/6>>8/10   Rocephin 8/6>>8/8  Augmentin 8/9>>8/12  Discharge Instructions  Discharge Orders    Future Orders Please Complete By Expires   Diet general      Increase activity slowly      Discharge instructions      Comments:   Finish antibiotics. Call physician for fever, worsening shortness of breath.   Home Health      Questions: Responses:   To provide the following care/treatments PT    RN   Face-to-face encounter      Comments:   I  Brendia Sacks certify that this patient is under my care and that I, or a nurse practitioner or physician's assistant working with me, had a face-to-face encounter that meets the physician face-to-face encounter requirements with this patient on 12/30/2011.   Questions: Responses:   The encounter with the patient was in whole, or in part, for the following medical condition, which is the primary reason for home health care pneumonia   I certify that, based on my findings, the following services are medically necessary home health services Physical therapy   My clinical findings support the need for the above services High Risk for rehospitalization   Further, I certify that my clinical findings support that this patient is homebound (i.e. absences from home require considerable and taxing effort and are for medical reasons or religious services or infrequently or of short duration when for other reasons) Unable to leave home safely without assistance   To provide the following care/treatments PT    RN     Medication List  As of 12/30/2011  3:44 PM   TAKE these medications         amoxicillin-clavulanate 875-125 MG per tablet   Commonly known as: AUGMENTIN   Take 1 tablet by mouth every 12 (twelve) hours. Start 8/9.      azithromycin 500 MG tablet   Commonly known as: ZITHROMAX   Take 1 tablet (500 mg total) by mouth daily. Start 8/9.      bisoprolol 10 MG tablet   Commonly known as: ZEBETA   Take 10 mg by mouth 2 (two) times daily.      folic acid 1  MG tablet   Commonly known as: FOLVITE   Take 1 mg by mouth daily.      furosemide 20 MG tablet   Commonly known as: LASIX   Take 20 mg by mouth daily.      HYDROcodone-acetaminophen 5-325 MG per tablet   Commonly known as: NORCO/VICODIN   Take 0.5-1 tablets by mouth every 4 (four) hours as needed. As needed for pain, may increase to one tab if needed      levothyroxine 50 MCG tablet   Commonly known as: SYNTHROID, LEVOTHROID   Take  50 mcg by mouth daily.      methotrexate 2.5 MG tablet   Commonly known as: RHEUMATREX   Take 12.5 mg by mouth once a week. Caution:Chemotherapy. Protect from light.    Takes 5 tablets every Tuesday.  For Temporal Arthritis      potassium chloride SA 20 MEQ tablet   Commonly known as: K-DUR,KLOR-CON   Take 20 mEq by mouth daily.      predniSONE 5 MG tablet   Commonly known as: DELTASONE   Take 10 mg by mouth daily.      simvastatin 5 MG tablet   Commonly known as: ZOCOR   Take 5 mg by mouth at bedtime.      warfarin 2 MG tablet   Commonly known as: COUMADIN   Take 2-3 mg by mouth daily. Takes 1.5 tabs (3mg ) daily except on Wednesday, then takes 1 tab (2mg )           The results of significant diagnostics from this hospitalization (including imaging, microbiology, ancillary and laboratory) are listed below for reference.    Significant Diagnostic Studies: Dg Chest 2 View  12/28/2011  *RADIOLOGY REPORT*  Clinical Data: Vomiting and cough.  Chest pain.  Fever.  Chills.  CHEST - 2 VIEW  Comparison: 05/26/2011  Findings: The stable appearance of postoperative changes in the mediastinum and cardiac pacemaker.  Normal heart size and pulmonary vascularity.  Calcified and tortuous aorta.  Emphysematous changes and fibrosis in the lungs.  Persistent airspace consolidation in the right lung base suggesting pneumonia.  Appearance is similar to previous study.  Central obstructing lesion is not entirely excluded and follow-up with CT is recommended after resolution of acute symptoms.  Degenerative changes in the thoracic spine. Compression of T12, stable.  IMPRESSION: Emphysematous changes in the lungs.  Consolidation in the right lower lung suggesting pneumonia.  Follow-up with chest CT is recommended to exclude central obstructing lesion.  Original Report Authenticated By: Marlon Pel, M.D.   Microbiology: Recent Results (from the past 240 hour(s))  CULTURE, BLOOD (ROUTINE X 2)      Status: Normal (Preliminary result)   Collection Time   12/28/11  4:00 AM      Component Value Range Status Comment   Specimen Description BLOOD LEFT ANTECUBITAL   Final    Special Requests BOTTLES DRAWN AEROBIC AND ANAEROBIC 5CC   Final    Culture  Setup Time 12/28/2011 08:52   Final    Culture     Final    Value:        BLOOD CULTURE RECEIVED NO GROWTH TO DATE CULTURE WILL BE HELD FOR 5 DAYS BEFORE ISSUING A FINAL NEGATIVE REPORT   Report Status PENDING   Incomplete   CULTURE, BLOOD (ROUTINE X 2)     Status: Normal (Preliminary result)   Collection Time   12/28/11  4:00 AM      Component Value Range Status  Comment   Specimen Description BLOOD RIGHT ANTECUBITAL   Final    Special Requests BOTTLES DRAWN AEROBIC AND ANAEROBIC 4CC   Final    Culture  Setup Time 12/28/2011 08:52   Final    Culture     Final    Value:        BLOOD CULTURE RECEIVED NO GROWTH TO DATE CULTURE WILL BE HELD FOR 5 DAYS BEFORE ISSUING A FINAL NEGATIVE REPORT   Report Status PENDING   Incomplete     Labs: Basic Metabolic Panel:  Lab 12/29/11 6213 12/28/11 0330  NA 134* 135  K 4.1 4.4  CL 104 97  CO2 25 27  GLUCOSE 92 155*  BUN 24* 24*  CREATININE 0.72 0.76  CALCIUM 7.9* 9.0  MG -- --  PHOS -- --   Liver Function Tests:  Lab 12/29/11 0630 12/28/11 0330  AST 16 34  ALT 11 21  ALKPHOS 50 81  BILITOT 0.4 0.5  PROT 4.6* 6.6  ALBUMIN 2.3* 3.4*   CBC:  Lab 12/30/11 0417 12/29/11 0630 12/28/11 0330  WBC 8.7 11.1* 6.5  NEUTROABS -- 9.8* 5.3  HGB 10.8* 9.6* 14.6  HCT 32.1* 28.8* 42.3  MCV 95.5 95.7 94.6  PLT 179 166 264   Cardiac Enzymes:  Lab 12/28/11 0330  CKTOTAL --  CKMB --  CKMBINDEX --  TROPONINI <0.30   Principal Problem:  *PNA (pneumonia) Active Problems:  HYPOTHYROIDISM  Atrial fibrillation  TEMPORAL ARTERITIS  Normocytic anemia   Time coordinating discharge: 20 minutes  Signed:  Brendia Sacks, MD [facility] 12/30/2011, 3:44 PM

## 2012-01-03 LAB — CULTURE, BLOOD (ROUTINE X 2): Culture: NO GROWTH

## 2012-01-04 LAB — CULTURE, BLOOD (ROUTINE X 2)

## 2012-02-08 ENCOUNTER — Encounter: Payer: Self-pay | Admitting: Internal Medicine

## 2012-02-08 ENCOUNTER — Ambulatory Visit (INDEPENDENT_AMBULATORY_CARE_PROVIDER_SITE_OTHER): Payer: Medicare Other | Admitting: Internal Medicine

## 2012-02-08 VITALS — BP 108/64 | HR 83 | Ht 67.0 in | Wt 97.0 lb

## 2012-02-08 DIAGNOSIS — Z950 Presence of cardiac pacemaker: Secondary | ICD-10-CM | POA: Insufficient documentation

## 2012-02-08 DIAGNOSIS — I4891 Unspecified atrial fibrillation: Secondary | ICD-10-CM

## 2012-02-08 LAB — PACEMAKER DEVICE OBSERVATION
BATTERY VOLTAGE: 2.79 V
BRDY-0002RV: 70 {beats}/min
BRDY-0004RV: 120 {beats}/min
RV LEAD AMPLITUDE: 11.4 mv
RV LEAD THRESHOLD: 0.625 V

## 2012-02-08 NOTE — Assessment & Plan Note (Signed)
Her [employer] Jude dual-chamber pacemaker is working normally. She has approximately 9 years of battery longevity. We'll plan to recheck in several months. 

## 2012-02-08 NOTE — Patient Instructions (Addendum)
Your physician wants you to follow-up in: 12 months with Dr. Taylor. You will receive a reminder letter in the mail two months in advance. If you don't receive a letter, please call our office to schedule the follow-up appointment.    
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
## 2012-02-08 NOTE — Assessment & Plan Note (Signed)
Her ventricular rate is well controlled. She will continue her current medical therapy. 

## 2012-02-22 ENCOUNTER — Other Ambulatory Visit: Payer: Self-pay | Admitting: Internal Medicine

## 2012-02-22 DIAGNOSIS — R918 Other nonspecific abnormal finding of lung field: Secondary | ICD-10-CM

## 2012-02-29 ENCOUNTER — Ambulatory Visit
Admission: RE | Admit: 2012-02-29 | Discharge: 2012-02-29 | Disposition: A | Discharge status: Home / Self Care | Payer: Medicare Other | Source: Ambulatory Visit | Attending: Internal Medicine | Admitting: Internal Medicine

## 2012-02-29 DIAGNOSIS — R918 Other nonspecific abnormal finding of lung field: Secondary | ICD-10-CM

## 2012-02-29 MED ORDER — IOHEXOL 300 MG/ML  SOLN
75.0000 mL | Freq: Once | INTRAMUSCULAR | Status: AC | PRN
  Administered 2012-02-29: 75 mL via INTRAVENOUS

## 2012-03-03 DIAGNOSIS — I495 Sick sinus syndrome: Secondary | ICD-10-CM

## 2012-04-19 ENCOUNTER — Inpatient Hospital Stay (HOSPITAL_COMMUNITY)
Admission: EM | Admit: 2012-04-19 | Discharge: 2012-04-21 | DRG: 177 | Disposition: A | Discharge status: Home / Self Care | Payer: Medicare Other | Attending: Internal Medicine | Admitting: Internal Medicine

## 2012-04-19 ENCOUNTER — Encounter (HOSPITAL_COMMUNITY): Payer: Self-pay | Admitting: Emergency Medicine

## 2012-04-19 ENCOUNTER — Emergency Department (HOSPITAL_COMMUNITY): Payer: Medicare Other

## 2012-04-19 DIAGNOSIS — J189 Pneumonia, unspecified organism: Secondary | ICD-10-CM | POA: Diagnosis present

## 2012-04-19 DIAGNOSIS — Z7901 Long term (current) use of anticoagulants: Secondary | ICD-10-CM

## 2012-04-19 DIAGNOSIS — I4891 Unspecified atrial fibrillation: Secondary | ICD-10-CM | POA: Diagnosis present

## 2012-04-19 DIAGNOSIS — D649 Anemia, unspecified: Secondary | ICD-10-CM

## 2012-04-19 DIAGNOSIS — J690 Pneumonitis due to inhalation of food and vomit: Principal | ICD-10-CM | POA: Diagnosis present

## 2012-04-19 DIAGNOSIS — I509 Heart failure, unspecified: Secondary | ICD-10-CM

## 2012-04-19 DIAGNOSIS — K219 Gastro-esophageal reflux disease without esophagitis: Secondary | ICD-10-CM

## 2012-04-19 DIAGNOSIS — E860 Dehydration: Secondary | ICD-10-CM | POA: Diagnosis present

## 2012-04-19 DIAGNOSIS — J9691 Respiratory failure, unspecified with hypoxia: Secondary | ICD-10-CM

## 2012-04-19 DIAGNOSIS — Z950 Presence of cardiac pacemaker: Secondary | ICD-10-CM

## 2012-04-19 DIAGNOSIS — I959 Hypotension, unspecified: Secondary | ICD-10-CM

## 2012-04-19 DIAGNOSIS — M316 Other giant cell arteritis: Secondary | ICD-10-CM | POA: Diagnosis present

## 2012-04-19 DIAGNOSIS — E43 Unspecified severe protein-calorie malnutrition: Secondary | ICD-10-CM | POA: Diagnosis present

## 2012-04-19 DIAGNOSIS — E039 Hypothyroidism, unspecified: Secondary | ICD-10-CM | POA: Diagnosis present

## 2012-04-19 DIAGNOSIS — Z9889 Other specified postprocedural states: Secondary | ICD-10-CM

## 2012-04-19 DIAGNOSIS — Z681 Body mass index (BMI) 19 or less, adult: Secondary | ICD-10-CM

## 2012-04-19 DIAGNOSIS — Z993 Dependence on wheelchair: Secondary | ICD-10-CM

## 2012-04-19 DIAGNOSIS — R0902 Hypoxemia: Secondary | ICD-10-CM

## 2012-04-19 DIAGNOSIS — J9600 Acute respiratory failure, unspecified whether with hypoxia or hypercapnia: Secondary | ICD-10-CM | POA: Diagnosis present

## 2012-04-19 LAB — CBC
HCT: 40.5 % (ref 36.0–46.0)
Hemoglobin: 13.7 g/dL (ref 12.0–15.0)
MCH: 31.6 pg (ref 26.0–34.0)
MCV: 93.5 fL (ref 78.0–100.0)
RBC: 4.33 MIL/uL (ref 3.87–5.11)
WBC: 6.8 10*3/uL (ref 4.0–10.5)

## 2012-04-19 LAB — POCT I-STAT, CHEM 8
Creatinine, Ser: 1.1 mg/dL (ref 0.50–1.10)
Glucose, Bld: 127 mg/dL — ABNORMAL HIGH (ref 70–99)
HCT: 40 % (ref 36.0–46.0)
Hemoglobin: 13.6 g/dL (ref 12.0–15.0)
Potassium: 4 mEq/L (ref 3.5–5.1)
Sodium: 137 mEq/L (ref 135–145)
TCO2: 23 mmol/L (ref 0–100)

## 2012-04-19 LAB — COMPREHENSIVE METABOLIC PANEL
AST: 32 U/L (ref 0–37)
CO2: 25 mEq/L (ref 19–32)
Calcium: 9.1 mg/dL (ref 8.4–10.5)
Chloride: 101 mEq/L (ref 96–112)
Creatinine, Ser: 0.89 mg/dL (ref 0.50–1.10)
GFR calc Af Amer: 65 mL/min — ABNORMAL LOW (ref >90)
GFR calc non Af Amer: 56 mL/min — ABNORMAL LOW (ref >90)
Glucose, Bld: 127 mg/dL — ABNORMAL HIGH (ref 70–99)
Total Bilirubin: 0.4 mg/dL (ref 0.3–1.2)

## 2012-04-19 LAB — PROTIME-INR
INR: 2.25 — ABNORMAL HIGH (ref 0.00–1.49)
Prothrombin Time: 23.9 seconds — ABNORMAL HIGH (ref 11.6–15.2)

## 2012-04-19 MED ORDER — ACETAMINOPHEN 650 MG RE SUPP: 650.0000 mg | Freq: Four times a day (QID) | RECTAL | Status: DC | PRN

## 2012-04-19 MED ORDER — PANTOPRAZOLE SODIUM 40 MG PO TBEC
40.0000 mg | DELAYED_RELEASE_TABLET | Freq: Every day | ORAL | Status: DC
  Administered 2012-04-19 – 2012-04-21 (×3): 40 mg via ORAL
  Filled 2012-04-19 (×3): qty 1

## 2012-04-19 MED ORDER — ALBUTEROL SULFATE (5 MG/ML) 0.5% IN NEBU
2.5000 mg | INHALATION_SOLUTION | Freq: Four times a day (QID) | RESPIRATORY_TRACT | Status: DC
  Administered 2012-04-19 – 2012-04-21 (×7): 2.5 mg via RESPIRATORY_TRACT
  Filled 2012-04-19 (×8): qty 0.5

## 2012-04-19 MED ORDER — WARFARIN SODIUM 3 MG PO TABS
3.0000 mg | ORAL_TABLET | Freq: Once | ORAL | Status: AC
  Administered 2012-04-19: 3 mg via ORAL
  Filled 2012-04-19: qty 1

## 2012-04-19 MED ORDER — IPRATROPIUM BROMIDE 0.02 % IN SOLN
0.5000 mg | RESPIRATORY_TRACT | Status: DC | PRN
  Filled 2012-04-19: qty 2.5

## 2012-04-19 MED ORDER — SODIUM CHLORIDE 0.9 % IJ SOLN
3.0000 mL | Freq: Two times a day (BID) | INTRAMUSCULAR | Status: DC
  Administered 2012-04-20 – 2012-04-21 (×2): 3 mL via INTRAVENOUS

## 2012-04-19 MED ORDER — SODIUM CHLORIDE 0.9 % IJ SOLN: 3.0000 mL | Freq: Two times a day (BID) | INTRAMUSCULAR | Status: DC

## 2012-04-19 MED ORDER — OXYCODONE HCL 5 MG PO TABS: 5.0000 mg | ORAL_TABLET | ORAL | Status: DC | PRN

## 2012-04-19 MED ORDER — LEVOFLOXACIN IN D5W 750 MG/150ML IV SOLN: 750.0000 mg | INTRAVENOUS | Status: DC

## 2012-04-19 MED ORDER — BENZONATATE 100 MG PO CAPS
100.0000 mg | ORAL_CAPSULE | Freq: Three times a day (TID) | ORAL | Status: DC
  Administered 2012-04-19 – 2012-04-21 (×7): 100 mg via ORAL
  Filled 2012-04-19 (×9): qty 1

## 2012-04-19 MED ORDER — CALCIUM CARBONATE-VITAMIN D 500-200 MG-UNIT PO TABS
1.0000 | ORAL_TABLET | Freq: Every day | ORAL | Status: DC
  Administered 2012-04-19 – 2012-04-21 (×3): 1 via ORAL
  Filled 2012-04-19 (×3): qty 1

## 2012-04-19 MED ORDER — ONDANSETRON HCL 4 MG/2ML IJ SOLN
4.0000 mg | Freq: Once | INTRAMUSCULAR | Status: AC
  Administered 2012-04-19: 4 mg via INTRAVENOUS
  Filled 2012-04-19: qty 2

## 2012-04-19 MED ORDER — HEPARIN SODIUM (PORCINE) 5000 UNIT/ML IJ SOLN
5000.0000 [IU] | Freq: Three times a day (TID) | INTRAMUSCULAR | Status: DC
  Filled 2012-04-19 (×3): qty 1

## 2012-04-19 MED ORDER — LEVOFLOXACIN IN D5W 750 MG/150ML IV SOLN
750.0000 mg | INTRAVENOUS | Status: DC
  Administered 2012-04-21: 750 mg via INTRAVENOUS
  Filled 2012-04-19: qty 150

## 2012-04-19 MED ORDER — SODIUM CHLORIDE 0.9 % IV SOLN
INTRAVENOUS | Status: DC
  Administered 2012-04-19: 06:00:00 via INTRAVENOUS

## 2012-04-19 MED ORDER — IPRATROPIUM BROMIDE 0.02 % IN SOLN
0.5000 mg | Freq: Four times a day (QID) | RESPIRATORY_TRACT | Status: DC
  Administered 2012-04-19 – 2012-04-21 (×7): 0.5 mg via RESPIRATORY_TRACT
  Filled 2012-04-19 (×8): qty 2.5

## 2012-04-19 MED ORDER — ONDANSETRON HCL 4 MG PO TABS: 4.0000 mg | ORAL_TABLET | Freq: Four times a day (QID) | ORAL | Status: DC | PRN

## 2012-04-19 MED ORDER — PIPERACILLIN-TAZOBACTAM 3.375 G IVPB
3.3750 g | Freq: Once | INTRAVENOUS | Status: AC
  Administered 2012-04-19: 3.375 g via INTRAVENOUS
  Filled 2012-04-19: qty 50

## 2012-04-19 MED ORDER — BISOPROLOL FUMARATE 10 MG PO TABS
10.0000 mg | ORAL_TABLET | Freq: Two times a day (BID) | ORAL | Status: DC
  Administered 2012-04-19 – 2012-04-21 (×5): 10 mg via ORAL
  Filled 2012-04-19 (×6): qty 1

## 2012-04-19 MED ORDER — SODIUM CHLORIDE 0.9 % IV SOLN: 250.0000 mL | INTRAVENOUS | Status: DC | PRN

## 2012-04-19 MED ORDER — HYDROCORTISONE SOD SUCCINATE 100 MG IJ SOLR
50.0000 mg | Freq: Two times a day (BID) | INTRAMUSCULAR | Status: DC
  Administered 2012-04-19 – 2012-04-21 (×5): 50 mg via INTRAVENOUS
  Filled 2012-04-19 (×6): qty 1

## 2012-04-19 MED ORDER — SODIUM CHLORIDE 0.9 % IJ SOLN: 3.0000 mL | INTRAMUSCULAR | Status: DC | PRN

## 2012-04-19 MED ORDER — ACETAMINOPHEN 325 MG PO TABS
650.0000 mg | ORAL_TABLET | Freq: Once | ORAL | Status: AC
  Administered 2012-04-19: 650 mg via ORAL
  Filled 2012-04-19: qty 2

## 2012-04-19 MED ORDER — FOLIC ACID 1 MG PO TABS
1.0000 mg | ORAL_TABLET | Freq: Every day | ORAL | Status: DC
  Administered 2012-04-19 – 2012-04-21 (×3): 1 mg via ORAL
  Filled 2012-04-19 (×3): qty 1

## 2012-04-19 MED ORDER — LEVOFLOXACIN IN D5W 750 MG/150ML IV SOLN
750.0000 mg | Freq: Once | INTRAVENOUS | Status: AC
Start: 2012-04-19 — End: 2012-04-19
  Administered 2012-04-19: 750 mg via INTRAVENOUS
  Filled 2012-04-19: qty 150

## 2012-04-19 MED ORDER — GUAIFENESIN ER 600 MG PO TB12
600.0000 mg | ORAL_TABLET | Freq: Two times a day (BID) | ORAL | Status: DC
  Administered 2012-04-19 – 2012-04-21 (×5): 600 mg via ORAL
  Filled 2012-04-19 (×6): qty 1

## 2012-04-19 MED ORDER — PIPERACILLIN-TAZOBACTAM 3.375 G IVPB
3.3750 g | Freq: Three times a day (TID) | INTRAVENOUS | Status: DC
  Administered 2012-04-19 – 2012-04-21 (×6): 3.375 g via INTRAVENOUS
  Filled 2012-04-19 (×8): qty 50

## 2012-04-19 MED ORDER — LEVOTHYROXINE SODIUM 50 MCG PO TABS
50.0000 ug | ORAL_TABLET | Freq: Every day | ORAL | Status: DC
  Administered 2012-04-19 – 2012-04-21 (×3): 50 ug via ORAL
  Filled 2012-04-19 (×4): qty 1

## 2012-04-19 MED ORDER — SODIUM CHLORIDE 0.9 % IV SOLN
INTRAVENOUS | Status: DC
  Administered 2012-04-19: 07:00:00 via INTRAVENOUS

## 2012-04-19 MED ORDER — OCUVITE PO TABS
1.0000 | ORAL_TABLET | Freq: Every day | ORAL | Status: DC
  Administered 2012-04-19 – 2012-04-21 (×3): 1 via ORAL
  Filled 2012-04-19 (×3): qty 1

## 2012-04-19 MED ORDER — SIMVASTATIN 5 MG PO TABS
5.0000 mg | ORAL_TABLET | Freq: Every day | ORAL | Status: DC
  Administered 2012-04-19 – 2012-04-20 (×2): 5 mg via ORAL
  Filled 2012-04-19 (×3): qty 1

## 2012-04-19 MED ORDER — WARFARIN - PHARMACIST DOSING INPATIENT: Freq: Every day | Status: DC

## 2012-04-19 MED ORDER — MORPHINE SULFATE 2 MG/ML IJ SOLN: 2.0000 mg | INTRAMUSCULAR | Status: DC | PRN

## 2012-04-19 MED ORDER — ALBUTEROL SULFATE (5 MG/ML) 0.5% IN NEBU
2.5000 mg | INHALATION_SOLUTION | RESPIRATORY_TRACT | Status: DC | PRN
  Filled 2012-04-19: qty 0.5

## 2012-04-19 MED ORDER — ACETAMINOPHEN 325 MG PO TABS: 650.0000 mg | ORAL_TABLET | Freq: Four times a day (QID) | ORAL | Status: DC | PRN

## 2012-04-19 MED ORDER — ONDANSETRON HCL 4 MG/2ML IJ SOLN: 4.0000 mg | Freq: Four times a day (QID) | INTRAMUSCULAR | Status: DC | PRN

## 2012-04-19 NOTE — Progress Notes (Signed)
PT Cancellation Note  Patient Details Name: DARIANNE BLISS MRN: [id number] DOB: 1921/11/27   Cancelled Treatment:    Reason Eval/Treat Not Completed: Other (comment) (Pt declined OOB due to fatigue. )  Will check back as schedule permits.    Thanks,    Page, Meribeth Mattes 04/19/2012, 3:51 PM

## 2012-04-19 NOTE — Progress Notes (Signed)
ANTIBIOTIC CONSULT NOTE - INITIAL  Pharmacy Consult for Zosyn Indication: PNA  No Known Allergies  Patient Measurements: Height: 5\' 7"  (170.2 cm) Weight: 101 lb 6.4 oz (45.995 kg) IBW/kg (Calculated) : 61.6   Vital Signs: Temp: 98.7 F (37.1 C) (11/27 0819) Temp src: Oral (11/27 0819) BP: 106/47 mmHg (11/27 0819) Pulse Rate: 82  (11/27 0819) Intake/Output from previous day: 11/26 0701 - 11/27 0700 In: 1000 [I.V.:1000] Out: -  Intake/Output from this shift:    Labs:  Basename 04/19/12 0536 04/19/12 0525  WBC -- 6.8  HGB 13.6 13.7  PLT -- 220  LABCREA -- --  CREATININE 1.10 0.89   Estimated Creatinine Clearance: 25.7 ml/min (by C-G formula based on Cr of 1.1). No results found for this basename: VANCOTROUGH:2,VANCOPEAK:2,VANCORANDOM:2,GENTTROUGH:2,GENTPEAK:2,GENTRANDOM:2,TOBRATROUGH:2,TOBRAPEAK:2,TOBRARND:2,AMIKACINPEAK:2,AMIKACINTROU:2,AMIKACIN:2, in the last 72 hours   Microbiology: No results found for this or any previous visit (from the past 720 hour(s)).  Medical History: Past Medical History  Diagnosis Date  . Hypokalemia   . GERD (gastroesophageal reflux disease)   . Temporal arteritis   . Atrial fibrillation   . Hypothyroidism   . Hyponatremia   . Angina   . Shortness of breath   . Hip fracture   . Pacemaker     Medications:  Anti-infectives     Start     Dose/Rate Route Frequency Ordered Stop   04/20/12 0600   levofloxacin (LEVAQUIN) IVPB 750 mg        750 mg 100 mL/hr over 90 Minutes Intravenous Every 24 hours 04/19/12 0847     04/19/12 1000   piperacillin-tazobactam (ZOSYN) IVPB 3.375 g        3.375 g 12.5 mL/hr over 240 Minutes Intravenous  Once 04/19/12 0904     04/19/12 0515   levofloxacin (LEVAQUIN) IVPB 750 mg        750 mg 100 mL/hr over 90 Minutes Intravenous  Once 04/19/12 0508 04/19/12 4098         Assessment: [name] admitted 11/27 with CAP vs asp PNA. Initially started on Levaquin (per MD), now to add on Zosyn. Pt with  CrCl~ 25 - will need to monitor closely.  Plan:  1) Zosyn 3.375g IV q8h (EI) 2) Suggest changing Levaquin to q48h for CrCl<50 ml/min.  Janice [name], PharmD Pager: [phone] 04/19/2012,9:53 AM

## 2012-04-19 NOTE — ED Notes (Signed)
Dr Brien Few at bedside

## 2012-04-19 NOTE — ED Notes (Signed)
Dr Dierdre Highman aware of BP Diastolic of 37.

## 2012-04-19 NOTE — ED Notes (Signed)
Bed:WA14<BR> Expected date:<BR> Expected time:<BR> Means of arrival:<BR> Comments:<BR> EMS

## 2012-04-19 NOTE — ED Notes (Signed)
Pt alert, arrives from home, c/o n/v, onset was this evening, hx URI, pneumonia, c/o left flank pain, onset was yesterday, initial O2 80's on scene, O2 applied, O2 increased to 91% [place] @ 3L, resp even, unlabored, NPC noted, skin pwd,
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
## 2012-04-19 NOTE — ED Notes (Signed)
Report given to tara, rn on floor.   Report given blood cultures have not been ordered. 1 antibiotic administered
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
## 2012-04-19 NOTE — ED Notes (Signed)
Pt alert and oriented x4. Respirations even and slightly labored, pt on oxygen 97% 2L [place], bilateral symmetrical rise and fall of chest. Skin warm and dry. In no acute distress. Denies needs.

## 2012-04-19 NOTE — ED Notes (Signed)
Pt requesting something for her cough, rn will pass this on to floor rn.
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
## 2012-04-19 NOTE — Progress Notes (Signed)
ANTICOAGULATION CONSULT NOTE - Initial Consult  Pharmacy Consult for Warfarin Indication: AFib  No Known Allergies  Patient Measurements: Height: 5\' 7"  (170.2 cm) Weight: 101 lb 6.4 oz (45.995 kg) IBW/kg (Calculated) : 61.6   Vital Signs: Temp: 98.7 F (37.1 C) (11/27 0819) Temp src: Oral (11/27 0819) BP: 106/47 mmHg (11/27 0819) Pulse Rate: 82  (11/27 0819)  Labs:  Basename 04/19/12 0550 04/19/12 0536 04/19/12 0525  HGB -- 13.6 13.7  HCT -- 40.0 40.5  PLT -- -- 220  APTT -- -- --  LABPROT 23.9* -- --  INR 2.25* -- --  HEPARINUNFRC -- -- --  CREATININE -- 1.10 0.89  CKTOTAL -- -- --  CKMB -- -- --  TROPONINI -- -- <0.30    Estimated Creatinine Clearance: 25.7 ml/min (by C-G formula based on Cr of 1.1).   Medical History: Past Medical History  Diagnosis Date  . Hypokalemia   . GERD (gastroesophageal reflux disease)   . Temporal arteritis   . Atrial fibrillation   . Hypothyroidism   . Hyponatremia   . Angina   . Shortness of breath   . Hip fracture   . Pacemaker     Medications:  Scheduled:    . [COMPLETED] acetaminophen  650 mg Oral Once  . ipratropium  0.5 mg Nebulization Q6H   And  . albuterol  2.5 mg Nebulization Q6H  . benzonatate  100 mg Oral TID  . beta carotene w/minerals  1 tablet Oral Daily  . bisoprolol  10 mg Oral BID  . calcium-vitamin D  1 tablet Oral Daily  . folic acid  1 mg Oral Daily  . guaiFENesin  600 mg Oral BID  . heparin  5,000 Units Subcutaneous Q8H  . hydrocortisone sod succinate (SOLU-CORTEF) injection  50 mg Intravenous Q12H  . [COMPLETED] levofloxacin (LEVAQUIN) IV  750 mg Intravenous Once  . levofloxacin (LEVAQUIN) IV  750 mg Intravenous Q24H  . levothyroxine  50 mcg Oral QAC breakfast  . [COMPLETED] ondansetron  4 mg Intravenous Once  . pantoprazole  40 mg Oral Daily  . piperacillin-tazobactam (ZOSYN)  IV  3.375 g Intravenous Once  . piperacillin-tazobactam (ZOSYN)  IV  3.375 g Intravenous Q8H  . simvastatin  5 mg  Oral QHS  . sodium chloride  3 mL Intravenous Q12H  . sodium chloride  3 mL Intravenous Q12H  . [DISCONTINUED] sodium chloride   Intravenous STAT   Infusions:    . [DISCONTINUED] sodium chloride Stopped (04/19/12 0759)    Assessment: [name] admitted 11/27 with CAP vs asp PNA. On chronic warfarin for hx of AFib. Home dose reported as 3mg  daily, last taken 11/26. INR this am is therapeutic (2.25). Drug interaction with Levaquin noted (possible increase in INR) - will monitor.  Goal of Therapy:  INR 2-3   Plan:  1) Warfarin 3mg  PO x1 at 18:00 2) Daily PT/INR  Darrol Angel, PharmD Pager: [phone] 04/19/2012,9:59 AM

## 2012-04-19 NOTE — Plan of Care (Signed)
Problem: Phase I Progression Outcomes Goal: Voiding-avoid urinary catheter unless indicated Outcome: Completed/Met Date Met:  04/19/12 Order for catheter, but RN spoke with MD because pt and RN can get her up to [facility]

## 2012-04-20 DIAGNOSIS — I509 Heart failure, unspecified: Secondary | ICD-10-CM

## 2012-04-20 DIAGNOSIS — K219 Gastro-esophageal reflux disease without esophagitis: Secondary | ICD-10-CM

## 2012-04-20 LAB — PROTIME-INR
INR: 3.14 — ABNORMAL HIGH (ref 0.00–1.49)
Prothrombin Time: 30.6 seconds — ABNORMAL HIGH (ref 11.6–15.2)

## 2012-04-20 LAB — CBC
MCHC: 34.4 g/dL (ref 30.0–36.0)
RDW: 15.2 % (ref 11.5–15.5)

## 2012-04-20 LAB — COMPREHENSIVE METABOLIC PANEL
ALT: 13 U/L (ref 0–35)
Alkaline Phosphatase: 46 U/L (ref 39–117)
CO2: 24 mEq/L (ref 19–32)
GFR calc Af Amer: 61 mL/min — ABNORMAL LOW (ref >90)
GFR calc non Af Amer: 53 mL/min — ABNORMAL LOW (ref >90)
Glucose, Bld: 141 mg/dL — ABNORMAL HIGH (ref 70–99)
Potassium: 3.9 mEq/L (ref 3.5–5.1)
Sodium: 133 mEq/L — ABNORMAL LOW (ref 135–145)
Total Bilirubin: 0.6 mg/dL (ref 0.3–1.2)
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
## 2012-04-20 NOTE — Progress Notes (Signed)
ANTICOAGULATION CONSULT NOTE - Follow Up  Pharmacy Consult for Warfarin Indication: AFib  No Known Allergies  Patient Measurements: Height: 5\' 7"  (170.2 cm) Weight: 101 lb 6.4 oz (45.995 kg) IBW/kg (Calculated) : 61.6   Vital Signs: Temp: 97.9 F (36.6 C) (11/28 0649) Temp src: Oral (11/28 0649) BP: 108/48 mmHg (11/28 0649) Pulse Rate: 70  (11/28 0649)  Labs:  Basename 04/20/12 0448 04/19/12 0550 04/19/12 0536 04/19/12 0525  HGB 10.0* -- 13.6 --  HCT 29.1* -- 40.0 40.5  PLT 139* -- -- 220  APTT -- -- -- --  LABPROT 30.6* 23.9* -- --  INR 3.14* 2.25* -- --  HEPARINUNFRC -- -- -- --  CREATININE 0.94 -- 1.10 0.89  CKTOTAL -- -- -- --  CKMB -- -- -- --  TROPONINI -- -- -- <0.30    Estimated Creatinine Clearance: 30 ml/min (by C-G formula based on Cr of 0.94).   Medical History: Past Medical History  Diagnosis Date  . Hypokalemia   . GERD (gastroesophageal reflux disease)   . Temporal arteritis   . Atrial fibrillation   . Hypothyroidism   . Hyponatremia   . Angina   . Shortness of breath   . Hip fracture   . Pacemaker     Medications:  Scheduled:     . ipratropium  0.5 mg Nebulization Q6H   And  . albuterol  2.5 mg Nebulization Q6H  . benzonatate  100 mg Oral TID  . beta carotene w/minerals  1 tablet Oral Daily  . bisoprolol  10 mg Oral BID  . calcium-vitamin D  1 tablet Oral Daily  . folic acid  1 mg Oral Daily  . guaiFENesin  600 mg Oral BID  . hydrocortisone sod succinate (SOLU-CORTEF) injection  50 mg Intravenous Q12H  . levofloxacin (LEVAQUIN) IV  750 mg Intravenous Q48H  . levothyroxine  50 mcg Oral QAC breakfast  . pantoprazole  40 mg Oral Daily  . [COMPLETED] piperacillin-tazobactam (ZOSYN)  IV  3.375 g Intravenous Once  . piperacillin-tazobactam (ZOSYN)  IV  3.375 g Intravenous Q8H  . simvastatin  5 mg Oral QHS  . sodium chloride  3 mL Intravenous Q12H  . sodium chloride  3 mL Intravenous Q12H  . [COMPLETED] warfarin  3 mg Oral ONCE-1800    . Warfarin - Pharmacist Dosing Inpatient   Does not apply q1800   Infusions:    Assessment: [name] admitted 11/27 with CAP vs asp PNA. On chronic warfarin for hx of AFib. Home dose reported as 3mg  daily, last taken 11/26. INR therapeutic on admission (2.25) but jumped up this am to 3.14, possibly due to drug interaction with Levaquin vs change in dietary habits in hospital? No bleeding reported in chart notes.  Goal of Therapy:  INR 2-3   Plan:  1) No warfarin tonight - plan to resume dosing once INR <3 2) Daily PT/INR  Darrol Angel, PharmD Pager: [phone] 04/20/2012,10:22 AM

## 2012-04-21 DIAGNOSIS — M316 Other giant cell arteritis: Secondary | ICD-10-CM

## 2012-04-21 LAB — CBC
Hemoglobin: 9.4 g/dL — ABNORMAL LOW (ref 12.0–15.0)
MCH: 32.1 pg (ref 26.0–34.0)
MCHC: 34.3 g/dL (ref 30.0–36.0)

## 2012-04-21 LAB — BASIC METABOLIC PANEL
BUN: 18 mg/dL (ref 6–23)
GFR calc non Af Amer: 64 mL/min — ABNORMAL LOW (ref >90)
Glucose, Bld: 154 mg/dL — ABNORMAL HIGH (ref 70–99)
Potassium: 3.2 mEq/L — ABNORMAL LOW (ref 3.5–5.1)

## 2012-04-21 MED ORDER — LEVOFLOXACIN 500 MG PO TABS: 500.0000 mg | ORAL_TABLET | Freq: Every day | ORAL | Status: AC

## 2012-04-21 MED ORDER — GUAIFENESIN ER 600 MG PO TB12: 600.0000 mg | ORAL_TABLET | Freq: Two times a day (BID) | ORAL | Status: DC

## 2012-04-21 MED ORDER — POTASSIUM CHLORIDE CRYS ER 20 MEQ PO TBCR
40.0000 meq | EXTENDED_RELEASE_TABLET | Freq: Once | ORAL | Status: AC
  Administered 2012-04-21: 40 meq via ORAL
  Filled 2012-04-21: qty 2

## 2012-04-21 MED ORDER — WARFARIN SODIUM 2 MG PO TABS
2.0000 mg | ORAL_TABLET | Freq: Once | ORAL | Status: DC
  Filled 2012-04-21: qty 1

## 2012-04-21 MED ORDER — PREDNISONE 10 MG PO TABS: ORAL_TABLET | ORAL | Status: DC

## 2012-04-21 MED ORDER — BENZONATATE 100 MG PO CAPS: 100.0000 mg | ORAL_CAPSULE | Freq: Three times a day (TID) | ORAL | Status: DC

## 2012-04-21 NOTE — Progress Notes (Signed)
SLP Cancellation Note  Patient Details Name: DAYONA SHAHEEN MRN: [id number] DOB: [birth year]   Cancelled treatment:        Pt being discharged, in process of going through DC paperwork with RN.  Please reorder if indicated.  Donavan Burnet, MS [facility] SLP [phone]

## 2012-04-21 NOTE — Discharge Summary (Signed)
Physician Discharge Summary  Janice [name] WUJ:[id number] DOB: 06/20/1921 DOA: 04/19/2012  PCP: Thayer Headings, MD  Admit date: 04/19/2012 Discharge date: 04/21/2012  Time spent: 40 minutes  Recommendations for Outpatient Follow-up:  1. Follow up with primary MD.   Discharge Diagnoses:  Active Problems:  CAP (community acquired pneumonia)  Hypothyroidism  Temporal arteritis  Dehydration  Protein-calorie malnutrition, severe   Discharge Condition: Satisfactory.   Diet recommendation:  Heart-Healthy.   Filed Weights   04/19/12 0819  Weight: 45.995 kg (101 lb 6.4 oz)    History of present illness:  This is an 78 year old female, with known history of temporal arteritis, atrial fibrillation on anticoagulation, s/p PPM, hypothyroidism, CAD, s/p previous right hip fracture about 15 years ago, s/p ORIF. Refracture noted 05/2011, but not amenable to surgery. Now has difficulty ambulating, and is wheelchair bound, s/p TAH, s/p Cholecystectomy, s/p cataract surgery, LE varicosities, s/p venous stripping. According to history provided by patient,and her care-giver, a CNA, who was present at the bed side, she had what appeared to be "hiccups", since 04/17/12. On 04/18/12, she developed right-sided pain in the lower chest, posteriorly. She also experienced chills, but no fever. At about 2:30 AM on 04/19/12, patient started vomiting, and this was followed by coughing and marked shortness of breath. She had no fever or diarrhea. EMS was called. EMS reports room air pulse oximeter in the mid 80s improved with 3 L oxygen nasal cannula. Patient was admitted for further management.    Hospital Course:  1. CAP (community acquired pneumonia): Patient presented with an episode of vomiting, followed by cough and SOB, associated with chills, but no pyrexia. Wcc is normal, and oxygen saturation was in the 80s, at the time of EMS evaluation. CXR confirms right-sided pneumonia. Clinically, this appeared  consistent with an aspiration pneumonia, although CAP cannot be excluded completely. Patient has had 2 previous admissions for pneumonia, i.e, in 05/2011 and 10/2011. She was managed with iv Zosyn and/Levaquin, oxygen supplementation, mucolytics and bronchodilators, with satisfactory clinical response. As of 04/21/12, she felt considerably better had remained apyrexial, wcc was normal, and she was keen to go home. SLP evaluation revealed no evidence of overt aspiration. Patient has been transitioned to oral Levaquin, for a further 7 days of antibiotic therapy, to be concluded on 05/30/11.  2. Hypoxic respiratory failure: EMS reported that room air pulse oximeter was in the mid 80s on arrival at patient's home, and this improved with 3 L, [place]. She was noted to have a saturation of 76% in the ED. This is acute hypoxic respiratory failure, secondary to #1 above, and was managed as described above. As of 04/21/12, she was saturating at 99%-100% on room air.  3. Hypothyroidism: Managed with thyroxine replacement therapy. TSH was 1.452.  4. Temporal arteritis: This did not appear to be problematic, but as is on chronic steroid therapy. We placed her on stress doses of steroids, during her hospitalization, to cover the acute illness.  5. Dehydration: At presentation, BUN was 27, creatinine 1.10, consistent with mild dehydration, possibly due to vomiting, and Lasix therapy. Lasix was discontinued, and she was managed with gentle iv fluids. As of 04/20/12, hydration status was satisfactory.  6. Atrial fibrillation: Patient has a history of A.Fib, s/p PPM. Currently rate-controlled. She was monitored telemetrically and remained stable. Anticoagulation was managed by clinical pharmacologist.  7. Severe protein-calorie malnutrition: Patient looks cachectic, although she is said to have good appetite. Nutritionist consultation was requested, and recommendations implemented.  Procedures:  See below.    Consultations: N/A  Discharge Exam: Filed Vitals:   04/20/12 2155 04/21/12 0146 04/21/12 0500 04/21/12 0821  BP:   135/62   Pulse:   81   Temp:   97.6 F (36.4 C)   TempSrc:   Oral   Resp:   18   Height:      Weight:      SpO2: 100% 100% 99% 99%    General: Comfortable. Alert, communicative, fully oriented, talking in complete sentences, not short of breath at rest. She looks cachectic.  HEENT: No clinical pallor, no jaundice, no conjunctival injection or discharge. Hydration is satisfactory.  NECK: Supple, JVP not seen, no carotid bruits, no palpable lymphadenopathy, no palpable goiter.  CHEST: Pacer pouch is noted in right subclavian location. Clinically clear to auscultation, no wheezes, no crackles.  HEART: Sounds 1 and 2 heard, normal, regular, no murmurs.  ABDOMEN: Full, soft, non-tender, no palpable organomegaly, no palpable masses, normal bowel sounds.  GENITALIA: Not examined.  LOWER EXTREMITIES: No pitting edema, palpable peripheral pulses. Prominent varicosities, and stasis eczema.  MUSCULOSKELETAL SYSTEM: Generalized osteoarthritic changes.  CENTRAL NERVOUS SYSTEM: No focal neurologic deficit on gross examination.  Discharge Instructions      Discharge Orders    Future Orders Please Complete By Expires   Diet - low sodium heart healthy      Increase activity slowly          Medication List     As of 04/21/2012  1:49 PM    STOP taking these medications         furosemide 20 MG tablet   Commonly known as: LASIX      potassium chloride SA 20 MEQ tablet   Commonly known as: K-DUR,KLOR-CON      TAKE these medications         benzonatate 100 MG capsule   Commonly known as: TESSALON   Take 1 capsule (100 mg total) by mouth 3 (three) times daily.      beta carotene w/minerals tablet   Take by mouth daily.      bisoprolol 10 MG tablet   Commonly known as: ZEBETA   Take 10 mg by mouth 2 (two) times daily.      calcium-vitamin D 500-200 MG-UNIT  per tablet   Commonly known as: OSCAL WITH D   Take 1 tablet by mouth daily.      folic acid 1 MG tablet   Commonly known as: FOLVITE   Take 1 mg by mouth daily.      guaiFENesin 600 MG 12 hr tablet   Commonly known as: MUCINEX   Take 1 tablet (600 mg total) by mouth 2 (two) times daily.      HYDROcodone-acetaminophen 5-325 MG per tablet   Commonly known as: NORCO/VICODIN   Take 0.5-1 tablets by mouth every 4 (four) hours as needed. As needed for pain, may increase to one tab if needed      levofloxacin 500 MG tablet   Commonly known as: LEVAQUIN   Take 1 tablet (500 mg total) by mouth daily.      levothyroxine 50 MCG tablet   Commonly known as: SYNTHROID, LEVOTHROID   Take 50 mcg by mouth daily.      Methotrexate Sodium (PF) 100 MG/4ML Soln   Inject 0.4 mLs as directed once a week. friday      omeprazole 20 MG capsule   Commonly known as: PRILOSEC   Take 20 mg by  mouth daily.      predniSONE 5 MG tablet   Commonly known as: DELTASONE   Take 5 mg by mouth daily.      predniSONE 10 MG tablet   Commonly known as: DELTASONE   Please take 10 mg daily for 3 days from 04/22/12, then continue prior maintenance dose of 5 mg daily.      simvastatin 5 MG tablet   Commonly known as: ZOCOR   Take 5 mg by mouth at bedtime.      warfarin 2 MG tablet   Commonly known as: COUMADIN   Take 3 mg by mouth daily. Take 1.5 tablets (3mg ) daily.        Follow-up Information    Follow up with Thayer Headings, MD. Schedule an appointment as soon as possible for a visit in 1 week.   Contact information:   [address] [place] [address] [phone]           The results of significant diagnostics from this hospitalization (including imaging, microbiology, ancillary and laboratory) are listed below for reference.    Significant Diagnostic Studies: Dg Chest Portable 1 View  04/19/2012  *RADIOLOGY REPORT*  Clinical Data: Shortness of breath; hypoxia and  weakness.  PORTABLE CHEST - 1 VIEW  Comparison: CT of the chest performed 02/29/2012, and chest radiograph performed 12/28/2011  Findings: The lungs are hyperexpanded, with flattening of the hemidiaphragms, suggestive of COPD.  Right mid and lower lung zone airspace opacification is compatible with pneumonia.  Underlying vascular congestion is seen.  No pleural effusion or pneumothorax is appreciated.  The cardiomediastinal silhouette is borderline normal in size; the patient is status post median sternotomy.  A pacemaker is noted overlying the right chest wall, with leads ending overlying the right atrium and right ventricle.  No acute osseous abnormalities are seen.  IMPRESSION:  1.  Right-sided pneumonia noted. 2.  Findings suggestive of COPD; underlying vascular congestion seen.   Original Report Authenticated By: Tonia Ghent, M.D.     Microbiology: Recent Results (from the past 240 hour(s))  CULTURE, BLOOD (ROUTINE X 2)     Status: Normal (Preliminary result)   Collection Time   04/19/12  5:20 AM      Component Value Range Status Comment   Specimen Description BLOOD RIGHT ARM   Final    Special Requests BOTTLES DRAWN AEROBIC AND ANAEROBIC   Final    Culture  Setup Time 04/19/2012 12:51   Final    Culture     Final    Value:        BLOOD CULTURE RECEIVED NO GROWTH TO DATE CULTURE WILL BE HELD FOR 5 DAYS BEFORE ISSUING A FINAL NEGATIVE REPORT   Report Status PENDING   Incomplete   CULTURE, BLOOD (ROUTINE X 2)     Status: Normal (Preliminary result)   Collection Time   04/19/12  5:25 AM      Component Value Range Status Comment   Specimen Description BLOOD LEFT ANTECUBITAL   Final    Special Requests BOTTLES DRAWN AEROBIC ONLY   Final    Culture  Setup Time 04/19/2012 12:51   Final    Culture     Final    Value:        BLOOD CULTURE RECEIVED NO GROWTH TO DATE CULTURE WILL BE HELD FOR 5 DAYS BEFORE ISSUING A FINAL NEGATIVE REPORT   Report Status PENDING   Incomplete       Labs: Basic Metabolic  Panel:  Lab 04/21/12 0433 04/20/12 0448 04/19/12 0536 04/19/12 0525  NA 134* 133* 137 137  K 3.2* 3.9 4.0 4.1  CL 102 102 102 101  CO2 25 24 -- 25  GLUCOSE 154* 141* 127* 127*  BUN 18 23 27* 26*  CREATININE 0.80 0.94 1.10 0.89  CALCIUM 8.4 8.7 -- 9.1  MG -- -- -- --  PHOS -- -- -- --   Liver Function Tests:  Lab 04/20/12 0448 04/19/12 0525  AST 19 32  ALT 13 18  ALKPHOS 46 68  BILITOT 0.6 0.4  PROT 4.9* 6.0  ALBUMIN 2.5* 3.3*   No results found for this basename: LIPASE:5,AMYLASE:5 in the last 168 hours No results found for this basename: AMMONIA:5 in the last 168 hours CBC:  Lab 04/21/12 0433 04/20/12 0448 04/19/12 0536 04/19/12 0525  WBC 8.8 12.0* -- 6.8  NEUTROABS -- -- -- --  HGB 9.4* 10.0* 13.6 13.7  HCT 27.4* 29.1* 40.0 40.5  MCV 93.5 94.2 -- 93.5  PLT 132* 139* -- 220   Cardiac Enzymes:  Lab 04/19/12 0525  CKTOTAL --  CKMB --  CKMBINDEX --  TROPONINI <0.30   BNP: BNP (last 3 results)  Basename 05/26/11 0519  PROBNP 1788.0*   CBG: No results found for this basename: GLUCAP:5 in the last 168 hours     Signed:  Hashir Deleeuw,CHRISTOPHER  [facility] 04/21/2012, 1:49 PM

## 2012-04-21 NOTE — Progress Notes (Signed)
ANTICOAGULATION CONSULT NOTE - Follow Up  Pharmacy Consult for Warfarin Indication: AFib  No Known Allergies  Patient Measurements: Height: 5\' 7"  (170.2 cm) Weight: 101 lb 6.4 oz (45.995 kg) IBW/kg (Calculated) : 61.6   Vital Signs: Temp: 97.6 F (36.4 C) (11/29 0500) Temp src: Oral (11/29 0500) BP: 135/62 mmHg (11/29 0500) Pulse Rate: 81  (11/29 0500)  Labs:  Basename 04/21/12 0433 04/20/12 0448 04/19/12 0550 04/19/12 0536 04/19/12 0525  HGB 9.4* 10.0* -- -- --  HCT 27.4* 29.1* -- 40.0 --  PLT 132* 139* -- -- 220  APTT -- -- -- -- --  LABPROT 28.0* 30.6* 23.9* -- --  INR 2.79* 3.14* 2.25* -- --  HEPARINUNFRC -- -- -- -- --  CREATININE 0.80 0.94 -- 1.10 --  CKTOTAL -- -- -- -- --  CKMB -- -- -- -- --  TROPONINI -- -- -- -- <0.30    Estimated Creatinine Clearance: 35.3 ml/min (by C-G formula based on Cr of 0.8).   Medical History: Past Medical History  Diagnosis Date  . Hypokalemia   . GERD (gastroesophageal reflux disease)   . Temporal arteritis   . Atrial fibrillation   . Hypothyroidism   . Hyponatremia   . Angina   . Shortness of breath   . Hip fracture   . Pacemaker     Medications:  Scheduled:     . ipratropium  0.5 mg Nebulization Q6H   And  . albuterol  2.5 mg Nebulization Q6H  . benzonatate  100 mg Oral TID  . beta carotene w/minerals  1 tablet Oral Daily  . bisoprolol  10 mg Oral BID  . calcium-vitamin D  1 tablet Oral Daily  . folic acid  1 mg Oral Daily  . guaiFENesin  600 mg Oral BID  . hydrocortisone sod succinate (SOLU-CORTEF) injection  50 mg Intravenous Q12H  . levofloxacin (LEVAQUIN) IV  750 mg Intravenous Q48H  . levothyroxine  50 mcg Oral QAC breakfast  . pantoprazole  40 mg Oral Daily  . piperacillin-tazobactam (ZOSYN)  IV  3.375 g Intravenous Q8H  . simvastatin  5 mg Oral QHS  . sodium chloride  3 mL Intravenous Q12H  . sodium chloride  3 mL Intravenous Q12H  . Warfarin - Pharmacist Dosing Inpatient   Does not apply q1800     Infusions:    Assessment: [name] admitted 11/27 with CAP vs asp PNA. On chronic warfarin for hx of AFib. Home dose reported as 3mg  daily, last taken 11/26. INR therapeutic on admission (2.25) but jumped up to 3.14 on 11/28, possibly due to drug interaction with Levaquin vs change in dietary habits in hospital? INR back in goal range this am. Will resume warfarin with lower dose tonight. No bleeding reported in chart notes.  Goal of Therapy:  INR 2-3   Plan:  1) Warfarin 2mg  PO x1 at 18:00 2) Daily PT/INR 3) If patient is discharged today and sent home on Levaquin, suggest close INR follow up as outpatient (would schedule INR check for [facility] 12/2).  Darrol Angel, PharmD Pager: [phone] 04/21/2012,8:06 AM

## 2012-04-21 NOTE — Evaluation (Signed)
Occupational Therapy Evaluation Patient Details Name: Janice [name] MRN: [id number] DOB: 1922/04/10 Today's Date: 04/21/2012 Time: 0930-1001 OT Time Calculation (min): 31 min  OT Assessment / Plan / Recommendation Clinical Impression  This is an 78 year old female, with known history of temporal arteritis, atrial fibrillation on anticoagulation, s/p PPM, hypothyroidism, CAD, s/p previous right hip fracture about 15 years ago, s/p ORIF. Refracture noted 05/2011, but not amenable to surgery. Pt has 24 hr caregivers at home. Skilled OT indicated to maximize independence with BADLs back to baseline in prep for d/c home with HHOT.    OT Assessment  Patient needs continued OT Services    Follow Up Recommendations  No OT follow up    Barriers to Discharge      Equipment Recommendations  None recommended by OT    Recommendations for Other Services    Frequency  Min 2X/week    Precautions / Restrictions Precautions Precautions: Fall Restrictions Weight Bearing Restrictions: No   Pertinent Vitals/Pain No c/o pain    ADL  Grooming: Performed;Wash/dry face;Brushing hair;Set up Where Assessed - Grooming: Unsupported sitting Upper Body Bathing: Simulated;Set up Where Assessed - Upper Body Bathing: Unsupported sitting Lower Body Bathing: Simulated;Moderate assistance Where Assessed - Lower Body Bathing: Supported sit to stand Upper Body Dressing: Simulated;Set up Where Assessed - Upper Body Dressing: Unsupported sitting Lower Body Dressing: Simulated;Maximal assistance Where Assessed - Lower Body Dressing: Supported sit to stand Toilet Transfer: Performed;Minimal assistance Toilet Transfer Method: Surveyor, minerals: Materials engineer and Hygiene: Performed;Min guard Where Assessed - Engineer, mining and Hygiene: Sit to stand from 3-in-1 or toilet Equipment Used: Rolling walker;Gait belt Transfers/Ambulation Related  to ADLs: Pt ambulated in hallway with minguard A. Pt said she did feel fatigued at end of session. ADL Comments: Feel pt is close to baseline with ADLs. Has caregivers at home.    OT Diagnosis: Generalized weakness  OT Problem List: Decreased activity tolerance;Decreased knowledge of use of DME or AE;Impaired balance (sitting and/or standing) OT Treatment Interventions: Self-care/ADL training;Therapeutic activities;DME and/or AE instruction;Patient/family education   OT Goals Acute Rehab OT Goals OT Goal Formulation: With patient Time For Goal Achievement: 05/05/12 Potential to Achieve Goals: Good ADL Goals Pt Will Perform Grooming: with supervision;Standing at sink ADL Goal: Grooming - Progress: Goal set today Pt Will Transfer to Toilet: with supervision;with DME;Ambulation;Stand pivot transfer ADL Goal: Toilet Transfer - Progress: Goal set today Pt Will Perform Toileting - Clothing Manipulation: with supervision;Sitting on 3-in-1 or toilet;Standing ADL Goal: Toileting - Clothing Manipulation - Progress: Goal set today Pt Will Perform Toileting - Hygiene: with supervision;Sit to stand from 3-in-1/toilet ADL Goal: Toileting - Hygiene - Progress: Goal set today  Visit Information  Last OT Received On: 04/21/12 Assistance Needed: +1 PT/OT Co-Evaluation/Treatment: Yes    Subjective Data  Subjective: I'm just weak from laying in the bed. Patient Stated Goal: Return home today or tomorrow.   Prior Functioning     Home Living Lives With: Other (Comment) (caregivers) Available Help at Discharge: Available 24 hours/day Type of Home: House Home Access: Ramped entrance Home Layout: One level Bathroom Shower/Tub: Engineer, manufacturing systems: Handicapped height Home Adaptive Equipment: Wheelchair - manual;Walker - rolling;Bedside commode/3-in-1;Shower chair with back Prior Function Level of Independence: Needs assistance Needs Assistance: Bathing;Dressing;Meal Prep;Light  Housekeeping Bath: Moderate Dressing: Moderate Meal Prep: Total Light Housekeeping: Total Driving: No Vocation: Retired Comments: Pt states caregivers assist w LB ADLs. Communication Communication: No difficulties Dominant Hand: Right  Vision/Perception     Cognition  Overall Cognitive Status: Appears within functional limits for tasks assessed/performed Arousal/Alertness: Awake/alert Orientation Level: Appears intact for tasks assessed Behavior During Session: [facility] for tasks performed    Extremity/Trunk Assessment Right Upper Extremity Assessment RUE ROM/Strength/Tone: [facility] for tasks assessed Left Upper Extremity Assessment LUE ROM/Strength/Tone: [facility] for tasks assessed Right Lower Extremity Assessment RLE ROM/Strength/Tone: Deficits;Unable to fully assess;Due to pain RLE ROM/Strength/Tone Deficits: old hip fx and with limited hip flexion; strength >3/5 on right; postions leg in internal rotation during mobility--baseline for pt Left Lower Extremity Assessment LLE ROM/Strength/Tone: [facility] for tasks assessed Trunk Assessment Trunk Assessment: Kyphotic     Mobility Bed Mobility Bed Mobility: Supine to Sit Supine to Sit: 5: Supervision Details for Bed Mobility Assistance: increased time Transfers Sit to Stand: 4: Min assist;From bed Stand to Sit: To chair/3-in-1;4: Min assist Details for Transfer Assistance: subtle  cues for hand placement and safety     Shoulder Instructions     Exercise     Balance Static Standing Balance Static Standing - Balance Support: During functional activity;Left upper extremity supported Static Standing - Level of Assistance: 4: Min assist   End of Session OT - End of Session Equipment Utilized During Treatment: Gait belt Activity Tolerance: Patient tolerated treatment well Patient left: in chair;with call bell/phone within reach  GO     Grabiel Schmutz A OTR/L [phone] 04/21/2012, 1:33 PM

## 2012-04-21 NOTE — Evaluation (Signed)
Physical Therapy Evaluation Patient Details Name: Janice [name] MRN: [id number] DOB: March 23, 1922 Today's Date: 04/21/2012 Time: 0930-1000 PT Time Calculation (min): 30 min  PT Assessment / Plan / Recommendation Clinical Impression  This is an 78 year old female, with known history of temporal arteritis, atrial fibrillation on anticoagulation, s/p PPM, hypothyroidism, CAD, s/p previous right hip fracture about 15 years ago, s/p ORIF. Refracture noted 05/2011, but not amenable to surgery. (pt reports that hardware came loose) Now has difficulty ambulating, Pt had  what appeared to be "hiccups", since 04/17/12. On 04/18/12, she developed right-sided pain in the lower chest, posteriorly. She also experienced chills, but no fever. On 04/19/12, patient started vomiting, and this was followed by coughing and marked shortness of breath. She had no fever or diarrhea. EMS was called. EMS reports room air pulse oximeter in the mid 80s improved with 3 L oxygen nasal cannula.  Pt will benefit from Pt to maximize independence for return to home setting with 24hr care.    PT Assessment  Patient needs continued PT services    Follow Up Recommendations  No PT follow up    Does the patient have the potential to tolerate intense rehabilitation      Barriers to Discharge        Equipment Recommendations  None recommended by PT    Recommendations for Other Services     Frequency Min 3X/week    Precautions / Restrictions Precautions Precautions: Fall Restrictions Weight Bearing Restrictions: No   Pertinent Vitals/Pain C/o right hip soreness, was premedicated      Mobility  Bed Mobility Bed Mobility: Supine to Sit Supine to Sit: 5: Supervision Details for Bed Mobility Assistance: increased time Transfers Transfers: Sit to Stand;Stand to Sit Sit to Stand: 4: Min assist;From bed Stand to Sit: To chair/3-in-1;4: Min assist Details for Transfer Assistance: subtle  cues for hand placement and  safety Ambulation/Gait Ambulation/Gait Assistance: 4: Min assist;4: Min Government social research officer (Feet): 120 Feet Assistive device: Rolling walker Gait Pattern: Step-to pattern;Trunk flexed;Decreased stance time - right Gait velocity: decreased General Gait Details: RLE in internal rotation; fatigued after amb but reports feeling better; first time she has been up per pt    Shoulder Instructions     Exercises     PT Diagnosis: Difficulty walking  PT Problem List: Decreased strength;Decreased range of motion;Decreased activity tolerance;Decreased balance;Decreased mobility PT Treatment Interventions: DME instruction;Gait training;Stair training;Functional mobility training;Therapeutic activities;Therapeutic exercise;Balance training;Patient/family education   PT Goals Acute Rehab PT Goals PT Goal Formulation: With patient Time For Goal Achievement: 04/28/12 Potential to Achieve Goals: Good Pt will go Supine/Side to Sit: with modified independence PT Goal: Supine/Side to Sit - Progress: Goal set today Pt will go Sit to Stand: with modified independence PT Goal: Sit to Stand - Progress: Goal set today Pt will go Stand to Sit: with supervision PT Goal: Stand to Sit - Progress: Goal set today Pt will Ambulate: with supervision;51 - 150 feet;with least restrictive assistive device PT Goal: Ambulate - Progress: Goal set today  Visit Information  Last PT Received On: 04/21/12 Assistance Needed: +1 PT/OT Co-Evaluation/Treatment: Yes    Subjective Data  Subjective: I am so glad you came, I was getting stir crazy! Patient Stated Goal: home with continued 24hr care   Prior Functioning  Home Living Lives With: Other (Comment) (caregivers) Available Help at Discharge: Available 24 hours/day Type of Home: House Home Access: Ramped entrance Home Layout: One level Bathroom Shower/Tub: Engineer, manufacturing systems: Handicapped height  Home Adaptive Equipment: Wheelchair -  manual;Walker - rolling;Bedside commode/3-in-1;Shower chair with back Prior Function Level of Independence: Needs assistance Needs Assistance: Bathing;Dressing;Meal Prep;Light Housekeeping Bath: Moderate Dressing: Moderate Meal Prep: Total Light Housekeeping: Total Driving: No Vocation: Retired Comments: amb with RW prior to adm Communication Communication: No difficulties Dominant Hand: Right    Cognition  Overall Cognitive Status: Appears within functional limits for tasks assessed/performed Arousal/Alertness: Awake/alert Orientation Level: Appears intact for tasks assessed Behavior During Session: [facility] for tasks performed    Extremity/Trunk Assessment Right Lower Extremity Assessment RLE ROM/Strength/Tone: Deficits;Unable to fully assess;Due to pain RLE ROM/Strength/Tone Deficits: old hip fx and with limited hip flexion; strength >3/5 on right; postions leg in internal rotation during mobility--baseline for pt Left Lower Extremity Assessment LLE ROM/Strength/Tone: [facility] for tasks assessed Trunk Assessment Trunk Assessment: Kyphotic   Balance Static Standing Balance Static Standing - Balance Support: During functional activity;Left upper extremity supported Static Standing - Level of Assistance: 4: Min assist  End of Session PT - End of Session Equipment Utilized During Treatment: Gait belt Activity Tolerance: Patient tolerated treatment well Patient left: in chair;with call bell/phone within reach Nurse Communication: Mobility status  GP     [facility] 04/21/2012, 11:56 AM
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
## 2012-04-25 LAB — CULTURE, BLOOD (ROUTINE X 2): Culture: NO GROWTH

## 2012-06-02 DIAGNOSIS — I495 Sick sinus syndrome: Secondary | ICD-10-CM

## 2012-10-13 ENCOUNTER — Emergency Department (HOSPITAL_COMMUNITY): Payer: Medicare Other

## 2012-10-13 ENCOUNTER — Inpatient Hospital Stay (HOSPITAL_COMMUNITY)
Admission: EM | Admit: 2012-10-13 | Discharge: 2012-10-18 | DRG: 872 | Disposition: A | Discharge status: Home Health | Payer: Medicare Other | Attending: Internal Medicine | Admitting: Internal Medicine

## 2012-10-13 ENCOUNTER — Encounter (HOSPITAL_COMMUNITY): Payer: Self-pay | Admitting: *Deleted

## 2012-10-13 DIAGNOSIS — R509 Fever, unspecified: Secondary | ICD-10-CM

## 2012-10-13 DIAGNOSIS — E876 Hypokalemia: Secondary | ICD-10-CM | POA: Diagnosis present

## 2012-10-13 DIAGNOSIS — Z950 Presence of cardiac pacemaker: Secondary | ICD-10-CM

## 2012-10-13 DIAGNOSIS — L0291 Cutaneous abscess, unspecified: Secondary | ICD-10-CM

## 2012-10-13 DIAGNOSIS — I4891 Unspecified atrial fibrillation: Secondary | ICD-10-CM | POA: Diagnosis present

## 2012-10-13 DIAGNOSIS — J9691 Respiratory failure, unspecified with hypoxia: Secondary | ICD-10-CM

## 2012-10-13 DIAGNOSIS — I509 Heart failure, unspecified: Secondary | ICD-10-CM

## 2012-10-13 DIAGNOSIS — E46 Unspecified protein-calorie malnutrition: Secondary | ICD-10-CM | POA: Diagnosis present

## 2012-10-13 DIAGNOSIS — L97809 Non-pressure chronic ulcer of other part of unspecified lower leg with unspecified severity: Secondary | ICD-10-CM | POA: Diagnosis present

## 2012-10-13 DIAGNOSIS — I959 Hypotension, unspecified: Secondary | ICD-10-CM

## 2012-10-13 DIAGNOSIS — E860 Dehydration: Secondary | ICD-10-CM

## 2012-10-13 DIAGNOSIS — Z7901 Long term (current) use of anticoagulants: Secondary | ICD-10-CM

## 2012-10-13 DIAGNOSIS — J189 Pneumonia, unspecified organism: Secondary | ICD-10-CM

## 2012-10-13 DIAGNOSIS — Z9889 Other specified postprocedural states: Secondary | ICD-10-CM

## 2012-10-13 DIAGNOSIS — Z681 Body mass index (BMI) 19 or less, adult: Secondary | ICD-10-CM

## 2012-10-13 DIAGNOSIS — L02419 Cutaneous abscess of limb, unspecified: Secondary | ICD-10-CM | POA: Diagnosis present

## 2012-10-13 DIAGNOSIS — K219 Gastro-esophageal reflux disease without esophagitis: Secondary | ICD-10-CM

## 2012-10-13 DIAGNOSIS — D649 Anemia, unspecified: Secondary | ICD-10-CM

## 2012-10-13 DIAGNOSIS — E43 Unspecified severe protein-calorie malnutrition: Secondary | ICD-10-CM

## 2012-10-13 DIAGNOSIS — M316 Other giant cell arteritis: Secondary | ICD-10-CM

## 2012-10-13 DIAGNOSIS — A419 Sepsis, unspecified organism: Principal | ICD-10-CM

## 2012-10-13 DIAGNOSIS — L0390 Cellulitis, unspecified: Secondary | ICD-10-CM

## 2012-10-13 DIAGNOSIS — E039 Hypothyroidism, unspecified: Secondary | ICD-10-CM | POA: Diagnosis present

## 2012-10-13 LAB — CBC WITH DIFFERENTIAL/PLATELET
Basophils Absolute: 0 10*3/uL (ref 0.0–0.1)
Basophils Relative: 0 % (ref 0–1)
Eosinophils Absolute: 0 10*3/uL (ref 0.0–0.7)
Eosinophils Relative: 0 % (ref 0–5)
HCT: 43.5 % (ref 36.0–46.0)
MCH: 30.5 pg (ref 26.0–34.0)
MCHC: 34.3 g/dL (ref 30.0–36.0)
MCV: 89 fL (ref 78.0–100.0)
Monocytes Absolute: 0.2 10*3/uL (ref 0.1–1.0)
RDW: 14.6 % (ref 11.5–15.5)

## 2012-10-13 LAB — BASIC METABOLIC PANEL
Calcium: 8.9 mg/dL (ref 8.4–10.5)
Creatinine, Ser: 0.83 mg/dL (ref 0.50–1.10)
GFR calc Af Amer: 70 mL/min — ABNORMAL LOW (ref >90)

## 2012-10-13 LAB — URINALYSIS, ROUTINE W REFLEX MICROSCOPIC
Glucose, UA: NEGATIVE mg/dL
Hgb urine dipstick: NEGATIVE
Ketones, ur: NEGATIVE mg/dL
Leukocytes, UA: NEGATIVE
Protein, ur: NEGATIVE mg/dL

## 2012-10-13 LAB — PROTIME-INR: INR: 3.28 — ABNORMAL HIGH (ref 0.00–1.49)

## 2012-10-13 LAB — LACTIC ACID, PLASMA: Lactic Acid, Venous: 1.4 mmol/L (ref 0.5–2.2)

## 2012-10-13 MED ORDER — SODIUM CHLORIDE 0.9 % IV BOLUS (SEPSIS)
1000.0000 mL | Freq: Once | INTRAVENOUS | Status: AC
  Administered 2012-10-13: 1000 mL via INTRAVENOUS

## 2012-10-13 MED ORDER — DEXTROSE 5 % IV SOLN
500.0000 mg | Freq: Once | INTRAVENOUS | Status: AC
  Administered 2012-10-13: 500 mg via INTRAVENOUS
  Filled 2012-10-13: qty 500

## 2012-10-13 MED ORDER — SODIUM CHLORIDE 0.9 % IV SOLN
INTRAVENOUS | Status: DC
  Administered 2012-10-13: 100 mL/h via INTRAVENOUS
  Administered 2012-10-13: 22:00:00 via INTRAVENOUS
  Administered 2012-10-14: 1000 mL via INTRAVENOUS
  Administered 2012-10-15: 03:00:00 via INTRAVENOUS
  Administered 2012-10-17: 10 mL/h via INTRAVENOUS

## 2012-10-13 MED ORDER — PRO-STAT SUGAR FREE PO LIQD
30.0000 mL | Freq: Two times a day (BID) | ORAL | Status: DC
  Administered 2012-10-14 – 2012-10-18 (×9): 30 mL via ORAL
  Filled 2012-10-13 (×12): qty 30

## 2012-10-13 MED ORDER — ACETAMINOPHEN 650 MG RE SUPP
650.0000 mg | Freq: Four times a day (QID) | RECTAL | Status: DC | PRN
  Administered 2012-10-13: 650 mg via RECTAL
  Filled 2012-10-13: qty 1

## 2012-10-13 MED ORDER — ACETAMINOPHEN 325 MG PO TABS
650.0000 mg | ORAL_TABLET | Freq: Once | ORAL | Status: AC
  Administered 2012-10-13: 650 mg via ORAL
  Filled 2012-10-13: qty 2

## 2012-10-13 MED ORDER — VANCOMYCIN HCL 500 MG IV SOLR
500.0000 mg | INTRAVENOUS | Status: DC
  Administered 2012-10-13 – 2012-10-17 (×5): 500 mg via INTRAVENOUS
  Filled 2012-10-13 (×6): qty 500

## 2012-10-13 MED ORDER — ENSURE COMPLETE PO LIQD
237.0000 mL | Freq: Two times a day (BID) | ORAL | Status: DC
  Administered 2012-10-16 – 2012-10-17 (×2): 237 mL via ORAL

## 2012-10-13 MED ORDER — POTASSIUM CHLORIDE CRYS ER 20 MEQ PO TBCR
40.0000 meq | EXTENDED_RELEASE_TABLET | Freq: Two times a day (BID) | ORAL | Status: AC
  Administered 2012-10-13 (×2): 40 meq via ORAL
  Filled 2012-10-13 (×2): qty 1

## 2012-10-13 MED ORDER — ACETAMINOPHEN 325 MG PO TABS
650.0000 mg | ORAL_TABLET | Freq: Four times a day (QID) | ORAL | Status: DC | PRN
  Administered 2012-10-14 (×2): 650 mg via ORAL
  Administered 2012-10-15: 325 mg via ORAL
  Administered 2012-10-16: 650 mg via ORAL
  Filled 2012-10-13 (×4): qty 2

## 2012-10-13 MED ORDER — PIPERACILLIN-TAZOBACTAM 3.375 G IVPB
3.3750 g | Freq: Three times a day (TID) | INTRAVENOUS | Status: DC
  Administered 2012-10-13 – 2012-10-16 (×9): 3.375 g via INTRAVENOUS
  Filled 2012-10-13 (×10): qty 50

## 2012-10-13 MED ORDER — LEVOTHYROXINE SODIUM 50 MCG PO TABS
50.0000 ug | ORAL_TABLET | Freq: Every day | ORAL | Status: DC
  Administered 2012-10-13 – 2012-10-18 (×6): 50 ug via ORAL
  Filled 2012-10-13 (×7): qty 1

## 2012-10-13 MED ORDER — DEXTROSE 5 % IV SOLN
1.0000 g | Freq: Once | INTRAVENOUS | Status: AC
  Administered 2012-10-13: 1 g via INTRAVENOUS
  Filled 2012-10-13: qty 10

## 2012-10-13 MED ORDER — HYDROCODONE-ACETAMINOPHEN 5-325 MG PO TABS
1.0000 | ORAL_TABLET | ORAL | Status: DC | PRN
  Administered 2012-10-13: 1 via ORAL
  Filled 2012-10-13: qty 1

## 2012-10-13 MED ORDER — ADULT MULTIVITAMIN W/MINERALS CH
1.0000 | ORAL_TABLET | Freq: Every day | ORAL | Status: DC
  Administered 2012-10-14 – 2012-10-18 (×5): 1 via ORAL
  Filled 2012-10-13 (×6): qty 1

## 2012-10-13 MED ORDER — SODIUM CHLORIDE 0.9 % IV BOLUS (SEPSIS)
500.0000 mL | Freq: Once | INTRAVENOUS | Status: AC
  Administered 2012-10-13: 500 mL via INTRAVENOUS

## 2012-10-13 MED ORDER — CEFTRIAXONE SODIUM 1 G IJ SOLR: 1.0000 g | Freq: Once | INTRAMUSCULAR | Status: DC

## 2012-10-13 MED ORDER — POLYVINYL ALCOHOL 1.4 % OP SOLN
1.0000 [drp] | OPHTHALMIC | Status: DC
  Administered 2012-10-13 – 2012-10-18 (×52): 1 [drp] via OPHTHALMIC
  Filled 2012-10-13: qty 15

## 2012-10-13 NOTE — Progress Notes (Signed)
Pt b/p 83/43 (54), [facility], NP was notified.  Pt receiving 500 ml, bolus.  Pt. Has a rectal temp of 102.3  Conley Rolls, RN

## 2012-10-13 NOTE — Progress Notes (Signed)
Patient takes Vicodin at home for hip and generalized pain.  Patient requested this medication for her right hip pain.  Called Dr. Sharl Ma and requested an order for the patient's pain medication.  After the ordered received, administered one hydrocodone tablet.  Patient became very lethargic.  Notified Dr. Sharl Ma and the hydrocodone ordered discontinued.  Patient is arousable.  Will continue to monitor.

## 2012-10-13 NOTE — Progress Notes (Signed)
CARE MANAGEMENT NOTE 10/13/2012  Patient:  Janice [name], Janice [name]   Account Number:  [id number]  Date Initiated:  10/13/2012  Documentation initiated by:  DAVIS,RHONDA  Subjective/Objective Assessment:   pt with sepsis and hypotensive, lethgaric on admit.     Action/Plan:   [facility]   Anticipated DC Date:  10/16/2012   Anticipated DC Plan:  SKILLED NURSING FACILITY  In-house referral  Clinical Social Worker      DC Planning Services  NA      [facility] Choice  NA   Choice offered to / List presented to:  NA   DME arranged  NA      DME agency  NA     HH arranged  NA      HH agency  NA   Status of service:  In process, will continue to follow Medicare Important Message given?  NA - LOS <3 / Initial given by admissions (If response is "NO", the following Medicare IM given date fields will be blank) Date Medicare IM given:   Date Additional Medicare IM given:    Discharge Disposition:    Per UR Regulation:  Reviewed for med. necessity/level of care/duration of stay  If discussed at Long Length of Stay Meetings, dates discussed:    Comments:  [id number] Earlene Plater, RN, BSN, CCM:  CHART REVIEWED AND UPDATED.  Next chart review due on [id number]. NO DISCHARGE NEEDS PRESENT AT THIS TIME. CASE MANAGEMENT [phone]

## 2012-10-13 NOTE — Consult Note (Addendum)
WOC consult Note Reason for Consult:LLE wound Wound type:Venous insufficiency Pressure Ulcer POA: No Measurement:3.5cm x 1.5cm x 0.2cm Wound XBJ:YNWGN, pink, non-granulating Drainage (amount, consistency, odor) scant amount of dried exudate on old dressing Periwound:intact, with hemosiderin staining and mild edema Dressing procedure/placement/frequency:I will cover and protect with a moisture retentive dressing (soft silicone foam) to facilitate healing.  As patient is primarily in bed at this time, will not suggest compression wrapping, but this may be considered upon discharge, depending on her status.  HHRN or treatment nurse at a skill facility could apply Unna's Boot over the foam dressing topical care provided to enhance healing once she is ambulatory.  In the meantime, orders are provided for floatation of the heels while in bed and for elevation of the LEs while OOB in chair. I will not follow.  Please re-consult if needed. Thanks, Ladona Mow, MSN, RN, [facility], CWOCN [phone])

## 2012-10-13 NOTE — Plan of Care (Signed)
Problem: Phase I Progression Outcomes Goal: OOB as tolerated unless otherwise ordered Outcome: Not Applicable Date Met:  10/13/12 Bed rest today.

## 2012-10-13 NOTE — Progress Notes (Signed)
Patient is alert and oriented.  She reports that she has an unrepaired hip fracture.  Notified Dr. Sharl Ma to obtain an order for her scheduled home pain medication.
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
## 2012-10-13 NOTE — ED Notes (Signed)
EMS called to [facility] by staff for fever that started yesterday and has progressed to fever with chills. V/S stable. Temp at facility was 102.5

## 2012-10-13 NOTE — Progress Notes (Signed)
One small urine occurrence at 9 am.  Patient has been unable to urinate since 9 am.  Placed the patient on a bed pan but she was unable to urinate.  Notified Dr. Sharl Ma.  Foley Catheter placed per MD order.  Immediate urine return of 150 ml of amber, odorous urine.  Will continue to monitor urine output.
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
## 2012-10-13 NOTE — Progress Notes (Signed)
ANTIBIOTIC CONSULT NOTE - INITIAL  Pharmacy Consult for Vancomycin, Zosyn Indication: cellulitis  No Known Allergies  Patient Measurements: Height: 5\' 7"  (170.2 cm) Weight: 103 lb 6.3 oz (46.9 kg) IBW/kg (Calculated) : 61.6  Vital Signs: Temp: 99.9 [name] (37.7 C) (05/Janice 0707) Temp src: Rectal (05/Janice 0707) BP: 104/38 mmHg (05/Janice 0900) Pulse Rate: 73 (05/Janice 0900) Intake/Output from previous day: 05/22 0701 - 05/Janice 0700 In: -  Out: 250 [Urine:250]  Labs:  Recent Labs  05/Janice/14 0520  WBC 9.3  HGB 14.9  PLT 145*  CREATININE 0.83   Estimated Creatinine Clearance: 34 ml/min (by C-G formula based on Cr of 0.83).  Microbiology: No results found for this or any previous visit (from the past 720 hour(s)).  Medical History: Past Medical History  Diagnosis Date  . Hypokalemia   . GERD (gastroesophageal reflux disease)   . Temporal arteritis   . Atrial fibrillation   . Hypothyroidism   . Hyponatremia   . Angina   . Shortness of breath   . Hip fracture   . Pacemaker     Medications:  Anti-infectives   Start     Dose/Rate Route Frequency Ordered Stop   05/Janice/14 0630  cefTRIAXone (ROCEPHIN) 1 g in dextrose 5 % 50 mL IVPB     1 g 100 mL/hr over 30 Minutes Intravenous  Once 05/Janice/14 0617 05/Janice/14 0844   05/Janice/14 0545  cefTRIAXone (ROCEPHIN) injection 1 g  Status:  Discontinued     1 g Intramuscular  Once 05/Janice/14 0541 05/Janice/14 0617   05/Janice/14 0545  azithromycin (ZITHROMAX) 500 mg in dextrose 5 % 250 mL IVPB     500 mg 250 mL/hr over 60 Minutes Intravenous  Once 05/Janice/14 0541 05/Janice/14 0816     Assessment: 79 yo [name] from SNF admit on 5/Janice for fever, SIRS, cellulitis around LLE ulcer.  Pharmacy asked to assist with antibiotic dosing.  Azithromycin and ceftriaxone given x1 dose, pharmacy asked to dose vancomycin and zosyn.  SCr 0.83 with CrCl ~ 34 ml/min  WBC remain wnl, 9.3  Tm 103.5, now down to 99.9  Goal of Therapy:  Vancomycin trough level 10-15 mcg/ml  Plan:    Zosyn 3.375g IV Q8H infused over 4hrs.  Vancomycin 500mg  IV q24h.  Measure Vanc trough at steady state.  Follow up renal fxn and culture results.   Lynann Beaver PharmD, BCPS Pager [phone] 5/Janice/2014 9:20 AM

## 2012-10-13 NOTE — ED Notes (Signed)
Patient is alert and will respond to commands.  She states that she is weak and shaking.  She currently has a temp of 103.5 with output of 

## 2012-10-14 DIAGNOSIS — D649 Anemia, unspecified: Secondary | ICD-10-CM

## 2012-10-14 LAB — CBC
HCT: 24 % — ABNORMAL LOW (ref 36.0–46.0)
Hemoglobin: 7.8 g/dL — ABNORMAL LOW (ref 12.0–15.0)
MCV: 91.6 fL (ref 78.0–100.0)
MCV: 91.9 fL (ref 78.0–100.0)
Platelets: 161 10*3/uL (ref 150–400)
RBC: 2.62 MIL/uL — ABNORMAL LOW (ref 3.87–5.11)
RBC: 2.7 MIL/uL — ABNORMAL LOW (ref 3.87–5.11)
RDW: 15.3 % (ref 11.5–15.5)
WBC: 13.8 10*3/uL — ABNORMAL HIGH (ref 4.0–10.5)
WBC: 12.6 10*3/uL — ABNORMAL HIGH (ref 4.0–10.5)

## 2012-10-14 LAB — COMPREHENSIVE METABOLIC PANEL
Alkaline Phosphatase: 64 U/L (ref 39–117)
BUN: 21 mg/dL (ref 6–23)
CO2: 21 mEq/L (ref 19–32)
Chloride: 108 mEq/L (ref 96–112)
Creatinine, Ser: 0.88 mg/dL (ref 0.50–1.10)
GFR calc non Af Amer: 57 mL/min — ABNORMAL LOW (ref >90)
Glucose, Bld: 109 mg/dL — ABNORMAL HIGH (ref 70–99)
Potassium: 3.8 mEq/L (ref 3.5–5.1)
Total Bilirubin: 0.3 mg/dL (ref 0.3–1.2)

## 2012-10-14 LAB — URINE CULTURE: Colony Count: NO GROWTH

## 2012-10-14 LAB — RETICULOCYTES
Retic Count, Absolute: 45.9 10*3/uL (ref 19.0–186.0)
Retic Ct Pct: 1.7 % (ref 0.4–3.1)

## 2012-10-14 LAB — HAPTOGLOBIN: Haptoglobin: 166 mg/dL (ref 45–215)

## 2012-10-14 LAB — HEMOGLOBIN AND HEMATOCRIT, BLOOD
HCT: 28.7 % — ABNORMAL LOW (ref 36.0–46.0)
Hemoglobin: 9.5 g/dL — ABNORMAL LOW (ref 12.0–15.0)
Hemoglobin: 8.2 g/dL — ABNORMAL LOW (ref 12.0–15.0)

## 2012-10-14 LAB — VITAMIN B12: Vitamin B-12: 271 pg/mL (ref 211–911)

## 2012-10-14 LAB — IRON AND TIBC: Iron: <10 ug/dL — ABNORMAL LOW (ref 42–135)

## 2012-10-14 LAB — FERRITIN: Ferritin: 334 ng/mL — ABNORMAL HIGH (ref 10–291)

## 2012-10-14 LAB — LACTATE DEHYDROGENASE: LDH: 177 U/L (ref 94–250)

## 2012-10-14 MED ORDER — WARFARIN - PHARMACIST DOSING INPATIENT: Freq: Every day | Status: DC

## 2012-10-14 MED ORDER — SODIUM CHLORIDE 0.9 % IV BOLUS (SEPSIS)
1000.0000 mL | Freq: Once | INTRAVENOUS | Status: AC
  Administered 2012-10-14: 1000 mL via INTRAVENOUS

## 2012-10-14 NOTE — Progress Notes (Signed)
ANTICOAGULATION CONSULT NOTE - Initial Consult  Pharmacy Consult for warfarin Indication: atrial fibrillation  No Known Allergies  Patient Measurements: Height: 5\' 7"  (170.2 cm) Weight: 103 lb 6.3 oz (46.9 kg) IBW/kg (Calculated) : 61.6 Heparin Dosing Weight:   Vital Signs: Temp: 97.7 F (36.5 C) (05/24 0800) Temp src: Oral (05/24 0800) BP: 97/48 mmHg (05/24 1000) Pulse Rate: 81 (05/24 1000)  Labs:  Recent Labs  10/13/12 0520 10/14/12 0330 10/14/12 0529 10/14/12 0845  HGB 14.9 7.8* 8.1* 9.5*  HCT 43.5 24.0* 24.8* 28.7*  PLT 145* 161 161  --   LABPROT 31.6*  --   --  31.3*  INR 3.28*  --   --  3.24*  CREATININE 0.83 0.88  --   --     Estimated Creatinine Clearance: 32.1 ml/min (by C-G formula based on Cr of 0.88).   Medical History: Past Medical History  Diagnosis Date  . Hypokalemia   . GERD (gastroesophageal reflux disease)   . Temporal arteritis   . Atrial fibrillation   . Hypothyroidism   . Hyponatremia   . Angina   . Shortness of breath   . Hip fracture   . Pacemaker     Assessment: Janice [name] admitted from SNF with fever and lethargy, She is on chronic warfarin therapy for chronic afib, warfarin held at admission for SUPRAtherapeutic INR (=3.28). Orders for pharmacy to resume warfarin dosing. Home dose: 3mg  daily   INR today = 3.24  CBC: Hgb 9.5 (improved), platelets OK   Goal of Therapy:  INR 2-3 Monitor platelets by anticoagulation protocol: Yes   Plan:   Continue to hold warfarin and check daily INR until INR appropriate to resume  [name], PharmD, BCPS.   Pager: [phone] 10/14/2012,10:39 AM

## 2012-10-14 NOTE — Progress Notes (Signed)
[facility], NP called d/t pt b/p and u/o decreased still after 2 Liters NS bolus total and NS primary IV fluids.  No new orders at this time am MD to f/u.   Conley Rolls, RN

## 2012-10-14 NOTE — H&P (Signed)
B/P remains low 86/33 (50). Pt mental status unchanged. [facility], NP informed.  New orders received and initiated. Conley Rolls, RN

## 2012-10-14 NOTE — Progress Notes (Signed)
Anemia   Repeat CBC to confirm drop in Hgb. Obtain anemia panel including hemolysis studies.
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
## 2012-10-15 DIAGNOSIS — I4891 Unspecified atrial fibrillation: Secondary | ICD-10-CM

## 2012-10-15 DIAGNOSIS — L0291 Cutaneous abscess, unspecified: Secondary | ICD-10-CM

## 2012-10-15 DIAGNOSIS — E039 Hypothyroidism, unspecified: Secondary | ICD-10-CM

## 2012-10-15 DIAGNOSIS — L0390 Cellulitis, unspecified: Secondary | ICD-10-CM

## 2012-10-15 LAB — COMPREHENSIVE METABOLIC PANEL
AST: 22 U/L (ref 0–37)
BUN: 25 mg/dL — ABNORMAL HIGH (ref 6–23)
CO2: 20 mEq/L (ref 19–32)
Calcium: 7.9 mg/dL — ABNORMAL LOW (ref 8.4–10.5)
Creatinine, Ser: 0.76 mg/dL (ref 0.50–1.10)
GFR calc non Af Amer: 73 mL/min — ABNORMAL LOW (ref >90)
Total Bilirubin: 0.2 mg/dL — ABNORMAL LOW (ref 0.3–1.2)

## 2012-10-15 LAB — WOUND CULTURE
Gram Stain: NONE SEEN
Special Requests: NORMAL

## 2012-10-15 LAB — CULTURE, BLOOD (ROUTINE X 2)

## 2012-10-15 LAB — OCCULT BLOOD X 1 CARD TO LAB, STOOL: Fecal Occult Bld: NEGATIVE

## 2012-10-15 LAB — CBC
HCT: 26.6 % — ABNORMAL LOW (ref 36.0–46.0)
MCH: 30 pg (ref 26.0–34.0)
MCV: 92.7 fL (ref 78.0–100.0)
Platelets: 192 10*3/uL (ref 150–400)
RBC: 2.87 MIL/uL — ABNORMAL LOW (ref 3.87–5.11)
RDW: 15.5 % (ref 11.5–15.5)

## 2012-10-15 LAB — HEMOGLOBIN AND HEMATOCRIT, BLOOD
HCT: 25.1 % — ABNORMAL LOW (ref 36.0–46.0)
Hemoglobin: 8.2 g/dL — ABNORMAL LOW (ref 12.0–15.0)

## 2012-10-15 LAB — PROTIME-INR
INR: 3.01 — ABNORMAL HIGH (ref 0.00–1.49)
Prothrombin Time: 29.6 seconds — ABNORMAL HIGH (ref 11.6–15.2)

## 2012-10-15 MED ORDER — FUROSEMIDE 10 MG/ML IJ SOLN
20.0000 mg | Freq: Two times a day (BID) | INTRAMUSCULAR | Status: DC
  Administered 2012-10-15 – 2012-10-17 (×5): 20 mg via INTRAVENOUS
  Filled 2012-10-15 (×6): qty 2

## 2012-10-15 MED ORDER — FUROSEMIDE 10 MG/ML IJ SOLN: 20.0000 mg | Freq: Once | INTRAMUSCULAR | Status: AC

## 2012-10-15 MED ORDER — FUROSEMIDE 10 MG/ML IJ SOLN
INTRAMUSCULAR | Status: AC
  Administered 2012-10-15: 20 mg via INTRAVENOUS
  Filled 2012-10-15: qty 4

## 2012-10-15 MED ORDER — FUROSEMIDE 10 MG/ML IJ SOLN
INTRAMUSCULAR | Status: AC
  Filled 2012-10-15: qty 4

## 2012-10-15 NOTE — Progress Notes (Signed)
ANTIBIOTIC CONSULT NOTE - Follow-up  Pharmacy Consult for Vancomycin, Zosyn Indication: cellulitis  No Known Allergies  Patient Measurements: Height: 5\' 7"  (170.2 cm) Weight: 103 lb 6.3 oz (46.9 kg) IBW/kg (Calculated) : 61.6  Vital Signs: Temp: 98.5 F (36.9 C) (05/25 0400) Temp src: Oral (05/25 0400) BP: 126/52 mmHg (05/25 0000) Pulse Rate: 85 (05/25 0000) Intake/Output from previous day: 05/24 0701 - 05/25 0700 In: 3697.5 [P.O.:1230; I.V.:2200; IV Piggyback:237.5] Out: 493 [Urine:492; Stool:1]  Labs:  Recent Labs  10/13/12 0520 10/14/12 0330 10/14/12 0529  10/14/12 1546 10/14/12 2343 10/15/12 0403  WBC 9.3 13.8* 12.6*  --   --   --  6.6  HGB 14.9 7.8* 8.1*  < > 8.2* 8.2* 8.6*  PLT 145* 161 161  --   --   --  192  CREATININE 0.83 0.88  --   --   --   --  0.76  < > = values in this interval not displayed. Estimated Creatinine Clearance: 35.3 ml/min (by C-G formula based on Cr of 0.76).  Microbiology: Recent Results (from the past 720 hour(s))  URINE CULTURE     Status: None   Collection Time    10/13/12  4:58 AM      Result Value Range Status   Specimen Description URINE, CATHETERIZED   Final   Special Requests Normal   Final   Culture  Setup Time 10/13/2012 14:48   Final   Colony Count NO GROWTH   Final   Culture NO GROWTH   Final   Report Status 10/14/2012 FINAL   Final  CULTURE, BLOOD (ROUTINE X 2)     Status: None   Collection Time    10/13/12  5:20 AM      Result Value Range Status   Specimen Description BLOOD LEFT ANTECUBITAL   Final   Special Requests BOTTLES DRAWN AEROBIC AND ANAEROBIC   Final   Culture  Setup Time 10/13/2012 09:33   Final   Culture     Final   Value: GRAM POSITIVE COCCI IN CHAINS     Note: Gram Stain Report Called to,Read Back By and Verified With: JENNEL D AT 0352 10/14/12 BY LSNOW   Report Status PENDING   Incomplete  CULTURE, BLOOD (ROUTINE X 2)     Status: None   Collection Time    10/13/12  6:05 AM      Result Value  Range Status   Specimen Description BLOOD RIGHT FOREARM   Final   Special Requests BOTTLES DRAWN AEROBIC AND ANAEROBIC   Final   Culture  Setup Time 10/13/2012 09:35   Final   Culture     Final   Value:        BLOOD CULTURE RECEIVED NO GROWTH TO DATE CULTURE WILL BE HELD FOR 5 DAYS BEFORE ISSUING A FINAL NEGATIVE REPORT   Report Status PENDING   Incomplete  MRSA PCR SCREENING     Status: None   Collection Time    10/13/12  9:07 AM      Result Value Range Status   MRSA by PCR NEGATIVE  NEGATIVE Final   Comment:            The GeneXpert MRSA Assay (FDA     approved for NASAL specimens     only), is one component of a     comprehensive MRSA colonization     surveillance program. It is not     intended to diagnose MRSA  infection nor to guide or     monitor treatment for     MRSA infections.  WOUND CULTURE     Status: None   Collection Time    10/13/12 11:03 AM      Result Value Range Status   Specimen Description LEG   Final   Special Requests Normal   Final   Gram Stain     Final   Value: NO WBC SEEN     NO SQUAMOUS EPITHELIAL CELLS SEEN     NO ORGANISMS SEEN   Culture     Final   Value: FEW STAPHYLOCOCCUS AUREUS     Note: RIFAMPIN AND GENTAMICIN SHOULD NOT BE USED AS SINGLE DRUGS FOR TREATMENT OF STAPH INFECTIONS.   Report Status 10/15/2012 FINAL   Final   Organism ID, Bacteria STAPHYLOCOCCUS AUREUS   Final  ANAEROBIC CULTURE     Status: None   Collection Time    10/13/12 11:04 AM      Result Value Range Status   Specimen Description LEG LT LOWER   Final   Special Requests Normal   Final   Gram Stain     Final   Value: NO WBC SEEN     NO SQUAMOUS EPITHELIAL CELLS SEEN     NO ORGANISMS SEEN   Culture     Final   Value: NO ANAEROBES ISOLATED; CULTURE IN PROGRESS FOR 5 DAYS   Report Status PENDING   Incomplete    Medical History: Past Medical History  Diagnosis Date  . Hypokalemia   . GERD (gastroesophageal reflux disease)   . Temporal arteritis   . Atrial  fibrillation   . Hypothyroidism   . Hyponatremia   . Angina   . Shortness of breath   . Hip fracture   . Pacemaker     Medications:  Anti-infectives   Start     Dose/Rate Route Frequency Ordered Stop   10/13/12 1000  vancomycin (VANCOCIN) 500 mg in sodium chloride 0.9 % 100 mL IVPB     500 mg 100 mL/hr over 60 Minutes Intravenous Every 24 hours 10/13/12 0925     10/13/12 1000  piperacillin-tazobactam (ZOSYN) IVPB 3.375 g     3.375 g 12.5 mL/hr over 240 Minutes Intravenous Every 8 hours 10/13/12 0925     10/13/12 0630  cefTRIAXone (ROCEPHIN) 1 g in dextrose 5 % 50 mL IVPB     1 g 100 mL/hr over 30 Minutes Intravenous  Once 10/13/12 0617 10/13/12 0844   10/13/12 0545  cefTRIAXone (ROCEPHIN) injection 1 g  Status:  Discontinued     1 g Intramuscular  Once 10/13/12 0541 10/13/12 0617   10/13/12 0545  azithromycin (ZITHROMAX) 500 mg in dextrose 5 % 250 mL IVPB     500 mg 250 mL/hr over 60 Minutes Intravenous  Once 10/13/12 0541 10/13/12 0816     Assessment: 79 yo F from SNF admit on 5/23 for fever, SIRS, cellulitis around LLE ulcer.  Pharmacy asked to assist with antibiotic dosing. Blood cultures reveal GPC in chains 1/2 and MSSA in wound culture. WBC has improved, SCr stable although UOP remains low despite fluid boluses 5/23-5/24 (MIVF at 126ml/h per RN documentation)  5/23 Azithromycin and ceftriaxone x1 5/23 >> Vancomycin >> 5/23 >> zosynI >>   Tmax: 103.5, now 99.9 WBCs: 6.6 Renal: SCr 0.76, CrCl ~ 29ml/min(CG), 21ml/min(N)  5/23 blood: GPC chains 5/23 Urine: NG 5/23: wound (leg): MSSA (vanco MIC=1)  Dose changes/drug level info:    Goal  of Therapy:  Vancomycin trough level 10-15 mcg/ml  Plan:   Continue Zosyn 3.375g IV Q8H infused over 4hrs.  Continue Vancomycin 500mg  IV q24h.  Check vancomycin trough Tuesday if continues (await final BC results, ? Strep vs. Enterococcus)  Adjust antibiotics pending final blood cx result  Follow up renal fxn and culture  results.   [name], PharmD, BCPS.   Pager: [phone] 10/15/2012 8:23 AM

## 2012-10-15 NOTE — Progress Notes (Signed)
CRITICAL VALUE ALERT  Critical value received:  Left wound culture positive for  Group streptococcus A.  Date of notification:  10-14-12  Time of notification:  0850  Critical value read back:  yes  Nurse who received alert:  Roney Jaffe, RN  MD notified (1st page):  Dr. Drusilla Kanner  Time of first page:  1010 am  MD notified (2nd page):  Time of second page:  Responding MD:    Time MD responded:

## 2012-10-15 NOTE — Progress Notes (Signed)
Dyspnea at rest has decreased. O2 sats remain in the mid 90's.  Emptied 1400 from the urinary catheter after lasix administered at 0835 am. Fine crackles remain in bilateral lung bases.  Scattered moderate rhonchi in other all other lung fields.  Patient reports she is only slightly shob of breath now. Will continue to monitor.

## 2012-10-15 NOTE — Progress Notes (Signed)
ANTICOAGULATION CONSULT NOTE - Follow-up Consult  Pharmacy Consult for warfarin Indication: atrial fibrillation  No Known Allergies  Patient Measurements: Height: 5\' 7"  (170.2 cm) Weight: 103 lb 6.3 oz (46.9 kg) IBW/kg (Calculated) : 61.6 Heparin Dosing Weight:   Vital Signs: Temp: 98.5 F (36.9 C) (05/25 0400) Temp src: Oral (05/25 0400) BP: 126/52 mmHg (05/25 0000) Pulse Rate: 85 (05/25 0000)  Labs:  Recent Labs  10/13/12 0520 10/14/12 0330 10/14/12 0529 10/14/12 0845 10/14/12 1546 10/14/12 2343 10/15/12 0403  HGB 14.9 7.8* 8.1* 9.5* 8.2* 8.2* 8.6*  HCT 43.5 24.0* 24.8* 28.7* 24.7* 25.1* 26.6*  PLT 145* 161 161  --   --   --  192  LABPROT 31.6*  --   --  31.3*  --   --  29.6*  INR 3.28*  --   --  3.24*  --   --  3.01*  CREATININE 0.83 0.88  --   --   --   --  0.76    Estimated Creatinine Clearance: 35.3 ml/min (by C-G formula based on Cr of 0.76).   Medical History: Past Medical History  Diagnosis Date  . Hypokalemia   . GERD (gastroesophageal reflux disease)   . Temporal arteritis   . Atrial fibrillation   . Hypothyroidism   . Hyponatremia   . Angina   . Shortness of breath   . Hip fracture   . Pacemaker     Assessment: 68 YOF admitted from SNF with fever and lethargy, She is on chronic warfarin therapy for chronic afib, warfarin held at admission for SUPRAtherapeutic INR (=3.28). Orders for pharmacy to resume warfarin dosing. Home dose: 3mg  daily   INR today = 3.01 (slowly trending down) - no doses of warfarin given during admission  CBC: Hgb 8.6 (stable), platelets OK   Goal of Therapy:  INR 2-3 Monitor platelets by anticoagulation protocol: Yes   Plan:   Continue to hold warfarin and check daily INR  I would typically give low dose warfarin to keep INR from dropping too much but suspect d/t age and acute illness that giving low-dose tonight will cause the INR to remain >3.   Monitor CBC and for bleeding  [name], PharmD,  BCPS.   Pager: [phone] 10/15/2012,8:18 AM

## 2012-10-15 NOTE — Progress Notes (Addendum)
[facility] PROGRESS NOTE  Janice [name] AVW:[id number] DOB: 06-18-1921 DOA: 10/13/2012 PCP: Thayer Headings, MD  Assessment/Plan:  SIRS - resolved, secondary to cellulitis. Will continue with vancomycin and zosyn.. Blood cultures and urine culture are pending.   Bacteremia 1/2 Blood culture is growing group a strep. Will continue with vancomycin , will get ID consult.  Dyspnea Secondary to pulmonary edema Will start lasix 20 mg q 12 hr  Cellulitis-Patient has infected left lower extremity ulcer and surrounding skin infection. Which most likely is the source of infection. Vancomycin and Zosyn  Atrial fibrillation-Patient will be monitored on telemetry  Will hold the beta blocker at this time due to hypotension  Pharmacy will manage the Coumadin   Protein calorie malnutrition -Patient only weighs 45.9 EKG  Will obtain nutrition consult   Left leg ulcer - wound culture is growing staph aureus, will continue with vancomycin.  Hypothyroidism -Continue Synthroid   Supratherapeutic INR -Patient's INR 3.28. We'll have pharmacy manage the Coumadin   Hypokalemia -Replaced potassium.  Anemia- ? Dilutional, H&h is stable.Stool occult blood is pending.  DVT prophylaxis -Patient is currently on full dose anticoagulation   Code Status: Full code, discussed with patient Family Communication: called and discussed with nephew in [place] Disposition Plan: SNF   Consultants:  None  Procedures:  None  Antibiotics:  Vancomycin 5/23>>  Zosyn 5/23>>>  HPI/Subjective: Patient seen and examined, has dyspnea.   Objective: Filed Vitals:   10/15/12 0000 10/15/12 0400 10/15/12 0800 10/15/12 0805  BP: 126/52   149/65  Pulse: 85   85  Temp: 99.9 F (37.7 C) 98.5 F (36.9 C) 97.7 F (36.5 C)   TempSrc: Rectal Oral Oral   Resp: 18   26  Height:      Weight:      SpO2: 95%   94%    Intake/Output Summary (Last 24 hours) at 10/15/12 1238 Last data filed at 10/15/12  1200  Gross per 24 hour  Intake   3130 ml  Output   1987 ml  Net   1143 ml   Filed Weights   10/13/12 0900  Weight: 46.9 kg (103 lb 6.3 oz)    Exam:   General: Alert, appear in no acute distress  Cardiovascular: s1s2 RRR  Respiratory: Bibasilar crackles  Abdomen: soft, nontender  Musculoskeletal: Dressing in place in both the legs, no edema, no erythema  Data Reviewed: Basic Metabolic Panel:  Recent Labs Lab 10/13/12 0520 10/14/12 0330 10/15/12 0403  NA 133* 136 136  K 3.3* 3.8 3.7  CL 96 108 109  CO2 27 21 20   GLUCOSE 140* 109* 99  BUN 26* 21 25*  CREATININE 0.83 0.88 0.76  CALCIUM 8.9 7.2* 7.9*   Liver Function Tests:  Recent Labs Lab 10/14/12 0330 10/15/12 0403  AST 21 22  ALT 12 12  ALKPHOS 64 67  BILITOT 0.3 0.2*  PROT 4.6* 5.1*  ALBUMIN 1.9* 2.1*   CBC:  Recent Labs Lab 10/13/12 0520 10/14/12 0330 10/14/12 0529 10/14/12 0845 10/14/12 1546 10/14/12 2343 10/15/12 0403  WBC 9.3 13.8* 12.6*  --   --   --  6.6  NEUTROABS 8.9*  --   --   --   --   --   --   HGB 14.9 7.8* 8.1* 9.5* 8.2* 8.2* 8.6*  HCT 43.5 24.0* 24.8* 28.7* 24.7* 25.1* 26.6*  MCV 89.0 91.6 91.9  --   --   --  92.7  PLT 145* 161 161  --   --   --  192   Cardiac Enzymes: No results found for this basename: CKTOTAL, CKMB, CKMBINDEX, TROPONINI,  in the last 168 hours BNP (last 3 results) No results found for this basename: PROBNP,  in the last 8760 hours CBG: No results found for this basename: GLUCAP,  in the last 168 hours  Recent Results (from the past 240 hour(s))  URINE CULTURE     Status: None   Collection Time    10/13/12  4:58 AM      Result Value Range Status   Specimen Description URINE, CATHETERIZED   Final   Special Requests Normal   Final   Culture  Setup Time 10/13/2012 14:48   Final   Colony Count NO GROWTH   Final   Culture NO GROWTH   Final   Report Status 10/14/2012 FINAL   Final  CULTURE, BLOOD (ROUTINE X 2)     Status: None   Collection Time     10/13/12  5:20 AM      Result Value Range Status   Specimen Description BLOOD LEFT ANTECUBITAL   Final   Special Requests BOTTLES DRAWN AEROBIC AND ANAEROBIC   Final   Culture  Setup Time 10/13/2012 09:33   Final   Culture     Final   Value: GROUP A STREP (S.PYOGENES) ISOLATED     Note: CRITICAL RESULT CALLED TO, READ BACK BY AND VERIFIED WITH: ANGELA ASHLEY 10/15/12 @ 8:47AM BY RUSCA.     Note: Gram Stain Report Called to,Read Back By and Verified With: JENNEL D AT 0352 10/14/12 BY LSNOW   Report Status 10/15/2012 FINAL   Final  CULTURE, BLOOD (ROUTINE X 2)     Status: None   Collection Time    10/13/12  6:05 AM      Result Value Range Status   Specimen Description BLOOD RIGHT FOREARM   Final   Special Requests BOTTLES DRAWN AEROBIC AND ANAEROBIC   Final   Culture  Setup Time 10/13/2012 09:35   Final   Culture     Final   Value:        BLOOD CULTURE RECEIVED NO GROWTH TO DATE CULTURE WILL BE HELD FOR 5 DAYS BEFORE ISSUING A FINAL NEGATIVE REPORT   Report Status PENDING   Incomplete  MRSA PCR SCREENING     Status: None   Collection Time    10/13/12  9:07 AM      Result Value Range Status   MRSA by PCR NEGATIVE  NEGATIVE Final   Comment:            The GeneXpert MRSA Assay (FDA     approved for NASAL specimens     only), is one component of a     comprehensive MRSA colonization     surveillance program. It is not     intended to diagnose MRSA     infection nor to guide or     monitor treatment for     MRSA infections.  WOUND CULTURE     Status: None   Collection Time    10/13/12 11:03 AM      Result Value Range Status   Specimen Description LEG   Final   Special Requests Normal   Final   Gram Stain     Final   Value: NO WBC SEEN     NO SQUAMOUS EPITHELIAL CELLS SEEN     NO ORGANISMS SEEN   Culture     Final   Value: FEW  STAPHYLOCOCCUS AUREUS     Note: RIFAMPIN AND GENTAMICIN SHOULD NOT BE USED AS SINGLE DRUGS FOR TREATMENT OF STAPH INFECTIONS.   Report Status  10/15/2012 FINAL   Final   Organism ID, Bacteria STAPHYLOCOCCUS AUREUS   Final  ANAEROBIC CULTURE     Status: None   Collection Time    10/13/12 11:04 AM      Result Value Range Status   Specimen Description LEG LT LOWER   Final   Special Requests Normal   Final   Gram Stain     Final   Value: NO WBC SEEN     NO SQUAMOUS EPITHELIAL CELLS SEEN     NO ORGANISMS SEEN   Culture     Final   Value: NO ANAEROBES ISOLATED; CULTURE IN PROGRESS FOR 5 DAYS   Report Status PENDING   Incomplete     Studies: No results found.  Scheduled Meds: . feeding supplement  237 mL Oral BID BM  . feeding supplement  30 mL Oral BID PC  . levothyroxine  50 mcg Oral QAC breakfast  . multivitamin with minerals  1 tablet Oral Daily  . piperacillin-tazobactam (ZOSYN)  IV  3.375 g Intravenous Q8H  . polyvinyl alcohol  1 drop Both Eyes Q2H  . vancomycin  500 mg Intravenous Q24H  . Warfarin - Pharmacist Dosing Inpatient   Does not apply q1800   Continuous Infusions: . sodium chloride 20 mL/hr (10/15/12 0811)    Active Problems:   Atrial fibrillation   Hypothyroidism   Cellulitis    Time spent: 35 min    [facility]  [facility] Pager [phone] If 7PM-7AM, please contact night-coverage at [email or web address], password [facility] 10/15/2012, 12:38 PM  LOS: 2 days

## 2012-10-15 NOTE — Progress Notes (Signed)
Patient has audible, expiratory wheezes with retractions and dyspnea.  Decreased IV fluids from 100 ml and hour to kvo.  Dr. Drusilla Kanner arrived and ordered lasix.

## 2012-10-15 NOTE — Clinical Social Work Psychosocial (Signed)
Clinical Social Work Department  BRIEF PSYCHOSOCIAL ASSESSMENT  Patient: Janice [name] Account Number: [id number]   Admit date: 10/13/12 Clinical Social Worker Sabino Niemann, MSW Date/Time: 10/15/2012 3:00 PM  Referred by: Physician Date Referred: 10/13/12 Referred for   From ALF  Other Referral:  Interview type: Patient  Other interview type: PSYCHOSOCIAL DATA  Living Status: Patient lives at ALF Admitted from facility: [facility] Level of care: ALF Primary support name: Lineberger,David Primary support relationship to patient: Nephew Degree of support available:  Strong and vested  CURRENT CONCERNS  Current Concerns   Return to [facility]  Other Concerns:  SOCIAL WORK ASSESSMENT / PLAN  CSW met with pt re: No PT recommendations at this time.   Pt lives at [facility] and is eager to return there. There is no PT evaluation but patient does not want to go to a SNF.       Pt is agreeable to returning to [facility] and stated 'they have a great PT program, I don't need SNF"   CSW completed FL2 and sent clinicals to [facility]     Assessment/plan status: Information/Referral to [facility]  Other assessment/ plan:  Information/referral to community resources:  SNF     PATIENT'S/FAMILY'S RESPONSE TO PLAN OF CARE:  Pt  reports she is agreeable to retuning to [facility] and not SNF. Weekday SW will f/u with patient after PT makes recommendation.  Pt verbalized appreciation for CSW assist.   Sabino Niemann, MSW [phone]

## 2012-10-16 DIAGNOSIS — I509 Heart failure, unspecified: Secondary | ICD-10-CM

## 2012-10-16 LAB — BASIC METABOLIC PANEL
BUN: 20 mg/dL (ref 6–23)
CO2: 23 mEq/L (ref 19–32)
Calcium: 8 mg/dL — ABNORMAL LOW (ref 8.4–10.5)
Chloride: 104 mEq/L (ref 96–112)
Creatinine, Ser: 0.72 mg/dL (ref 0.50–1.10)
Glucose, Bld: 96 mg/dL (ref 70–99)

## 2012-10-16 MED ORDER — POTASSIUM CHLORIDE CRYS ER 20 MEQ PO TBCR
40.0000 meq | EXTENDED_RELEASE_TABLET | ORAL | Status: AC
  Administered 2012-10-16 (×3): 40 meq via ORAL
  Filled 2012-10-16: qty 1
  Filled 2012-10-16 (×2): qty 2
  Filled 2012-10-16: qty 1

## 2012-10-16 MED ORDER — WARFARIN SODIUM 3 MG PO TABS
3.0000 mg | ORAL_TABLET | Freq: Once | ORAL | Status: AC
  Administered 2012-10-16: 3 mg via ORAL
  Filled 2012-10-16: qty 1

## 2012-10-16 NOTE — Progress Notes (Signed)
ANTICOAGULATION CONSULT NOTE - Follow-up Consult  Pharmacy Consult for warfarin Indication: atrial fibrillation  No Known Allergies  Patient Measurements: Height: 5\' 7"  (170.2 cm) Weight: 103 lb 6.3 oz (46.9 kg) IBW/kg (Calculated) : 61.6  Vital Signs: Temp: 98.4 F (36.9 C) (05/26 0805) Temp src: Oral (05/26 0805) BP: 128/55 mmHg (05/26 0800) Pulse Rate: 85 (05/26 0800)  Labs:  Recent Labs  10/14/12 0330 10/14/12 0529 10/14/12 0845 10/14/12 1546 10/14/12 2343 10/15/12 0403 10/16/12 0344  HGB 7.8* 8.1* 9.5* 8.2* 8.2* 8.6*  --   HCT 24.0* 24.8* 28.7* 24.7* 25.1* 26.6*  --   PLT 161 161  --   --   --  192  --   LABPROT  --   --  31.3*  --   --  29.6* 19.2*  INR  --   --  3.24*  --   --  3.01* 1.68*  CREATININE 0.88  --   --   --   --  0.76 0.72    Estimated Creatinine Clearance: 35.3 ml/min (by C-G formula based on Cr of 0.72).  Assessment: 25 YOF admitted from SNF with fever and lethargy, She is on chronic warfarin therapy for chronic afib, warfarin held at admission for SUPRAtherapeutic INR (3.28). Orders for pharmacy to resume warfarin dosing.  Home dose: 3mg  daily, last dose given 5/22  INR today decreased to subtherapeutic (1.68) after holding x3 days.  CBC: Hgb 8.6 (yesterday, stable), platelets 192.  FOB negative and no bleeding reported.  Goal of Therapy:  INR 2-3 Monitor platelets by anticoagulation protocol: Yes   Plan:   Warfarin 3mg  PO tonight  Monitor CBC and INR daily   Lynann Beaver PharmD, BCPS Pager [phone] 10/16/2012 9:11 AM

## 2012-10-16 NOTE — Progress Notes (Signed)
[facility] PROGRESS NOTE  Janice [name] ZOX:[id number] DOB: 1922-04-27 DOA: 10/13/2012 PCP: Thayer Headings, MD  Assessment/Plan:  SIRS - resolved, secondary to cellulitis. Will continue with vancomycin and d/c zosyn.. Blood cultures growing 1/2 bottles group a  Strep. Urine culture is negative, wound culture grew Staph aureus.  Bacteremia 1/2 Blood culture is growing group a strep. Will continue with vancomycin.  Dyspnea Improved with diuresis, output>3063ml Secondary to pulmonary edema Will start lasix 20 mg q 12 hr  Cellulitis-Patient has infected left lower extremity ulcer and surrounding skin infection. Which most likely is the source of infection. Vancomycin   Atrial fibrillation-Patient will be monitored on telemetry  Will hold the beta blocker at this time due to hypotension  Pharmacy will manage the Coumadin   Protein calorie malnutrition -Patient only weighs 45.9 EKG  Will obtain nutrition consult   Left leg ulcer - wound culture is growing staph aureus, will continue with vancomycin.  Hypothyroidism -Continue Synthroid   Supratherapeutic INR -Patient's INR 3.28. We'll have pharmacy manage the Coumadin   Hypokalemia -Replace potassium.  Anemia- ? Dilutional, H&h is stable.Stool occult blood is negative.  DVT prophylaxis -Patient is currently on full dose anticoagulation   Code Status: Full code, discussed with patient Family Communication: called and discussed with nephew in [place] Disposition Plan: SNF   Consultants:  None  Procedures:  None  Antibiotics:  Vancomycin 5/23>>  Zosyn 5/23>>>  HPI/Subjective: Patient seen and examined, her dysnea has improved. Objective: Filed Vitals:   10/16/12 0400 10/16/12 0700 10/16/12 0800 10/16/12 0805  BP:   128/55   Pulse:  85 85   Temp: 98.2 F (36.8 C)   98.4 F (36.9 C)  TempSrc: Oral   Oral  Resp:  22 18   Height:      Weight:      SpO2:  92% 92%     Intake/Output Summary (Last  24 hours) at 10/16/12 0943 Last data filed at 10/16/12 0800  Gross per 24 hour  Intake    455 ml  Output   3853 ml  Net  -3398 ml   Filed Weights   10/13/12 0900  Weight: 46.9 kg (103 lb 6.3 oz)    Exam:   General: Alert, appear in no acute distress  Cardiovascular: s1s2 RRR  Respiratory: Bibasilar crackles  Abdomen: soft, nontender  Musculoskeletal: Dressing in place in both the legs, no edema, no erythema  Data Reviewed: Basic Metabolic Panel:  Recent Labs Lab 10/13/12 0520 10/14/12 0330 10/15/12 0403 10/16/12 0344  NA 133* 136 136 135  K 3.3* 3.8 3.7 3.0*  CL 96 108 109 104  CO2 27 21 20 23   GLUCOSE 140* 109* 99 96  BUN 26* 21 25* 20  CREATININE 0.83 0.88 0.76 0.72  CALCIUM 8.9 7.2* 7.9* 8.0*   Liver Function Tests:  Recent Labs Lab 10/14/12 0330 10/15/12 0403  AST 21 22  ALT 12 12  ALKPHOS 64 67  BILITOT 0.3 0.2*  PROT 4.6* 5.1*  ALBUMIN 1.9* 2.1*   CBC:  Recent Labs Lab 10/13/12 0520 10/14/12 0330 10/14/12 0529 10/14/12 0845 10/14/12 1546 10/14/12 2343 10/15/12 0403  WBC 9.3 13.8* 12.6*  --   --   --  6.6  NEUTROABS 8.9*  --   --   --   --   --   --   HGB 14.9 7.8* 8.1* 9.5* 8.2* 8.2* 8.6*  HCT 43.5 24.0* 24.8* 28.7* 24.7* 25.1* 26.6*  MCV 89.0 91.6 91.9  --   --   --  92.7  PLT 145* 161 161  --   --   --  192   Cardiac Enzymes: No results found for this basename: CKTOTAL, CKMB, CKMBINDEX, TROPONINI,  in the last 168 hours BNP (last 3 results) No results found for this basename: PROBNP,  in the last 8760 hours CBG: No results found for this basename: GLUCAP,  in the last 168 hours  Recent Results (from the past 240 hour(s))  URINE CULTURE     Status: None   Collection Time    10/13/12  4:58 AM      Result Value Range Status   Specimen Description URINE, CATHETERIZED   Final   Special Requests Normal   Final   Culture  Setup Time 10/13/2012 14:48   Final   Colony Count NO GROWTH   Final   Culture NO GROWTH   Final   Report  Status 10/14/2012 FINAL   Final  CULTURE, BLOOD (ROUTINE X 2)     Status: None   Collection Time    10/13/12  5:20 AM      Result Value Range Status   Specimen Description BLOOD LEFT ANTECUBITAL   Final   Special Requests BOTTLES DRAWN AEROBIC AND ANAEROBIC   Final   Culture  Setup Time 10/13/2012 09:33   Final   Culture     Final   Value: GROUP A STREP (S.PYOGENES) ISOLATED     Note: CRITICAL RESULT CALLED TO, READ BACK BY AND VERIFIED WITH: ANGELA ASHLEY 10/15/12 @ 8:47AM BY RUSCA.     Note: Gram Stain Report Called to,Read Back By and Verified With: JENNEL D AT 0352 10/14/12 BY LSNOW   Report Status 10/15/2012 FINAL   Final  CULTURE, BLOOD (ROUTINE X 2)     Status: None   Collection Time    10/13/12  6:05 AM      Result Value Range Status   Specimen Description BLOOD RIGHT FOREARM   Final   Special Requests BOTTLES DRAWN AEROBIC AND ANAEROBIC   Final   Culture  Setup Time 10/13/2012 09:35   Final   Culture     Final   Value:        BLOOD CULTURE RECEIVED NO GROWTH TO DATE CULTURE WILL BE HELD FOR 5 DAYS BEFORE ISSUING A FINAL NEGATIVE REPORT   Report Status PENDING   Incomplete  MRSA PCR SCREENING     Status: None   Collection Time    10/13/12  9:07 AM      Result Value Range Status   MRSA by PCR NEGATIVE  NEGATIVE Final   Comment:            The GeneXpert MRSA Assay (FDA     approved for NASAL specimens     only), is one component of a     comprehensive MRSA colonization     surveillance program. It is not     intended to diagnose MRSA     infection nor to guide or     monitor treatment for     MRSA infections.  WOUND CULTURE     Status: None   Collection Time    10/13/12 11:03 AM      Result Value Range Status   Specimen Description LEG   Final   Special Requests Normal   Final   Gram Stain     Final   Value: NO WBC SEEN     NO SQUAMOUS EPITHELIAL CELLS SEEN  NO ORGANISMS SEEN   Culture     Final   Value: FEW STAPHYLOCOCCUS AUREUS     Note: RIFAMPIN AND  GENTAMICIN SHOULD NOT BE USED AS SINGLE DRUGS FOR TREATMENT OF STAPH INFECTIONS.   Report Status 10/15/2012 FINAL   Final   Organism ID, Bacteria STAPHYLOCOCCUS AUREUS   Final  ANAEROBIC CULTURE     Status: None   Collection Time    10/13/12 11:04 AM      Result Value Range Status   Specimen Description LEG LT LOWER   Final   Special Requests Normal   Final   Gram Stain     Final   Value: NO WBC SEEN     NO SQUAMOUS EPITHELIAL CELLS SEEN     NO ORGANISMS SEEN   Culture     Final   Value: NO ANAEROBES ISOLATED; CULTURE IN PROGRESS FOR 5 DAYS   Report Status PENDING   Incomplete     Studies: No results found.  Scheduled Meds: . feeding supplement  237 mL Oral BID BM  . feeding supplement  30 mL Oral BID PC  . furosemide  20 mg Intravenous BID  . levothyroxine  50 mcg Oral QAC breakfast  . multivitamin with minerals  1 tablet Oral Daily  . polyvinyl alcohol  1 drop Both Eyes Q2H  . potassium chloride  40 mEq Oral Q4H  . vancomycin  500 mg Intravenous Q24H  . warfarin  3 mg Oral ONCE-1800  . Warfarin - Pharmacist Dosing Inpatient   Does not apply q1800   Continuous Infusions: . sodium chloride 20 mL/hr (10/15/12 0811)    Active Problems:   Atrial fibrillation   Hypothyroidism   Cellulitis    Time spent: 35 min    [facility] S  [facility] Pager [phone] If 7PM-7AM, please contact night-coverage at [email or web address], password [facility] 10/16/2012, 9:43 AM  LOS: 3 days

## 2012-10-17 LAB — BASIC METABOLIC PANEL
BUN: 20 mg/dL (ref 6–23)
CO2: 26 mEq/L (ref 19–32)
Calcium: 8.8 mg/dL (ref 8.4–10.5)
Chloride: 103 mEq/L (ref 96–112)
Creatinine, Ser: 0.71 mg/dL (ref 0.50–1.10)
Glucose, Bld: 90 mg/dL (ref 70–99)

## 2012-10-17 LAB — CBC
HCT: 30.3 % — ABNORMAL LOW (ref 36.0–46.0)
Hemoglobin: 10.1 g/dL — ABNORMAL LOW (ref 12.0–15.0)
MCH: 30.5 pg (ref 26.0–34.0)
MCV: 91.5 fL (ref 78.0–100.0)
Platelets: 204 10*3/uL (ref 150–400)
RBC: 3.31 MIL/uL — ABNORMAL LOW (ref 3.87–5.11)
WBC: 5.6 10*3/uL (ref 4.0–10.5)

## 2012-10-17 LAB — PROTIME-INR: Prothrombin Time: 16.6 seconds — ABNORMAL HIGH (ref 11.6–15.2)

## 2012-10-17 MED ORDER — ENOXAPARIN SODIUM 80 MG/0.8ML ~~LOC~~ SOLN
1.5000 mg/kg | SUBCUTANEOUS | Status: DC
  Administered 2012-10-17 – 2012-10-18 (×2): 70 mg via SUBCUTANEOUS
  Filled 2012-10-17 (×2): qty 0.8

## 2012-10-17 MED ORDER — WARFARIN SODIUM 3 MG PO TABS
3.0000 mg | ORAL_TABLET | Freq: Once | ORAL | Status: AC
  Administered 2012-10-17: 3 mg via ORAL
  Filled 2012-10-17: qty 1

## 2012-10-17 MED ORDER — CEPHALEXIN 500 MG PO CAPS
500.0000 mg | ORAL_CAPSULE | Freq: Two times a day (BID) | ORAL | Status: DC
  Administered 2012-10-17 – 2012-10-18 (×2): 500 mg via ORAL
  Filled 2012-10-17 (×3): qty 1

## 2012-10-17 NOTE — Progress Notes (Signed)
Patient's HR has been spiking up to the 140s-150s occasionally throughout the shift but never sustained. Patient's vitals taken and BP was 102/49. Patient stated that she was in no pain and did not feel short of breath. [facility] NP made aware and said to notify her if extreme tachy is sustained. Will continue to monitor Janice [name], Janice [name]

## 2012-10-17 NOTE — Progress Notes (Signed)
[facility] PROGRESS NOTE  Janice [name]:[id number] DOB: 09-01-1921 DOA: 10/13/2012 PCP: Thayer Headings, MD  Interval history 79 year old female who was brought from the assisted living facility with lethargy and fever. Patient was found to have SIRS due to cellulitis. Patient was started on antibiotics vancomycin and Zosyn. Blood cultures grew  group A strep in one out of 2 bottles, and her wound culture grew  MSSA. Patient received aggressive IV fluid in the ICU and did not require pressors. At this time patient is stable, sepsis is resolved. She will require 10 more days of Keflex which should cover both group a strep and MSSA, as per ID recommendations. Called and discussed with Dr. Luciana Axe. Patient at this time has subtherapeutic INR and is requiring bridging with Lovenox, once the INR is therapeutic patient can be discharged back to assisted living.   Assessment/Plan:  SIRS - resolved, secondary to cellulitis. Will continue with vancomycin and d/c zosyn.. Blood cultures growing 1/2 bottles group a  Strep. Urine culture is negative, wound culture grew Staph aureus.  Bacteremia 1/2 Blood culture is growing group a strep. Will discontinue vancomycin and start Keflex  Dyspnea Improved with diuresis, output>3058ml Secondary to pulmonary edema Will discontinue Lasix  Cellulitis-Patient has infected left lower extremity ulcer and surrounding skin infection. Which most likely is the source of infection. Continue Keflex  Atrial fibrillation-Patient will be monitored on telemetry  Will restart biastolic  Pharmacy will manage the Coumadin  Patient monitoring Lovenox bridging as INR is subtherapeutic  Protein calorie malnutrition -Patient only weighs 45.9 EKG  Continue Prostat supplement  Left leg ulcer - wound culture is growing staph aureus, will continue with Keflex  Hypothyroidism -Continue Synthroid    Anemia- ? Dilutional, H&h is stable.Stool occult blood is  negative.  DVT prophylaxis -Patient is currently on full dose anticoagulation   Code Status: Full code, discussed with patient Family Communication: called and discussed with nephew in [place] Disposition Plan: SNF   Consultants:  None  Procedures:  None  Antibiotics:  Vancomycin 5/23>>5/27  Zosyn 5/23>>>5/26  Keflex 5/27>>  HPI/Subjective: Patient seen and examined, her dysnea has improved. She is now off oxygen.  Objective: Filed Vitals:   10/16/12 2021 10/17/12 0032 10/17/12 0507 10/17/12 1300  BP: 102/49 128/55 125/59 123/55  Pulse: 89 84 69 67  Temp: 98.6 F (37 C) 97.9 F (36.6 C) 98.1 F (36.7 C) 98.7 F (37.1 C)  TempSrc: Oral Axillary Oral Oral  Resp: 22 18 18 16   Height:      Weight:      SpO2: 98% 100% 100% 100%    Intake/Output Summary (Last 24 hours) at 10/17/12 1835 Last data filed at 10/17/12 1500  Gross per 24 hour  Intake    170 ml  Output   2900 ml  Net  -2730 ml   Filed Weights   10/13/12 0900  Weight: 46.9 kg (103 lb 6.3 oz)    Exam:   General: Alert, appear in no acute distress  Cardiovascular: s1s2 RRR  Respiratory: Bibasilar crackles  Abdomen: soft, nontender  Musculoskeletal: Dressing in place in both the legs, no edema, no erythema  Data Reviewed: Basic Metabolic Panel:  Recent Labs Lab 10/13/12 0520 10/14/12 0330 10/15/12 0403 10/16/12 0344 10/17/12 0500  NA 133* 136 136 135 134*  K 3.3* 3.8 3.7 3.0* 4.6  CL 96 108 109 104 103  CO2 27 21 20 23 26   GLUCOSE 140* 109* 99 96 90  BUN 26* 21 25* 20 20  CREATININE 0.83 0.88 0.76 0.72 0.71  CALCIUM 8.9 7.2* 7.9* 8.0* 8.8   Liver Function Tests:  Recent Labs Lab 10/14/12 0330 10/15/12 0403  AST 21 22  ALT 12 12  ALKPHOS 64 67  BILITOT 0.3 0.2*  PROT 4.6* 5.1*  ALBUMIN 1.9* 2.1*   CBC:  Recent Labs Lab 10/13/12 0520 10/14/12 0330 10/14/12 0529 10/14/12 0845 10/14/12 1546 10/14/12 2343 10/15/12 0403 10/17/12 0500  WBC 9.3 13.8* 12.6*   --   --   --  6.6 5.6  NEUTROABS 8.9*  --   --   --   --   --   --   --   HGB 14.9 7.8* 8.1* 9.5* 8.2* 8.2* 8.6* 10.1*  HCT 43.5 24.0* 24.8* 28.7* 24.7* 25.1* 26.6* 30.3*  MCV 89.0 91.6 91.9  --   --   --  92.7 91.5  PLT 145* 161 161  --   --   --  192 204   Cardiac Enzymes: No results found for this basename: CKTOTAL, CKMB, CKMBINDEX, TROPONINI,  in the last 168 hours BNP (last 3 results) No results found for this basename: PROBNP,  in the last 8760 hours CBG: No results found for this basename: GLUCAP,  in the last 168 hours  Recent Results (from the past 240 hour(s))  URINE CULTURE     Status: None   Collection Time    10/13/12  4:58 AM      Result Value Range Status   Specimen Description URINE, CATHETERIZED   Final   Special Requests Normal   Final   Culture  Setup Time 10/13/2012 14:48   Final   Colony Count NO GROWTH   Final   Culture NO GROWTH   Final   Report Status 10/14/2012 FINAL   Final  CULTURE, BLOOD (ROUTINE X 2)     Status: None   Collection Time    10/13/12  5:20 AM      Result Value Range Status   Specimen Description BLOOD LEFT ANTECUBITAL   Final   Special Requests BOTTLES DRAWN AEROBIC AND ANAEROBIC   Final   Culture  Setup Time 10/13/2012 09:33   Final   Culture     Final   Value: GROUP A STREP (S.PYOGENES) ISOLATED     Note: CRITICAL RESULT CALLED TO, READ BACK BY AND VERIFIED WITH: ANGELA ASHLEY 10/15/12 @ 8:47AM BY RUSCA.     Note: Gram Stain Report Called to,Read Back By and Verified With: JENNEL D AT 0352 10/14/12 BY LSNOW   Report Status 10/15/2012 FINAL   Final  CULTURE, BLOOD (ROUTINE X 2)     Status: None   Collection Time    10/13/12  6:05 AM      Result Value Range Status   Specimen Description BLOOD RIGHT FOREARM   Final   Special Requests BOTTLES DRAWN AEROBIC AND ANAEROBIC   Final   Culture  Setup Time 10/13/2012 09:35   Final   Culture     Final   Value:        BLOOD CULTURE RECEIVED NO GROWTH TO DATE CULTURE WILL BE HELD FOR 5  DAYS BEFORE ISSUING A FINAL NEGATIVE REPORT   Report Status PENDING   Incomplete  MRSA PCR SCREENING     Status: None   Collection Time    10/13/12  9:07 AM      Result Value Range Status   MRSA by PCR NEGATIVE  NEGATIVE Final   Comment:  The GeneXpert MRSA Assay (FDA     approved for NASAL specimens     only), is one component of a     comprehensive MRSA colonization     surveillance program. It is not     intended to diagnose MRSA     infection nor to guide or     monitor treatment for     MRSA infections.  WOUND CULTURE     Status: None   Collection Time    10/13/12 11:03 AM      Result Value Range Status   Specimen Description LEG   Final   Special Requests Normal   Final   Gram Stain     Final   Value: NO WBC SEEN     NO SQUAMOUS EPITHELIAL CELLS SEEN     NO ORGANISMS SEEN   Culture     Final   Value: FEW STAPHYLOCOCCUS AUREUS     Note: RIFAMPIN AND GENTAMICIN SHOULD NOT BE USED AS SINGLE DRUGS FOR TREATMENT OF STAPH INFECTIONS.   Report Status 10/15/2012 FINAL   Final   Organism ID, Bacteria STAPHYLOCOCCUS AUREUS   Final  ANAEROBIC CULTURE     Status: None   Collection Time    10/13/12 11:04 AM      Result Value Range Status   Specimen Description LEG LT LOWER   Final   Special Requests Normal   Final   Gram Stain     Final   Value: NO WBC SEEN     NO SQUAMOUS EPITHELIAL CELLS SEEN     NO ORGANISMS SEEN   Culture     Final   Value: NO ANAEROBES ISOLATED; CULTURE IN PROGRESS FOR 5 DAYS   Report Status PENDING   Incomplete     Studies: No results found.  Scheduled Meds: . enoxaparin (LOVENOX) injection  1.5 mg/kg Subcutaneous Q24H  . feeding supplement  30 mL Oral BID PC  . furosemide  20 mg Intravenous BID  . levothyroxine  50 mcg Oral QAC breakfast  . multivitamin with minerals  1 tablet Oral Daily  . polyvinyl alcohol  1 drop Both Eyes Q2H  . vancomycin  500 mg Intravenous Q24H  . Warfarin - Pharmacist Dosing Inpatient   Does not apply q1800    Continuous Infusions: . sodium chloride 10 mL/hr (10/17/12 1231)    Active Problems:   Atrial fibrillation   Hypothyroidism   Cellulitis    Time spent: 35 min    Jakorian Marengo S  [facility] Pager [phone] If 7PM-7AM, please contact night-coverage at [email or web address], password [facility] 10/17/2012, 6:35 PM  LOS: 4 days

## 2012-10-17 NOTE — Evaluation (Signed)
Physical Therapy Evaluation Patient Details Name: Janice [name] MRN: [id number] DOB: 07-22-1921 Today's Date: 10/17/2012 Time: 4098-1191 PT Time Calculation (min): 34 min  PT Assessment / Plan / Recommendation Clinical Impression  Pt is 79 yo female admitted 10/13/12 for fever, hypotension. Pt resides at [facility] and reports she has assistance with ADL's, meals, is tKEN TO DINING ROOM, USES wc MOST OF THE TIMEAPT. that she does ambulate short distances and has had several falls since moving there 2 months ago. Pt did ambulate 10 ft with min assist on eval. Recommend that pt have 24/7 caregivers. Pt satates that that can be arranged. RN aware. continue PT while in acute care. Recommend HHPT./ PT  while at  [facility].    PT Assessment  Patient needs continued PT services    Follow Up Recommendations  Home health PT (pt wants to return to apt.)    Does the patient have the potential to tolerate intense rehabilitation      Barriers to Discharge        Equipment Recommendations  None recommended by PT    Recommendations for Other Services     Frequency Min 3X/week    Precautions / Restrictions Precautions Precautions: Fall   Pertinent Vitals/Pain sats 96 %, BP 122/48 after ambulation.      Mobility  Bed Mobility Bed Mobility: Supine to Sit Supine to Sit: 4: Min assist Transfers Transfers: Sit to Stand;Stand to Sit Sit to Stand: 4: Min assist;From bed;With upper extremity assist Ambulation/Gait Ambulation/Gait Assistance: 4: Min assist Ambulation Distance (Feet): 10 Feet Assistive device: Rolling walker Ambulation/Gait Assistance Details: pt actually did well with walking once in standing.  Gait Pattern: Step-through pattern;Trunk flexed Gait velocity: slow    Exercises     PT Diagnosis: Generalized weakness  PT Problem List: Decreased strength;Decreased activity tolerance;Decreased balance;Decreased mobility;Decreased safety awareness;Decreased knowledge of  precautions PT Treatment Interventions: DME instruction;Gait training;Functional mobility training;Therapeutic activities;Therapeutic exercise;Patient/family education   PT Goals Acute Rehab PT Goals PT Goal Formulation: With patient Time For Goal Achievement: 10/31/12 Potential to Achieve Goals: Good Pt will go Supine/Side to Sit: Independently PT Goal: Supine/Side to Sit - Progress: Goal set today Pt will go Sit to Supine/Side: Independently PT Goal: Sit to Supine/Side - Progress: Goal set today Pt will go Sit to Stand: with modified independence PT Goal: Sit to Stand - Progress: Goal set today Pt will go Stand to Sit: with modified independence PT Goal: Stand to Sit - Progress: Goal set today Pt will Transfer Bed to Chair/Chair to Bed: with modified independence PT Transfer Goal: Bed to Chair/Chair to Bed - Progress: Goal set today Pt will Ambulate: 16 - 50 feet;with supervision;with rolling walker PT Goal: Ambulate - Progress: Goal set today Pt will Propel Wheelchair: 51 - 150 feet;with modified independence PT Goal: Propel Wheelchair - Progress: Goal set today  Visit Information  Last PT Received On: 10/17/12 Assistance Needed: +1    Subjective Data  Subjective: I am ready to go home Patient Stated Goal: return to my apt.   Prior Functioning  Home Living Lives With: Alone Available Help at Discharge: Personal care attendant Type of Home: Apartment Home Access: Level entry Home Layout: One level Home Adaptive Equipment: Walker - rolling;Wheelchair - manual Prior Function Level of Independence: Needs assistance Needs Assistance: Bathing;Dressing;Grooming;Toileting;Meal Prep Bath: Minimal Dressing: Minimal Grooming: Minimal Toileting: Minimal Meal Prep: Minimal Comments: Pt reports that at [facility]: she calls for assistance to get up to go to bathroom, attendents come in the AM  to help her get OOB, bathed, dressed, meals. Pt does walk a short distance on her on  when up  during the day. Pt has had several falls. Communication Communication: No difficulties    Cognition  Cognition Arousal/Alertness: Awake/alert Behavior During Therapy: WFL for tasks assessed/performed Overall Cognitive Status: Within Functional Limits for tasks assessed    Extremity/Trunk Assessment Right Upper Extremity Assessment RUE ROM/Strength/Tone: WFL for tasks assessed Left Upper Extremity Assessment LUE ROM/Strength/Tone: WFL for tasks assessed Right Lower Extremity Assessment RLE ROM/Strength/Tone: Deficits RLE ROM/Strength/Tone Deficits: decreased knee flexion to about 70 degrees. WFL for weight bearing to walk. Left Lower Extremity Assessment LLE ROM/Strength/Tone: [facility] for tasks assessed   Balance Balance Balance Assessed: Yes Static Sitting Balance Static Sitting - Balance Support: No upper extremity supported Static Sitting - Level of Assistance: 5: Stand by assistance  End of Session PT - End of Session Activity Tolerance: Patient tolerated treatment well Patient left: in chair;with call bell/phone within reach;with chair alarm set;with family/visitor present Nurse Communication: Mobility status  GP     Rada Hay 10/17/2012, 2:27 PM Blanchard Kelch PT [phone]

## 2012-10-17 NOTE — Progress Notes (Signed)
CSW spoke with patient & nephew, Onalee Hua (ph#: [phone]) re: discharge planning. Patient was admitted from [facility] (with additional care services), and patient plans to return to her Independent apartment. CSW spoke with Armando Reichert @ [facility] who gave the ok for patient to return. Patient states she has a friend that will transport her back to the facility tomorrow.   Unice Bailey, LCSW [facility] Clinical Social Worker cell #: [phone]

## 2012-10-17 NOTE — Progress Notes (Signed)
ANTICOAGULATION CONSULT NOTE - Follow-up Consult  Pharmacy Consult for warfarin/lovenox Indication: atrial fibrillation  No Known Allergies  Patient Measurements: Height: 5\' 7"  (170.2 cm) Weight: 103 lb 6.3 oz (46.9 kg) IBW/kg (Calculated) : 61.6  Vital Signs: Temp: 98.1 F (36.7 C) (05/27 0507) Temp src: Oral (05/27 0507) BP: 125/59 mmHg (05/27 0507) Pulse Rate: 69 (05/27 0507)  Labs:  Recent Labs  10/14/12 2343 10/15/12 0403 10/16/12 0344 10/17/12 0500  HGB 8.2* 8.6*  --  10.1*  HCT 25.1* 26.6*  --  30.3*  PLT  --  192  --  204  LABPROT  --  29.6* 19.2* 16.6*  INR  --  3.01* 1.68* 1.38  CREATININE  --  0.76 0.72 0.71    Estimated Creatinine Clearance: 35.3 ml/min (by C-G formula based on Cr of 0.71).  Assessment: Janice [name] admitted from SNF with fever and lethargy, on chronic warfarin therapy for afib.  Warfarin held at admission for SUPRAtherapeutic INR and resumed 5/26.   Home dose Coumadin: 3mg  daily  INR =1.38, subtherapeutic, still falling after dose held for 3 days, resumed last night so full effect from this not seen yet.    Lovenox consult ordered for bridge until INR therapeutic again.    CBC: Stable, no bleeding noted  Weight = 47kg  Renal:  SCr 0.71, stable. CrCl 35 ml/min  Goal of Therapy:  INR 2-3 Monitor platelets by anticoagulation protocol: Yes   Plan:   Repeat Warfarin 3mg  PO tonight  Lovenox 1.5 mg/kg/day until INR therapeutic.    Monitor CBC and INR daily  Haynes Hoehn, PharmD 10/17/2012 9:21 AM  Pager: [phone]

## 2012-10-18 DIAGNOSIS — J189 Pneumonia, unspecified organism: Secondary | ICD-10-CM

## 2012-10-18 LAB — CBC
HCT: 28.2 % — ABNORMAL LOW (ref 36.0–46.0)
Hemoglobin: 9.1 g/dL — ABNORMAL LOW (ref 12.0–15.0)
MCH: 29.4 pg (ref 26.0–34.0)
MCHC: 32.3 g/dL (ref 30.0–36.0)
RBC: 3.09 MIL/uL — ABNORMAL LOW (ref 3.87–5.11)

## 2012-10-18 LAB — OCCULT BLOOD X 1 CARD TO LAB, STOOL: Fecal Occult Bld: NEGATIVE

## 2012-10-18 LAB — PROTIME-INR: Prothrombin Time: 18 seconds — ABNORMAL HIGH (ref 11.6–15.2)

## 2012-10-18 LAB — ANAEROBIC CULTURE

## 2012-10-18 MED ORDER — CEPHALEXIN 500 MG PO CAPS: 500.0000 mg | ORAL_CAPSULE | Freq: Two times a day (BID) | ORAL | Status: DC

## 2012-10-18 MED ORDER — HYDROCODONE-ACETAMINOPHEN 5-325 MG PO TABS: 0.5000 | ORAL_TABLET | ORAL | Status: DC | PRN

## 2012-10-18 MED ORDER — WARFARIN SODIUM 3 MG PO TABS
3.0000 mg | ORAL_TABLET | Freq: Once | ORAL | Status: DC
  Filled 2012-10-18: qty 1

## 2012-10-18 NOTE — Progress Notes (Signed)
Physical Therapy Treatment Patient Details Name: Janice [name] MRN: [id number] DOB: 07-25-1921 Today's Date: 10/18/2012 Time: 4098-1191 PT Time Calculation (min): 26 min  PT Assessment / Plan / Recommendation Comments on Treatment Session  Assisted pt OOB to amb in hallway then position in recler.  Pt plans to return to [facility].    Follow Up Recommendations  Home health PT     Does the patient have the potential to tolerate intense rehabilitation     Barriers to Discharge        Equipment Recommendations  None recommended by PT    Recommendations for Other Services    Frequency Min 3X/week   Plan      Precautions / Restrictions Precautions Precautions: Fall Restrictions Weight Bearing Restrictions: No   Pertinent Vitals/Pain No c/o pain    Mobility  Bed Mobility Bed Mobility: Supine to Sit Supine to Sit: 4: Min assist Details for Bed Mobility Assistance: increased time Transfers Transfers: Sit to Stand;Stand to Sit Sit to Stand: 4: Min guard Stand to Sit: 4: Min guard Details for Transfer Assistance: increased time and 25% VC's on hand placement and increased time Ambulation/Gait Ambulation Distance (Feet): 28 Feet Assistive device: Rolling walker Ambulation/Gait Assistance Details: Pt admits she onlt amb short distances and uses her w/c to get to the dinning room for meals.  Gait Pattern: Step-through pattern;Trunk flexed Gait velocity: slow     PT Goals                                          progressing    Visit Information  Last PT Received On: 10/18/12 Assistance Needed: +1    Subjective Data      Cognition       Balance   fair  End of Session PT - End of Session Equipment Utilized During Treatment: Gait belt Activity Tolerance: Patient tolerated treatment well Patient left: in chair;with call bell/phone within reach;with chair alarm set;with family/visitor present Nurse Communication: Mobility status   Felecia Shelling  PTA [facility]   Acute  Rehab Pager      [phone]

## 2012-10-18 NOTE — Discharge Summary (Signed)
Physician Discharge Summary  Janice [name] DGU:[id number] DOB: May 17, 1922 DOA: 10/13/2012  PCP: Thayer Headings, MD  Admit date: 10/13/2012 Discharge date: 10/18/2012  Time spent: 40 minutes  Recommendations for Outpatient Follow-up:  1. Follow up with PCP evaluate leg as an outpateint.  Discharge Diagnoses:  Active Problems:   Atrial fibrillation   Hypothyroidism   Cellulitis   Discharge Condition: stable  Diet recommendation: heart healthy  Filed Weights   10/13/12 0900  Weight: 46.9 kg (103 lb 6.3 oz)    History of present illness:  See H&P for further details 79 y/o [name] who resides at the skilled nursing facility, was brought to the hospital for fever with temp greater than 102. She is extremely lethargic at this time, and unable to provide a good history. No family at bedside, she has a nephew who lives in [place], as per ED notes , patient was able to talk and Able to provide history when she arrived from SNF. No h/o nausea, vomiting, no chest pain, or shortness of breath. No neck pain. Patient;s SBP has dropped to 90's , and is now requiring normal saline bolus. Her UA is normal, CXR shows no pneumonia, her lactic acid is 1.4   Hospital Course:  SIRS/Bacteremia/Cellulitis: - Patient has infected left lower extremity ulcer and surrounding skin infection. Which most likely is the source of infection.  - Will continue with vancomycin and  zosyn. Blood cultures growing 1/2 bottles group a Strep. Urine culture is negative, wound culture grew Staph aureus.  - Change to Keflex for 2 weeks as an outpatient.  Dyspnea: Improved with diuresis, output>3048ml  Secondary to pulmonary edema  Will discontinue Lasix    Atrial fibrillation: - Patient will be monitored on telemetry  - Resume coumadin.  Protein calorie malnutrition  - Patient only weighs 45.9 EKG   Left leg ulcer: - wound culture is growing staph aureus, will continue with Keflex   Hypothyroidism: -  Continue Synthroid    Procedures:  Blood culture 1:2 : strep pyogenes  Consultations:  none  Discharge Exam: Filed Vitals:   10/17/12 0507 10/17/12 1300 10/17/12 2148 10/18/12 0423  BP: 125/59 123/55 116/51 117/52  Pulse: 69 67 85 85  Temp: 98.1 F (36.7 C) 98.7 F (37.1 C) 98.9 F (37.2 C) 98.5 F (36.9 C)  TempSrc: Oral Oral Oral Oral  Resp: 18 16 16 16   Height:      Weight:      SpO2: 100% 100% 97% 99%    General: A&O x3 Cardiovascular: RRR Respiratory: good air movement CTA B/L  Discharge Instructions  Discharge Orders   Future Orders Complete By Expires     Diet - low sodium heart healthy  As directed     Increase activity slowly  As directed         Medication List    TAKE these medications       benzonatate 100 MG capsule  Commonly known as:  TESSALON  Take 1 capsule (100 mg total) by mouth 3 (three) times daily.     beta carotene w/minerals tablet  Take by mouth daily.     bisoprolol 5 MG tablet  Commonly known as:  ZEBETA  Take 5 mg by mouth daily.     calcium carbonate 600 MG Tabs  Commonly known as:  OS-CAL  Take 600 mg by mouth 2 (two) times daily with a meal.     cephALEXin 500 MG capsule  Commonly known as:  KEFLEX  Take 1 capsule (  500 mg total) by mouth every 12 (twelve) hours.     folic acid 1 MG tablet  Commonly known as:  FOLVITE  Take 1 mg by mouth daily.     HYDROcodone-acetaminophen 5-325 MG per tablet  Commonly known as:  NORCO/VICODIN  Take 0.5-1 tablets by mouth every 4 (four) hours as needed. As needed for pain, may increase to one tab if needed     levothyroxine 50 MCG tablet  Commonly known as:  SYNTHROID, LEVOTHROID  Take 50 mcg by mouth daily.     Methotrexate Sodium (PF) 100 MG/4ML Soln  Inject 0.4 mLs as directed once a week. friday     omeprazole 20 MG capsule  Commonly known as:  PRILOSEC  Take 20 mg by mouth daily.     simvastatin 5 MG tablet  Commonly known as:  ZOCOR  Take 5 mg by mouth at  bedtime.     warfarin 2 MG tablet  Commonly known as:  COUMADIN  Take 3 mg by mouth daily. Take 1.5 tablets (3mg ) daily.       No Known Allergies     Follow-up Information   Follow up with Thayer Headings, MD In 3 weeks. (hospital follow up)    Contact information:   [address] [place] [address] [phone]        The results of significant diagnostics from this hospitalization (including imaging, microbiology, ancillary and laboratory) are listed below for reference.    Significant Diagnostic Studies: Dg Chest 2 View  10/13/2012   *RADIOLOGY REPORT*  Clinical Data: Fever and shortness of breath.  CHEST - 2 VIEW  Comparison: 08/24/2012.  Findings: The heart is mildly enlarged but stable.  Stable surgical changes.  The pacer wires are stable.  There are chronic lung changes.  Slightly low lung volumes with vascular crowding and streaky areas of atelectasis.  No pleural effusion or pulmonary edema.  IMPRESSION:  1.  Chronic lung changes. 2.  Patchy areas of atelectasis but no infiltrates or edema.   Original Report Authenticated By: Rudie Meyer, M.D.    Microbiology: Recent Results (from the past 240 hour(s))  URINE CULTURE     Status: None   Collection Time    10/13/12  4:58 AM      Result Value Range Status   Specimen Description URINE, CATHETERIZED   Final   Special Requests Normal   Final   Culture  Setup Time 10/13/2012 14:48   Final   Colony Count NO GROWTH   Final   Culture NO GROWTH   Final   Report Status 10/14/2012 FINAL   Final  CULTURE, BLOOD (ROUTINE X 2)     Status: None   Collection Time    10/13/12  5:20 AM      Result Value Range Status   Specimen Description BLOOD LEFT ANTECUBITAL   Final   Special Requests BOTTLES DRAWN AEROBIC AND ANAEROBIC   Final   Culture  Setup Time 10/13/2012 09:33   Final   Culture     Final   Value: GROUP A STREP (S.PYOGENES) ISOLATED     Note: CRITICAL RESULT CALLED TO, READ BACK BY AND VERIFIED  WITH: ANGELA ASHLEY 10/15/12 @ 8:47AM BY RUSCA.     Note: Gram Stain Report Called to,Read Back By and Verified With: JENNEL D AT 0352 10/14/12 BY LSNOW   Report Status 10/15/2012 FINAL   Final  CULTURE, BLOOD (ROUTINE X 2)     Status: None  Collection Time    10/13/12  6:05 AM      Result Value Range Status   Specimen Description BLOOD RIGHT FOREARM   Final   Special Requests BOTTLES DRAWN AEROBIC AND ANAEROBIC   Final   Culture  Setup Time 10/13/2012 09:35   Final   Culture     Final   Value:        BLOOD CULTURE RECEIVED NO GROWTH TO DATE CULTURE WILL BE HELD FOR 5 DAYS BEFORE ISSUING A FINAL NEGATIVE REPORT   Report Status PENDING   Incomplete  MRSA PCR SCREENING     Status: None   Collection Time    10/13/12  9:07 AM      Result Value Range Status   MRSA by PCR NEGATIVE  NEGATIVE Final   Comment:            The GeneXpert MRSA Assay (FDA     approved for NASAL specimens     only), is one component of a     comprehensive MRSA colonization     surveillance program. It is not     intended to diagnose MRSA     infection nor to guide or     monitor treatment for     MRSA infections.  WOUND CULTURE     Status: None   Collection Time    10/13/12 11:03 AM      Result Value Range Status   Specimen Description LEG   Final   Special Requests Normal   Final   Gram Stain     Final   Value: NO WBC SEEN     NO SQUAMOUS EPITHELIAL CELLS SEEN     NO ORGANISMS SEEN   Culture     Final   Value: FEW STAPHYLOCOCCUS AUREUS     Note: RIFAMPIN AND GENTAMICIN SHOULD NOT BE USED AS SINGLE DRUGS FOR TREATMENT OF STAPH INFECTIONS.   Report Status 10/15/2012 FINAL   Final   Organism ID, Bacteria STAPHYLOCOCCUS AUREUS   Final  ANAEROBIC CULTURE     Status: None   Collection Time    10/13/12 11:04 AM      Result Value Range Status   Specimen Description LEG LT LOWER   Final   Special Requests Normal   Final   Gram Stain     Final   Value: NO WBC SEEN     NO SQUAMOUS EPITHELIAL CELLS SEEN      NO ORGANISMS SEEN   Culture NO ANAEROBES ISOLATED   Final   Report Status 10/18/2012 FINAL   Final     Labs: Basic Metabolic Panel:  Recent Labs Lab 10/13/12 0520 10/14/12 0330 10/15/12 0403 10/16/12 0344 10/17/12 0500  NA 133* 136 136 135 134*  K 3.3* 3.8 3.7 3.0* 4.6  CL 96 108 109 104 103  CO2 27 21 20 23 26   GLUCOSE 140* 109* 99 96 90  BUN 26* 21 25* 20 20  CREATININE 0.83 0.88 0.76 0.72 0.71  CALCIUM 8.9 7.2* 7.9* 8.0* 8.8   Liver Function Tests:  Recent Labs Lab 10/14/12 0330 10/15/12 0403  AST 21 22  ALT 12 12  ALKPHOS 64 67  BILITOT 0.3 0.2*  PROT 4.6* 5.1*  ALBUMIN 1.9* 2.1*   No results found for this basename: LIPASE, AMYLASE,  in the last 168 hours No results found for this basename: AMMONIA,  in the last 168 hours CBC:  Recent Labs Lab 10/13/12 0520 10/14/12 0330 10/14/12 0529  10/14/12 1546 10/14/12 2343 10/15/12 0403 10/17/12 0500 10/18/12 0440  WBC 9.3 13.8* 12.6*  --   --   --  6.6 5.6 5.8  NEUTROABS 8.9*  --   --   --   --   --   --   --   --   HGB 14.9 7.8* 8.1*  < > 8.2* 8.2* 8.6* 10.1* 9.1*  HCT 43.5 24.0* 24.8*  < > 24.7* 25.1* 26.6* 30.3* 28.2*  MCV Janice.0 91.6 91.9  --   --   --  92.7 91.5 91.3  PLT 145* 161 161  --   --   --  192 204 212  < > = values in this interval not displayed. Cardiac Enzymes: No results found for this basename: CKTOTAL, CKMB, CKMBINDEX, TROPONINI,  in the last 168 hours BNP: BNP (last 3 results) No results found for this basename: PROBNP,  in the last 8760 hours CBG: No results found for this basename: GLUCAP,  in the last 168 hours     Signed:  Marinda Elk  [facility] 10/18/2012, 12:19 PM

## 2012-10-18 NOTE — Progress Notes (Signed)
ANTICOAGULATION CONSULT NOTE - Follow-up Consult  Pharmacy Consult for warfarin/lovenox Indication: atrial fibrillation  No Known Allergies  Patient Measurements: Height: 5\' 7"  (170.2 cm) Weight: 103 lb 6.3 oz (46.9 kg) IBW/kg (Calculated) : 61.6  Vital Signs: Temp: 98.5 F (36.9 C) (05/28 0423) Temp src: Oral (05/28 0423) BP: 117/52 mmHg (05/28 0423) Pulse Rate: 85 (05/28 0423)  Labs:  Recent Labs  10/16/12 0344 10/17/12 0500 10/18/12 0440  HGB  --  10.1* 9.1*  HCT  --  30.3* 28.2*  PLT  --  204 212  LABPROT 19.2* 16.6* 18.0*  INR 1.68* 1.38 1.54*  CREATININE 0.72 0.71  --     Estimated Creatinine Clearance: 35.3 ml/min (by C-G formula based on Cr of 0.71).  Assessment: 50 YOF admitted from SNF with fever and lethargy, on chronic warfarin therapy for afib.  Warfarin held at admission for SUPRAtherapeutic INR and resumed 5/26.   Home dose Coumadin: 3mg  daily  INR 1.54, subtherapeutic after dose held for 3 days 5/23-5/25    Lovenox 1.5mg /kg q24h started 5/27 to bridge until INR therapeuitc.  CBC: Stable, no bleeding noted  Renal:  SCr 0.71, stable. CrCl 35 ml/min  Goal of Therapy:  INR 2-3 Monitor platelets by anticoagulation protocol: Yes   Plan:   Repeat Warfarin 3mg  PO tonight  Lovenox 1.5 mg/kg/day until INR therapeutic.    Monitor CBC and INR daily  Loralee Pacas, PharmD, BCPS Pager: [phone] 10/18/2012 11:54 AM

## 2012-10-19 LAB — CULTURE, BLOOD (ROUTINE X 2): Culture: NO GROWTH

## 2012-10-23 ENCOUNTER — Emergency Department (HOSPITAL_COMMUNITY)
Admission: EM | Admit: 2012-10-23 | Discharge: 2012-10-23 | Disposition: A | Discharge status: Skilled Nursing Facility | Payer: Medicare Other | Attending: Emergency Medicine | Admitting: Emergency Medicine

## 2012-10-23 ENCOUNTER — Encounter (HOSPITAL_COMMUNITY): Payer: Self-pay | Admitting: *Deleted

## 2012-10-23 DIAGNOSIS — N289 Disorder of kidney and ureter, unspecified: Secondary | ICD-10-CM | POA: Insufficient documentation

## 2012-10-23 DIAGNOSIS — L0390 Cellulitis, unspecified: Secondary | ICD-10-CM

## 2012-10-23 DIAGNOSIS — K219 Gastro-esophageal reflux disease without esophagitis: Secondary | ICD-10-CM | POA: Insufficient documentation

## 2012-10-23 DIAGNOSIS — Z79899 Other long term (current) drug therapy: Secondary | ICD-10-CM | POA: Insufficient documentation

## 2012-10-23 DIAGNOSIS — Z862 Personal history of diseases of the blood and blood-forming organs and certain disorders involving the immune mechanism: Secondary | ICD-10-CM | POA: Insufficient documentation

## 2012-10-23 DIAGNOSIS — I129 Hypertensive chronic kidney disease with stage 1 through stage 4 chronic kidney disease, or unspecified chronic kidney disease: Secondary | ICD-10-CM | POA: Insufficient documentation

## 2012-10-23 DIAGNOSIS — E039 Hypothyroidism, unspecified: Secondary | ICD-10-CM | POA: Insufficient documentation

## 2012-10-23 DIAGNOSIS — Y9210 Unspecified residential institution as the place of occurrence of the external cause: Secondary | ICD-10-CM | POA: Insufficient documentation

## 2012-10-23 DIAGNOSIS — Z7901 Long term (current) use of anticoagulants: Secondary | ICD-10-CM | POA: Insufficient documentation

## 2012-10-23 DIAGNOSIS — Z8639 Personal history of other endocrine, nutritional and metabolic disease: Secondary | ICD-10-CM | POA: Insufficient documentation

## 2012-10-23 DIAGNOSIS — L02419 Cutaneous abscess of limb, unspecified: Secondary | ICD-10-CM | POA: Insufficient documentation

## 2012-10-23 DIAGNOSIS — Z8781 Personal history of (healed) traumatic fracture: Secondary | ICD-10-CM | POA: Insufficient documentation

## 2012-10-23 DIAGNOSIS — S81009A Unspecified open wound, unspecified knee, initial encounter: Secondary | ICD-10-CM | POA: Insufficient documentation

## 2012-10-23 DIAGNOSIS — Z8679 Personal history of other diseases of the circulatory system: Secondary | ICD-10-CM | POA: Insufficient documentation

## 2012-10-23 DIAGNOSIS — Z950 Presence of cardiac pacemaker: Secondary | ICD-10-CM | POA: Insufficient documentation

## 2012-10-23 DIAGNOSIS — Y9389 Activity, other specified: Secondary | ICD-10-CM | POA: Insufficient documentation

## 2012-10-23 DIAGNOSIS — M316 Other giant cell arteritis: Secondary | ICD-10-CM | POA: Insufficient documentation

## 2012-10-23 DIAGNOSIS — X58XXXA Exposure to other specified factors, initial encounter: Secondary | ICD-10-CM | POA: Insufficient documentation

## 2012-10-23 DIAGNOSIS — L03119 Cellulitis of unspecified part of limb: Secondary | ICD-10-CM | POA: Insufficient documentation

## 2012-10-23 DIAGNOSIS — E785 Hyperlipidemia, unspecified: Secondary | ICD-10-CM | POA: Insufficient documentation

## 2012-10-23 HISTORY — DX: Essential (primary) hypertension: I10

## 2012-10-23 HISTORY — DX: Disorder of kidney and ureter, unspecified: N28.9

## 2012-10-23 HISTORY — DX: Hyperlipidemia, unspecified: E78.5

## 2012-10-23 LAB — CBC WITH DIFFERENTIAL/PLATELET
Basophils Absolute: 0.1 10*3/uL (ref 0.0–0.1)
Eosinophils Absolute: 0.1 10*3/uL (ref 0.0–0.7)
Eosinophils Relative: 3 % (ref 0–5)
Lymphocytes Relative: 19 % (ref 12–46)
MCV: 91.6 fL (ref 78.0–100.0)
Neutrophils Relative %: 67 % (ref 43–77)
Platelets: 285 10*3/uL (ref 150–400)
RDW: 15.3 % (ref 11.5–15.5)
WBC: 3.7 10*3/uL — ABNORMAL LOW (ref 4.0–10.5)

## 2012-10-23 LAB — PROTIME-INR: Prothrombin Time: 16.4 seconds — ABNORMAL HIGH (ref 11.6–15.2)

## 2012-10-23 LAB — POCT I-STAT, CHEM 8
Calcium, Ion: 1.19 mmol/L (ref 1.13–1.30)
Glucose, Bld: 80 mg/dL (ref 70–99)
HCT: 31 % — ABNORMAL LOW (ref 36.0–46.0)
Hemoglobin: 10.5 g/dL — ABNORMAL LOW (ref 12.0–15.0)
Potassium: 4.4 mEq/L (ref 3.5–5.1)

## 2012-10-23 NOTE — ED Notes (Signed)
[place] RN given report on the pt. Will call and set up PTAR for transportation back to the facility.

## 2012-10-23 NOTE — ED Provider Notes (Signed)
Patient was received from sign out from Earley Favor NP disposition pending lab results with goal to discharge patient home. Pt in NAD. VSS. Erythematous bilateral lower extremities with 1+ pitting LLE and 2+ pitting RLE. Skin tears have been dressed with bleeding controlled. Labs reviewed. Patient is to followup with PCP. Advised to continue taking her Keflex for cellulitis. Return precautions discussed. Patient agreeable to plan. Stable at discharge.  Jeannetta Ellis, PA-C 10/23/12 1517

## 2012-10-23 NOTE — ED Notes (Signed)
Pt with renal problems and noted edema to right leg pitting of 2+ per EMS. Also noted weeping of the RLE. Pt has a skin tear to the LLE and was recently treated for an infection in the LLE.
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
## 2012-10-23 NOTE — ED Notes (Signed)
Bed:WA20<BR> Expected date:<BR> Expected time:<BR> Means of arrival:<BR> Comments:<BR> ems

## 2012-10-30 NOTE — ED Provider Notes (Signed)
Medical screening examination/treatment/procedure(s) were performed by non-physician practitioner and as supervising physician I was immediately available for consultation/collaboration.  Aren Pryde R. Aviona Martenson, MD 10/30/12 0721 

## 2012-10-30 NOTE — ED Provider Notes (Signed)
Medical screening examination/treatment/procedure(s) were performed by non-physician practitioner and as supervising physician I was immediately available for consultation/collaboration.  Nelda Luckey R. Denaisha Swango, MD 10/30/12 0721 

## 2013-01-17 ENCOUNTER — Ambulatory Visit (INDEPENDENT_AMBULATORY_CARE_PROVIDER_SITE_OTHER): Payer: Medicare Other | Admitting: Internal Medicine

## 2013-01-17 ENCOUNTER — Encounter: Payer: Self-pay | Admitting: Internal Medicine

## 2013-01-17 VITALS — BP 92/57 | HR 69 | Ht 68.0 in | Wt 100.0 lb

## 2013-01-17 DIAGNOSIS — Z950 Presence of cardiac pacemaker: Secondary | ICD-10-CM

## 2013-01-17 DIAGNOSIS — I4891 Unspecified atrial fibrillation: Secondary | ICD-10-CM

## 2013-01-17 DIAGNOSIS — I442 Atrioventricular block, complete: Secondary | ICD-10-CM

## 2013-01-17 LAB — PACEMAKER DEVICE OBSERVATION
BRDY-0004RV: 120 {beats}/min
BRDY-0005RV: 60 {beats}/min
RV LEAD THRESHOLD: 0.5 V

## 2013-01-17 NOTE — Patient Instructions (Signed)
Your physician wants you to follow-up in: 12 months with Dr. Taylor. You will receive a reminder letter in the mail two months in advance. If you don't receive a letter, please call our office to schedule the follow-up appointment.    

## 2013-01-21 ENCOUNTER — Encounter: Payer: Self-pay | Admitting: Internal Medicine

## 2013-01-21 NOTE — Assessment & Plan Note (Signed)
Her [employer] single chamber PPM is working normally. Will recheck in several months.  

## 2013-01-21 NOTE — Assessment & Plan Note (Signed)
Her ventricular rate is well controlled. She will continue her current medical therapy. 
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
## 2013-02-20 ENCOUNTER — Encounter: Payer: Self-pay | Admitting: Internal Medicine

## 2013-04-27 ENCOUNTER — Encounter: Payer: Self-pay | Admitting: Internal Medicine

## 2013-04-27 DIAGNOSIS — I4891 Unspecified atrial fibrillation: Secondary | ICD-10-CM

## 2013-07-27 ENCOUNTER — Encounter: Payer: Self-pay | Admitting: Internal Medicine

## 2013-07-27 DIAGNOSIS — I4891 Unspecified atrial fibrillation: Secondary | ICD-10-CM

## 2013-11-05 ENCOUNTER — Encounter: Payer: Self-pay | Admitting: Internal Medicine

## 2013-11-05 DIAGNOSIS — I4891 Unspecified atrial fibrillation: Secondary | ICD-10-CM

## 2014-02-04 DIAGNOSIS — I4891 Unspecified atrial fibrillation: Secondary | ICD-10-CM

## 2014-03-24 ENCOUNTER — Emergency Department (HOSPITAL_COMMUNITY)
Admission: EM | Admit: 2014-03-24 | Discharge: 2014-03-24 | Disposition: A | Discharge status: Home / Self Care | Payer: Medicare Other | Attending: Emergency Medicine | Admitting: Emergency Medicine

## 2014-03-24 ENCOUNTER — Encounter (HOSPITAL_COMMUNITY): Payer: Self-pay | Admitting: Nurse Practitioner

## 2014-03-24 ENCOUNTER — Emergency Department (HOSPITAL_COMMUNITY): Payer: Medicare Other

## 2014-03-24 DIAGNOSIS — Z792 Long term (current) use of antibiotics: Secondary | ICD-10-CM | POA: Diagnosis not present

## 2014-03-24 DIAGNOSIS — K219 Gastro-esophageal reflux disease without esophagitis: Secondary | ICD-10-CM | POA: Diagnosis not present

## 2014-03-24 DIAGNOSIS — Y9389 Activity, other specified: Secondary | ICD-10-CM | POA: Diagnosis not present

## 2014-03-24 DIAGNOSIS — E039 Hypothyroidism, unspecified: Secondary | ICD-10-CM | POA: Diagnosis not present

## 2014-03-24 DIAGNOSIS — Z950 Presence of cardiac pacemaker: Secondary | ICD-10-CM | POA: Diagnosis not present

## 2014-03-24 DIAGNOSIS — I129 Hypertensive chronic kidney disease with stage 1 through stage 4 chronic kidney disease, or unspecified chronic kidney disease: Secondary | ICD-10-CM | POA: Diagnosis not present

## 2014-03-24 DIAGNOSIS — Z79899 Other long term (current) drug therapy: Secondary | ICD-10-CM | POA: Diagnosis not present

## 2014-03-24 DIAGNOSIS — W19XXXA Unspecified fall, initial encounter: Secondary | ICD-10-CM

## 2014-03-24 DIAGNOSIS — I4891 Unspecified atrial fibrillation: Secondary | ICD-10-CM | POA: Diagnosis not present

## 2014-03-24 DIAGNOSIS — S0101XA Laceration without foreign body of scalp, initial encounter: Secondary | ICD-10-CM | POA: Diagnosis not present

## 2014-03-24 DIAGNOSIS — Z8739 Personal history of other diseases of the musculoskeletal system and connective tissue: Secondary | ICD-10-CM | POA: Diagnosis not present

## 2014-03-24 DIAGNOSIS — E785 Hyperlipidemia, unspecified: Secondary | ICD-10-CM | POA: Diagnosis not present

## 2014-03-24 DIAGNOSIS — Y929 Unspecified place or not applicable: Secondary | ICD-10-CM | POA: Diagnosis not present

## 2014-03-24 DIAGNOSIS — N189 Chronic kidney disease, unspecified: Secondary | ICD-10-CM | POA: Insufficient documentation

## 2014-03-24 DIAGNOSIS — Z7901 Long term (current) use of anticoagulants: Secondary | ICD-10-CM | POA: Insufficient documentation

## 2014-03-24 DIAGNOSIS — Z8781 Personal history of (healed) traumatic fracture: Secondary | ICD-10-CM | POA: Insufficient documentation

## 2014-03-24 DIAGNOSIS — S0990XA Unspecified injury of head, initial encounter: Secondary | ICD-10-CM | POA: Diagnosis present

## 2014-03-24 LAB — CBC WITH DIFFERENTIAL/PLATELET
BASOS PCT: 1 % (ref 0–1)
Basophils Absolute: 0 10*3/uL (ref 0.0–0.1)
EOS ABS: 0.1 10*3/uL (ref 0.0–0.7)
EOS PCT: 2 % (ref 0–5)
HCT: 34.8 % — ABNORMAL LOW (ref 36.0–46.0)
Hemoglobin: 11.5 g/dL — ABNORMAL LOW (ref 12.0–15.0)
LYMPHS ABS: 1.1 10*3/uL (ref 0.7–4.0)
Lymphocytes Relative: 24 % (ref 12–46)
MCH: 29.4 pg (ref 26.0–34.0)
MCHC: 33 g/dL (ref 30.0–36.0)
MCV: 89 fL (ref 78.0–100.0)
Monocytes Absolute: 0.5 10*3/uL (ref 0.1–1.0)
Monocytes Relative: 11 % (ref 3–12)
NEUTROS PCT: 62 % (ref 43–77)
Neutro Abs: 2.8 10*3/uL (ref 1.7–7.7)
PLATELETS: 181 10*3/uL (ref 150–400)
RBC: 3.91 MIL/uL (ref 3.87–5.11)
RDW: 13.9 % (ref 11.5–15.5)
WBC: 4.5 10*3/uL (ref 4.0–10.5)

## 2014-03-24 LAB — PROTIME-INR
INR: 2.03 — ABNORMAL HIGH (ref 0.00–1.49)
PROTHROMBIN TIME: 23.1 s — AB (ref 11.6–15.2)

## 2014-03-24 MED ORDER — LIDOCAINE-EPINEPHRINE 1 %-1:100000 IJ SOLN
INTRAMUSCULAR | Status: AC
  Administered 2014-03-24: 10 mL
  Filled 2014-03-24: qty 1

## 2014-03-24 MED ORDER — LIDOCAINE-EPINEPHRINE (PF) 1 %-1:200000 IJ SOLN: 10.0000 mL | Freq: Once | INTRAMUSCULAR | Status: AC

## 2014-03-24 NOTE — Discharge Instructions (Signed)
Sutures out in 7-10 days.  Laceration Care, Adult A laceration is a cut or lesion that goes through all layers of the skin and into the tissue just beneath the skin. TREATMENT  Some lacerations may not require closure. Some lacerations may not be able to be closed due to an increased risk of infection. It is important to see your caregiver as soon as possible after an injury to minimize the risk of infection and maximize the opportunity for successful closure. If closure is appropriate, pain medicines may be given, if needed. The wound will be cleaned to help prevent infection. Your caregiver will use stitches (sutures), staples, wound glue (adhesive), or skin adhesive strips to repair the laceration. These tools bring the skin edges together to allow for faster healing and a better cosmetic outcome. However, all wounds will heal with a scar. Once the wound has healed, scarring can be minimized by covering the wound with sunscreen during the day for 1 full year. HOME CARE INSTRUCTIONS  For sutures or staples:  Keep the wound clean and dry.  If you were given a bandage (dressing), you should change it at least once a day. Also, change the dressing if it becomes wet or dirty, or as directed by your caregiver.  Wash the wound with soap and water 2 times a day. Rinse the wound off with water to remove all soap. Pat the wound dry with a clean towel.  After cleaning, apply a thin layer of the antibiotic ointment as recommended by your caregiver. This will help prevent infection and keep the dressing from sticking.  You may shower as usual after the first 24 hours. Do not soak the wound in water until the sutures are removed.  Only take over-the-counter or prescription medicines for pain, discomfort, or fever as directed by your caregiver.  Get your sutures or staples removed as directed by your caregiver. For skin adhesive strips:  Keep the wound clean and dry.  Do not get the skin adhesive  strips wet. You may bathe carefully, using caution to keep the wound dry.  If the wound gets wet, pat it dry with a clean towel.  Skin adhesive strips will fall off on their own. You may trim the strips as the wound heals. Do not remove skin adhesive strips that are still stuck to the wound. They will fall off in time. For wound adhesive:  You may briefly wet your wound in the shower or bath. Do not soak or scrub the wound. Do not swim. Avoid periods of heavy perspiration until the skin adhesive has fallen off on its own. After showering or bathing, gently pat the wound dry with a clean towel.  Do not apply liquid medicine, cream medicine, or ointment medicine to your wound while the skin adhesive is in place. This may loosen the film before your wound is healed.  If a dressing is placed over the wound, be careful not to apply tape directly over the skin adhesive. This may cause the adhesive to be pulled off before the wound is healed.  Avoid prolonged exposure to sunlight or tanning lamps while the skin adhesive is in place. Exposure to ultraviolet light in the first year will darken the scar.  The skin adhesive will usually remain in place for 5 to 10 days, then naturally fall off the skin. Do not pick at the adhesive film. You may need a tetanus shot if:  You cannot remember when you had your last tetanus shot.  You have never had a tetanus shot. If you get a tetanus shot, your arm may swell, get red, and feel warm to the touch. This is common and not a problem. If you need a tetanus shot and you choose not to have one, there is a rare chance of getting tetanus. Sickness from tetanus can be serious. SEEK MEDICAL CARE IF:   You have redness, swelling, or increasing pain in the wound.  You see a red line that goes away from the wound.  You have yellowish-white fluid (pus) coming from the wound.  You have a fever.  You notice a bad smell coming from the wound or dressing.  Your  wound breaks open before or after sutures have been removed.  You notice something coming out of the wound such as wood or glass.  Your wound is on your hand or foot and you cannot move a finger or toe. SEEK IMMEDIATE MEDICAL CARE IF:   Your pain is not controlled with prescribed medicine.  You have severe swelling around the wound causing pain and numbness or a change in color in your arm, hand, leg, or foot.  Your wound splits open and starts bleeding.  You have worsening numbness, weakness, or loss of function of any joint around or beyond the wound.  You develop painful lumps near the wound or on the skin anywhere on your body. MAKE SURE YOU:   Understand these instructions.  Will watch your condition.  Will get help right away if you are not doing well or get worse. Document Released: 05/10/2005 Document Revised: 08/02/2011 Document Reviewed: 11/03/2010 [facility] Patient Information 2015 [place], [place]. This information is not intended to replace advice given to you by your health care provider. Make sure you discuss any questions you have with your health care provider.

## 2014-03-24 NOTE — ED Notes (Signed)
Bed: WA20 Expected date:  Expected time:  Means of arrival:  Comments: 

## 2014-03-24 NOTE — ED Notes (Signed)
Pt presents via EMS from [facility], report of a temporal laceration secondary to a fall of wheel chair. Pt on coumadin, no active bleeding at this time. No LOC, denies dizziness, n/v/d. Pt AOx4. C/o of a headache 2/10.
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
## 2014-04-17 ENCOUNTER — Encounter: Payer: Self-pay | Admitting: *Deleted

## 2014-05-06 DIAGNOSIS — I4891 Unspecified atrial fibrillation: Secondary | ICD-10-CM

## 2014-08-06 DIAGNOSIS — I4891 Unspecified atrial fibrillation: Secondary | ICD-10-CM | POA: Diagnosis not present

## 2014-09-03 ENCOUNTER — Encounter: Payer: Self-pay | Admitting: Internal Medicine

## 2014-09-16 IMAGING — CR DG CHEST 2V
2 series · 2 of 2 positions shown · non-contrast
Comparison: 08/24/2012.

CLINICAL DATA: Fever and shortness of breath.

CHEST - 2 VIEW

[w chest lat]
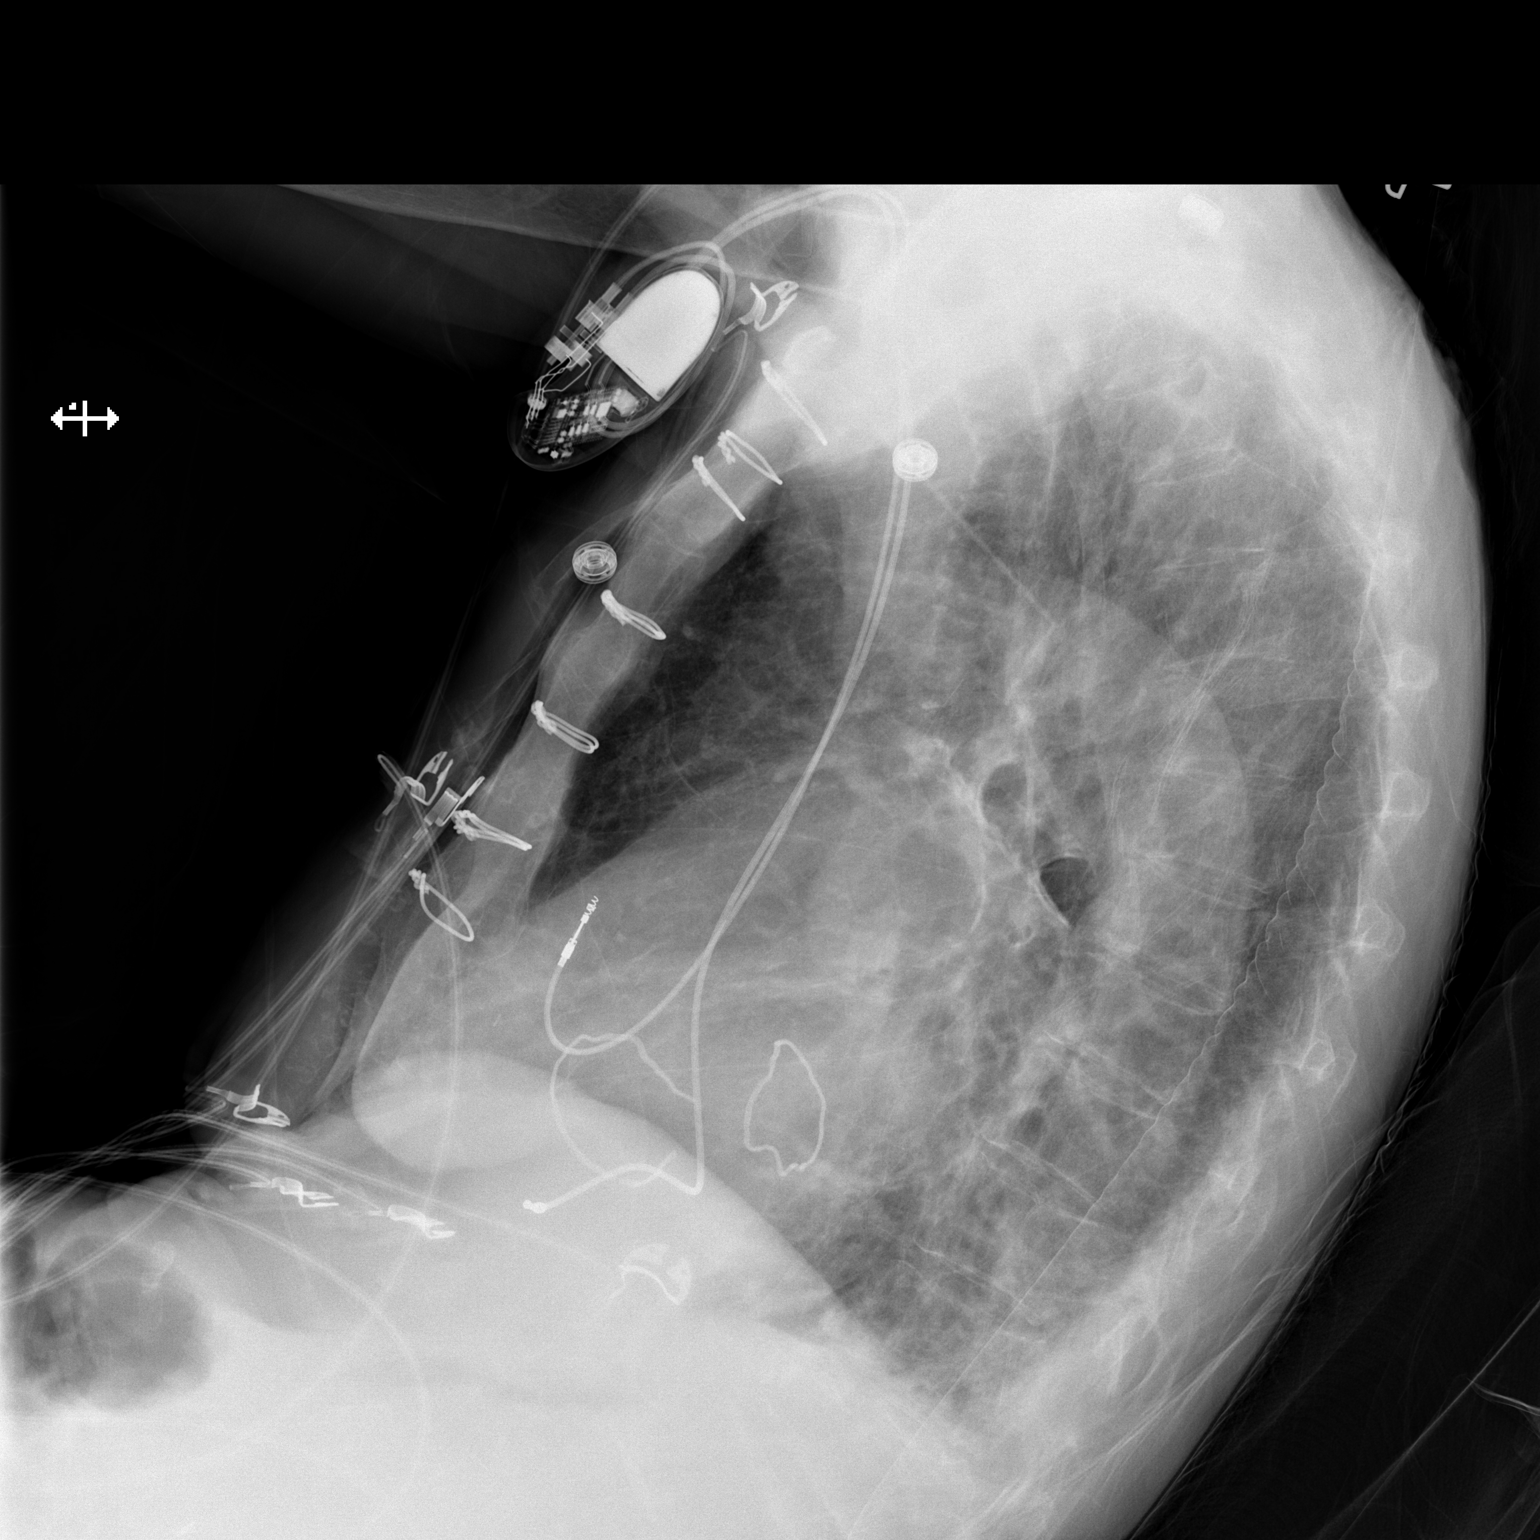

[x chest ap]
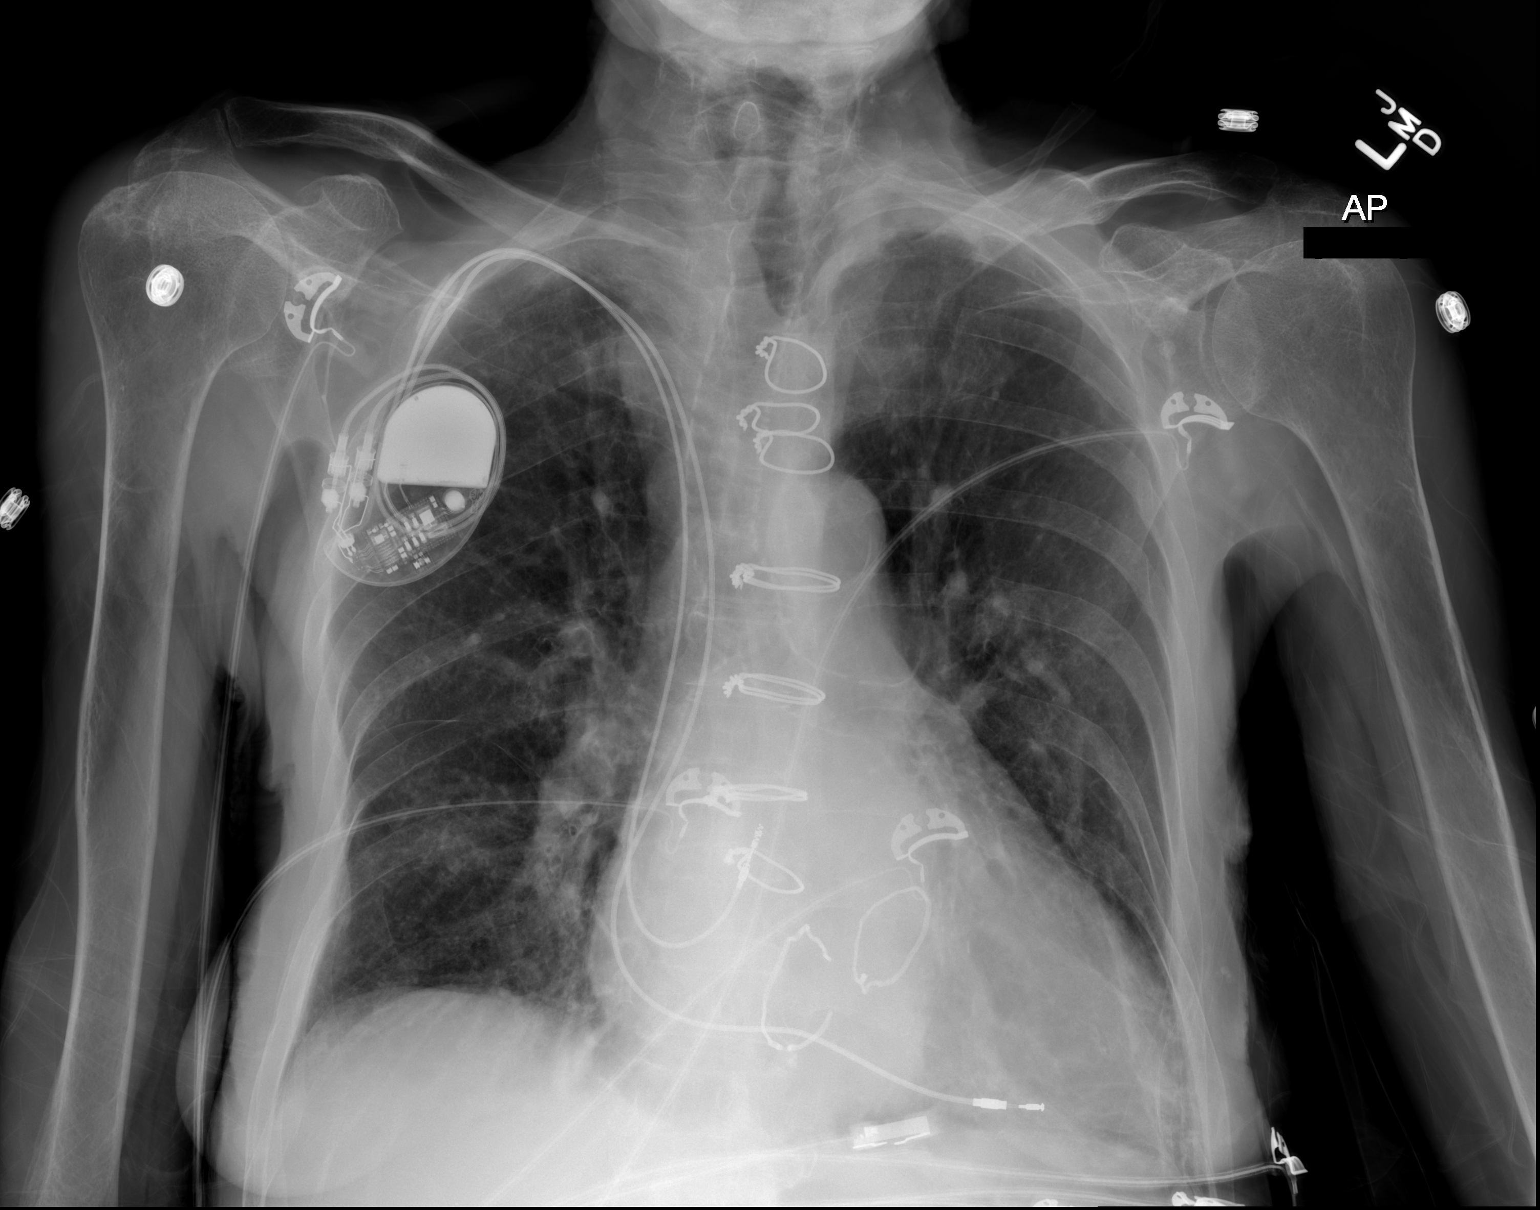

[2 of 2 positions shown; findings below may reference images not displayed]

FINDINGS: The heart is mildly enlarged but stable.  Stable surgical
changes.  The pacer wires are stable.  There are chronic lung
changes.  Slightly low lung volumes with vascular crowding and
streaky areas of atelectasis.  No pleural effusion or pulmonary
edema.
IMPRESSION: 1.  Chronic lung changes.
2.  Patchy areas of atelectasis but no infiltrates or edema.

## 2014-11-05 DIAGNOSIS — I4891 Unspecified atrial fibrillation: Secondary | ICD-10-CM | POA: Diagnosis not present

## 2015-02-06 DIAGNOSIS — I4891 Unspecified atrial fibrillation: Secondary | ICD-10-CM | POA: Diagnosis not present

## 2015-04-15 ENCOUNTER — Inpatient Hospital Stay (HOSPITAL_COMMUNITY)
Admission: EM | Admit: 2015-04-15 | Discharge: 2015-04-16 | DRG: 602 | Disposition: A | Discharge status: Home / Self Care | Payer: Medicare Other | Attending: Internal Medicine | Admitting: Internal Medicine

## 2015-04-15 ENCOUNTER — Encounter (HOSPITAL_COMMUNITY): Payer: Self-pay | Admitting: *Deleted

## 2015-04-15 ENCOUNTER — Emergency Department (HOSPITAL_COMMUNITY): Payer: Medicare Other

## 2015-04-15 DIAGNOSIS — I442 Atrioventricular block, complete: Secondary | ICD-10-CM | POA: Diagnosis present

## 2015-04-15 DIAGNOSIS — D649 Anemia, unspecified: Secondary | ICD-10-CM | POA: Diagnosis present

## 2015-04-15 DIAGNOSIS — L03115 Cellulitis of right lower limb: Principal | ICD-10-CM | POA: Diagnosis present

## 2015-04-15 DIAGNOSIS — N179 Acute kidney failure, unspecified: Secondary | ICD-10-CM | POA: Diagnosis present

## 2015-04-15 DIAGNOSIS — I482 Chronic atrial fibrillation: Secondary | ICD-10-CM

## 2015-04-15 DIAGNOSIS — K219 Gastro-esophageal reflux disease without esophagitis: Secondary | ICD-10-CM | POA: Diagnosis present

## 2015-04-15 DIAGNOSIS — E43 Unspecified severe protein-calorie malnutrition: Secondary | ICD-10-CM | POA: Diagnosis present

## 2015-04-15 DIAGNOSIS — L0390 Cellulitis, unspecified: Secondary | ICD-10-CM | POA: Diagnosis present

## 2015-04-15 DIAGNOSIS — E039 Hypothyroidism, unspecified: Secondary | ICD-10-CM | POA: Diagnosis present

## 2015-04-15 DIAGNOSIS — A419 Sepsis, unspecified organism: Secondary | ICD-10-CM

## 2015-04-15 DIAGNOSIS — Z7901 Long term (current) use of anticoagulants: Secondary | ICD-10-CM | POA: Diagnosis not present

## 2015-04-15 DIAGNOSIS — R509 Fever, unspecified: Secondary | ICD-10-CM | POA: Diagnosis present

## 2015-04-15 DIAGNOSIS — E785 Hyperlipidemia, unspecified: Secondary | ICD-10-CM | POA: Diagnosis present

## 2015-04-15 DIAGNOSIS — Z681 Body mass index (BMI) 19 or less, adult: Secondary | ICD-10-CM | POA: Diagnosis not present

## 2015-04-15 DIAGNOSIS — E860 Dehydration: Secondary | ICD-10-CM | POA: Diagnosis present

## 2015-04-15 DIAGNOSIS — Z950 Presence of cardiac pacemaker: Secondary | ICD-10-CM | POA: Diagnosis not present

## 2015-04-15 DIAGNOSIS — I4891 Unspecified atrial fibrillation: Secondary | ICD-10-CM | POA: Diagnosis present

## 2015-04-15 DIAGNOSIS — Z66 Do not resuscitate: Secondary | ICD-10-CM | POA: Diagnosis present

## 2015-04-15 DIAGNOSIS — I10 Essential (primary) hypertension: Secondary | ICD-10-CM | POA: Diagnosis present

## 2015-04-15 LAB — COMPREHENSIVE METABOLIC PANEL
ALT: 10 U/L — ABNORMAL LOW (ref 14–54)
AST: 18 U/L (ref 15–41)
Albumin: 2.8 g/dL — ABNORMAL LOW (ref 3.5–5.0)
Alkaline Phosphatase: 104 U/L (ref 38–126)
Anion gap: 8 (ref 5–15)
BUN: 31 mg/dL — ABNORMAL HIGH (ref 6–20)
CO2: 26 mmol/L (ref 22–32)
Calcium: 7.9 mg/dL — ABNORMAL LOW (ref 8.9–10.3)
Chloride: 105 mmol/L (ref 101–111)
Creatinine, Ser: 1.14 mg/dL — ABNORMAL HIGH (ref 0.44–1.00)
GFR calc Af Amer: 47 mL/min — ABNORMAL LOW (ref >60)
GFR calc non Af Amer: 41 mL/min — ABNORMAL LOW (ref >60)
Glucose, Bld: 120 mg/dL — ABNORMAL HIGH (ref 65–99)
Potassium: 4.2 mmol/L (ref 3.5–5.1)
Sodium: 139 mmol/L (ref 135–145)
Total Bilirubin: 1.3 mg/dL — ABNORMAL HIGH (ref 0.3–1.2)
Total Protein: 5.9 g/dL — ABNORMAL LOW (ref 6.5–8.1)

## 2015-04-15 LAB — CBC WITH DIFFERENTIAL/PLATELET
Basophils Absolute: 0 10*3/uL (ref 0.0–0.1)
Basophils Relative: 0 %
Eosinophils Absolute: 0 10*3/uL (ref 0.0–0.7)
Eosinophils Relative: 0 %
HCT: 27.8 % — ABNORMAL LOW (ref 36.0–46.0)
Hemoglobin: 9 g/dL — ABNORMAL LOW (ref 12.0–15.0)
Lymphocytes Relative: 5 %
Lymphs Abs: 0.6 10*3/uL — ABNORMAL LOW (ref 0.7–4.0)
MCH: 28.1 pg (ref 26.0–34.0)
MCHC: 32.4 g/dL (ref 30.0–36.0)
MCV: 86.9 fL (ref 78.0–100.0)
Monocytes Absolute: 0.7 10*3/uL (ref 0.1–1.0)
Monocytes Relative: 6 %
Neutro Abs: 11.5 10*3/uL — ABNORMAL HIGH (ref 1.7–7.7)
Neutrophils Relative %: 89 %
Platelets: 201 10*3/uL (ref 150–400)
RBC: 3.2 MIL/uL — ABNORMAL LOW (ref 3.87–5.11)
RDW: 14.3 % (ref 11.5–15.5)
WBC: 12.8 10*3/uL — ABNORMAL HIGH (ref 4.0–10.5)

## 2015-04-15 LAB — URINE MICROSCOPIC-ADD ON

## 2015-04-15 LAB — URINALYSIS, ROUTINE W REFLEX MICROSCOPIC
Bilirubin Urine: NEGATIVE
Glucose, UA: NEGATIVE mg/dL
Ketones, ur: NEGATIVE mg/dL
Nitrite: NEGATIVE
Protein, ur: NEGATIVE mg/dL
Specific Gravity, Urine: 1.013 (ref 1.005–1.030)
pH: 6 (ref 5.0–8.0)

## 2015-04-15 LAB — PROTIME-INR
INR: 1.01 (ref 0.00–1.49)
Prothrombin Time: 13.5 seconds (ref 11.6–15.2)

## 2015-04-15 LAB — I-STAT CG4 LACTIC ACID, ED: Lactic Acid, Venous: 0.76 mmol/L (ref 0.5–2.0)

## 2015-04-15 LAB — MRSA PCR SCREENING: MRSA by PCR: NEGATIVE

## 2015-04-15 MED ORDER — ONDANSETRON HCL 4 MG PO TABS: 4.0000 mg | ORAL_TABLET | Freq: Four times a day (QID) | ORAL | Status: DC | PRN

## 2015-04-15 MED ORDER — PANTOPRAZOLE SODIUM 40 MG PO TBEC
40.0000 mg | DELAYED_RELEASE_TABLET | Freq: Every day | ORAL | Status: DC
  Administered 2015-04-16: 40 mg via ORAL
  Filled 2015-04-15: qty 1

## 2015-04-15 MED ORDER — PIPERACILLIN-TAZOBACTAM 3.375 G IVPB 30 MIN
3.3750 g | Freq: Once | INTRAVENOUS | Status: DC
  Administered 2015-04-15: 3.375 g via INTRAVENOUS
  Filled 2015-04-15: qty 50

## 2015-04-15 MED ORDER — ONDANSETRON HCL 4 MG/2ML IJ SOLN: 4.0000 mg | Freq: Four times a day (QID) | INTRAMUSCULAR | Status: DC | PRN

## 2015-04-15 MED ORDER — CYCLOSPORINE 0.05 % OP EMUL
1.0000 [drp] | Freq: Two times a day (BID) | OPHTHALMIC | Status: DC
  Administered 2015-04-15 – 2015-04-16 (×2): 1 [drp] via OPHTHALMIC
  Filled 2015-04-15 (×3): qty 1

## 2015-04-15 MED ORDER — ACETAMINOPHEN 650 MG RE SUPP: 650.0000 mg | Freq: Four times a day (QID) | RECTAL | Status: DC | PRN

## 2015-04-15 MED ORDER — HYDROCODONE-ACETAMINOPHEN 5-325 MG PO TABS
1.0000 | ORAL_TABLET | ORAL | Status: DC | PRN
Start: 2015-04-15 — End: 2015-04-16

## 2015-04-15 MED ORDER — SODIUM CHLORIDE 0.9 % IV BOLUS (SEPSIS)
1000.0000 mL | Freq: Once | INTRAVENOUS | Status: AC
  Administered 2015-04-15: 1000 mL via INTRAVENOUS

## 2015-04-15 MED ORDER — CALCIUM CARBONATE 1250 (500 CA) MG PO TABS
1.0000 | ORAL_TABLET | Freq: Two times a day (BID) | ORAL | Status: DC
  Administered 2015-04-16: 500 mg via ORAL
  Filled 2015-04-15: qty 1

## 2015-04-15 MED ORDER — VANCOMYCIN HCL IN DEXTROSE 1-5 GM/200ML-% IV SOLN
1000.0000 mg | INTRAVENOUS | Status: DC
  Administered 2015-04-15: 1000 mg via INTRAVENOUS
  Filled 2015-04-15: qty 200

## 2015-04-15 MED ORDER — LEVOTHYROXINE SODIUM 50 MCG PO TABS
50.0000 ug | ORAL_TABLET | Freq: Every day | ORAL | Status: DC
  Administered 2015-04-16: 50 ug via ORAL
  Filled 2015-04-15: qty 1

## 2015-04-15 MED ORDER — ACETAMINOPHEN 325 MG PO TABS: 650.0000 mg | ORAL_TABLET | Freq: Four times a day (QID) | ORAL | Status: DC | PRN

## 2015-04-15 MED ORDER — BISOPROLOL FUMARATE 5 MG PO TABS
5.0000 mg | ORAL_TABLET | Freq: Two times a day (BID) | ORAL | Status: DC
Start: 2015-04-15 — End: 2015-04-16
  Administered 2015-04-15 – 2015-04-16 (×2): 5 mg via ORAL
  Filled 2015-04-15 (×3): qty 1

## 2015-04-15 MED ORDER — BACITRACIN-POLYMYXIN B 500-10000 UNIT/GM OP OINT
TOPICAL_OINTMENT | Freq: Every day | OPHTHALMIC | Status: DC
  Administered 2015-04-15: 21:00:00 via OPHTHALMIC
  Filled 2015-04-15: qty 3.5

## 2015-04-15 MED ORDER — SIMVASTATIN 10 MG PO TABS
5.0000 mg | ORAL_TABLET | Freq: Every day | ORAL | Status: DC
  Administered 2015-04-15: 5 mg via ORAL
  Filled 2015-04-15: qty 1

## 2015-04-15 MED ORDER — CALCIUM CARBONATE 600 MG PO TABS: 600.0000 mg | ORAL_TABLET | Freq: Two times a day (BID) | ORAL | Status: DC

## 2015-04-15 MED ORDER — TRAMADOL HCL 50 MG PO TABS
50.0000 mg | ORAL_TABLET | Freq: Two times a day (BID) | ORAL | Status: DC
  Administered 2015-04-15 – 2015-04-16 (×2): 50 mg via ORAL
  Filled 2015-04-15 (×2): qty 1

## 2015-04-15 MED ORDER — WARFARIN SODIUM 5 MG PO TABS
5.0000 mg | ORAL_TABLET | Freq: Once | ORAL | Status: AC
  Administered 2015-04-15: 5 mg via ORAL
  Filled 2015-04-15: qty 1

## 2015-04-15 MED ORDER — WARFARIN - PHARMACIST DOSING INPATIENT
Freq: Every day | Status: DC
Start: 2015-04-16 — End: 2015-04-16

## 2015-04-15 NOTE — Progress Notes (Signed)
ANTICOAGULATION CONSULT NOTE - Initial Consult  Pharmacy Consult for Warfarin Indication: atrial fibrillation  No Known Allergies  Patient Measurements: Height: 5\' 7"  (170.2 cm) Weight: 100 lb (45.36 kg) IBW/kg (Calculated) : 61.6   Vital Signs: Temp: 97.6 F (36.4 C) (11/22 1843) Temp Source: Oral (11/22 1843) BP: 103/46 mmHg (11/22 1843) Pulse Rate: 69 (11/22 1843)  Labs:  Recent Labs  04/15/15 1432  HGB 9.0*  HCT 27.8*  PLT 201  LABPROT 13.5  INR 1.01  CREATININE 1.14*    Estimated Creatinine Clearance: 23 mL/min (by C-G formula based on Cr of 1.14).   Medical History: Past Medical History  Diagnosis Date  . Hypokalemia   . GERD (gastroesophageal reflux disease)   . Temporal arteritis (HCC)   . Atrial fibrillation (HCC)   . Hypothyroidism   . Hyponatremia   . Angina   . Shortness of breath   . Hip fracture (HCC)   . Pacemaker   . Hypertension   . Hyperlipemia   . Renal disorder     chronic kidney disorder     Assessment: [age] yoF known to pharmacy for vancomycin dosing for cellulitis on warfarin for atrial fibrillation, pharmacy asked to manage inpatient.  Patient resides at an assisted living facility and PTA warfarin dose reported as 3 mg daily except takes 4.5 mg on Mondays.  Last dose apparently was 11/21; however, INR checked in the ED is 1.01.  Goal of Therapy:  INR 2-3   Plan:  1.  Warfarin 5 mg po once tonight. 2.  Daily INR.  Clance Bollunyon, Lorenda Grecco 04/15/2015,6:59 PM
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
## 2015-04-15 NOTE — ED Notes (Signed)
Bed: WA06 Expected date:  Expected time:  Means of arrival:  Comments: ems 

## 2015-04-15 NOTE — ED Notes (Signed)
Patient transported to X-ray 
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
## 2015-04-15 NOTE — ED Notes (Addendum)
Pt from [facility]; per EMS staff primary Dr Sheela StackMessor referred pt to hospital after elevated temp and weakness were noted; EMS reports notes of decreased lung sounds; EMS repotrs [id number];  Pt ambulates with 2 person assist; hx elevated cholesterol; AV pacemaker; CKD; and swelling bilateral lower extremitites staff gave 650 tylenol and EMS gave 650 tylenol en route

## 2015-04-15 NOTE — Progress Notes (Signed)
ANTIBIOTIC CONSULT NOTE - INITIAL  Pharmacy Consult for Vancomycin Indication: cellulitis  No Known Allergies  Patient Measurements: Weight: 100 lb (45.36 kg)   Vital Signs: Temp: 100.4 F (38 C) (11/22 1558) Temp Source: Oral (11/22 1558) BP: 102/47 mmHg (11/22 1558) Pulse Rate: 72 (11/22 1558) Intake/Output from previous day:   Intake/Output from this shift:    Labs:  Recent Labs  04/15/15 1432  WBC 12.8*  HGB 9.0*  PLT 201  CREATININE 1.14*   CrCl cannot be calculated (Unknown ideal weight.). No results for input(s): VANCOTROUGH, VANCOPEAK, VANCORANDOM, GENTTROUGH, GENTPEAK, GENTRANDOM, TOBRATROUGH, TOBRAPEAK, TOBRARND, AMIKACINPEAK, AMIKACINTROU, AMIKACIN in the last 72 hours.   Microbiology: No results found for this or any previous visit (from the past 720 hour(s)).  Medical History: Past Medical History  Diagnosis Date  . Hypokalemia   . GERD (gastroesophageal reflux disease)   . Temporal arteritis   . Atrial fibrillation   . Hypothyroidism   . Hyponatremia   . Angina   . Shortness of breath   . Hip fracture   . Pacemaker   . Hypertension   . Hyperlipemia   . Renal disorder     chronic kidney disorder     Assessment: [age] yoF presents from NH with fever, weakness, swelling of bilateral lower extremities.  Zosyn 3.385g ordered x 1 in ED. Pharmacy consulted to dose vancomycin for cellulitis.  - WBC mildly elevated - Tc 100.4, pt given APAP at NH - SCr 1.14, CrCl~22 ml/min  Goal of Therapy:  Vancomycin trough level 10-15 mcg/ml  Plan:  1.  Vancomycin 1g IV q48h. 2.  F/u SCr, trough levels as indicated.  Clance Bollunyon, Gatlyn Lipari 04/15/2015,4:39 PM

## 2015-04-16 DIAGNOSIS — R509 Fever, unspecified: Secondary | ICD-10-CM | POA: Diagnosis present

## 2015-04-16 DIAGNOSIS — Z950 Presence of cardiac pacemaker: Secondary | ICD-10-CM

## 2015-04-16 DIAGNOSIS — N179 Acute kidney failure, unspecified: Secondary | ICD-10-CM | POA: Diagnosis present

## 2015-04-16 LAB — CBC
HEMATOCRIT: 26.7 % — AB (ref 36.0–46.0)
Hemoglobin: 8.6 g/dL — ABNORMAL LOW (ref 12.0–15.0)
MCH: 27.9 pg (ref 26.0–34.0)
MCHC: 32.2 g/dL (ref 30.0–36.0)
MCV: 86.7 fL (ref 78.0–100.0)
PLATELETS: 185 10*3/uL (ref 150–400)
RBC: 3.08 MIL/uL — AB (ref 3.87–5.11)
RDW: 14.5 % (ref 11.5–15.5)
WBC: 7.6 10*3/uL (ref 4.0–10.5)

## 2015-04-16 LAB — BASIC METABOLIC PANEL
ANION GAP: 8 (ref 5–15)
BUN: 29 mg/dL — ABNORMAL HIGH (ref 6–20)
CALCIUM: 7.5 mg/dL — AB (ref 8.9–10.3)
CO2: 23 mmol/L (ref 22–32)
Chloride: 108 mmol/L (ref 101–111)
Creatinine, Ser: 0.89 mg/dL (ref 0.44–1.00)
GFR, EST NON AFRICAN AMERICAN: 55 mL/min — AB (ref >60)
GLUCOSE: 96 mg/dL (ref 65–99)
POTASSIUM: 3.9 mmol/L (ref 3.5–5.1)
Sodium: 139 mmol/L (ref 135–145)

## 2015-04-16 LAB — PROTIME-INR
INR: 1.82 — AB (ref 0.00–1.49)
Prothrombin Time: 21.1 seconds — ABNORMAL HIGH (ref 11.6–15.2)

## 2015-04-16 MED ORDER — CEFUROXIME AXETIL 500 MG PO TABS: 500.0000 mg | ORAL_TABLET | Freq: Two times a day (BID) | ORAL | Status: DC

## 2015-04-16 MED ORDER — WARFARIN SODIUM 3 MG PO TABS
3.0000 mg | ORAL_TABLET | Freq: Once | ORAL | Status: DC
  Filled 2015-04-16: qty 1

## 2015-04-16 NOTE — Care Management Note (Signed)
Case Management Note  Patient Details  Name: Janice [name] MRN: [id number] Date of Birth: Jun 12, 1921  Subjective/Objective:               81 yo admitted with Protein-calorie malnutrition     Action/Plan: [facility]  Expected Discharge Date:   (unknown)               Expected Discharge Plan:  Home w Home Health Services  In-House Referral:     Discharge planning Services  CM Consult  Post Acute Care Choice:  Resumption of Svcs/PTA Provider Choice offered to:  Patient  DME Arranged:    DME Agency:     HH Arranged:  PT, OT, RN [facility]  Status of Service:  Completed, signed off  Medicare Important Message Given:    Date Medicare IM Given:    Medicare IM give by:    Date Additional Medicare IM Given:    Additional Medicare Important Message give by:     If discussed at Long Length of Stay Meetings, dates discussed:    Additional Comments: Pt with [facility] prior to admission. MD order written to resume HH services at [facility]. [facility] informed of continued need of services and discharge today. No other CM needs communicated. Bartholome BillCLEMENTS, Baudelio Karnes H, RN 04/16/2015, 10:53 AM
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
## 2015-04-16 NOTE — Progress Notes (Signed)
ANTICOAGULATION CONSULT NOTE - follow up  Pharmacy Consult for Warfarin Indication: atrial fibrillation  No Known Allergies  Patient Measurements: Height: 5\' 7"  (170.2 cm) Weight: 100 lb (45.36 kg) IBW/kg (Calculated) : 61.6   Vital Signs: Temp: 98.2 F (36.8 C) (11/23 0615) Temp Source: Oral (11/23 0615) BP: 105/46 mmHg (11/23 0615) Pulse Rate: 71 (11/23 0615)  Labs:  Recent Labs  04/15/15 1432 04/16/15 0353  HGB 9.0* 8.6*  HCT 27.8* 26.7*  PLT 201 185  LABPROT 13.5 21.1*  INR 1.01 1.82*  CREATININE 1.14* 0.89    Estimated Creatinine Clearance: 29.5 mL/min (by C-G formula based on Cr of 0.89).   Medical History: Past Medical History  Diagnosis Date  . Hypokalemia   . GERD (gastroesophageal reflux disease)   . Temporal arteritis (HCC)   . Atrial fibrillation (HCC)   . Hypothyroidism   . Hyponatremia   . Angina   . Shortness of breath   . Hip fracture (HCC)   . Pacemaker   . Hypertension   . Hyperlipemia   . Renal disorder     chronic kidney disorder     Assessment: [age] yoF known to pharmacy for vancomycin dosing for cellulitis on warfarin for atrial fibrillation, pharmacy asked to manage inpatient.  Patient resides at an assisted living facility and PTA warfarin dose reported as 3 mg daily except takes 4.5 mg on Mondays.  Last dose apparently was 11/21; however, INR checked in the ED is 1.01.  04/16/2015 INR  1.82 H/H low Plts WNL Diet ordered No drug interactions noted  Goal of Therapy:  INR 2-3   Plan:  1.  Warfarin 3 mg po once tonight. 2.  Daily INR.  Arley Phenixllen Chardonnay Holzmann RPh 04/16/2015, 8:31 AM Pager [phone]

## 2015-04-16 NOTE — Progress Notes (Signed)
CSW received referral that pt admitted from [facility].   CSW contacted facility and facility stated that pt is a resident in [place] section. CSW notified that pt would be returning to [place] today and facility stated that they would not have transportation to pick pt up and recommended [facility]. [facility] stated that they needed resumption of care [facility] order. CSW notified MD.   CSW met with pt at bedside. Pt confirmed [facility] was best option for transport. CSW discussed with RN and arranged ambulance transport via [facility].   No further social work needs identified at this time.  CSW signing off.   Alison Murray, MSW, [place] Work [phone]

## 2015-04-20 LAB — CULTURE, BLOOD (ROUTINE X 2)
Culture: NO GROWTH
Culture: NO GROWTH

## 2015-05-14 DIAGNOSIS — I441 Atrioventricular block, second degree: Secondary | ICD-10-CM | POA: Diagnosis not present

## 2015-07-23 DIAGNOSIS — I441 Atrioventricular block, second degree: Secondary | ICD-10-CM | POA: Diagnosis not present

## 2015-08-13 ENCOUNTER — Encounter: Payer: Self-pay | Admitting: Internal Medicine

## 2016-06-30 ENCOUNTER — Encounter (HOSPITAL_COMMUNITY): Payer: Self-pay | Admitting: *Deleted

## 2016-06-30 ENCOUNTER — Inpatient Hospital Stay (HOSPITAL_COMMUNITY)
Admission: EM | Admit: 2016-06-30 | Discharge: 2016-07-04 | DRG: 871 | Disposition: A | Discharge status: Home Health | Payer: Medicare Other | Attending: Internal Medicine | Admitting: Internal Medicine

## 2016-06-30 ENCOUNTER — Emergency Department (HOSPITAL_COMMUNITY): Payer: Medicare Other

## 2016-06-30 DIAGNOSIS — Z7901 Long term (current) use of anticoagulants: Secondary | ICD-10-CM | POA: Diagnosis not present

## 2016-06-30 DIAGNOSIS — M7989 Other specified soft tissue disorders: Secondary | ICD-10-CM | POA: Diagnosis not present

## 2016-06-30 DIAGNOSIS — I480 Paroxysmal atrial fibrillation: Secondary | ICD-10-CM

## 2016-06-30 DIAGNOSIS — Z681 Body mass index (BMI) 19 or less, adult: Secondary | ICD-10-CM | POA: Diagnosis not present

## 2016-06-30 DIAGNOSIS — Z66 Do not resuscitate: Secondary | ICD-10-CM | POA: Diagnosis present

## 2016-06-30 DIAGNOSIS — J9601 Acute respiratory failure with hypoxia: Secondary | ICD-10-CM | POA: Diagnosis present

## 2016-06-30 DIAGNOSIS — M791 Myalgia: Secondary | ICD-10-CM | POA: Diagnosis present

## 2016-06-30 DIAGNOSIS — J189 Pneumonia, unspecified organism: Secondary | ICD-10-CM | POA: Diagnosis not present

## 2016-06-30 DIAGNOSIS — I10 Essential (primary) hypertension: Secondary | ICD-10-CM | POA: Diagnosis present

## 2016-06-30 DIAGNOSIS — E43 Unspecified severe protein-calorie malnutrition: Secondary | ICD-10-CM

## 2016-06-30 DIAGNOSIS — E860 Dehydration: Secondary | ICD-10-CM | POA: Diagnosis present

## 2016-06-30 DIAGNOSIS — T84091D Other mechanical complication of internal left hip prosthesis, subsequent encounter: Secondary | ICD-10-CM | POA: Diagnosis not present

## 2016-06-30 DIAGNOSIS — A419 Sepsis, unspecified organism: Secondary | ICD-10-CM | POA: Diagnosis not present

## 2016-06-30 DIAGNOSIS — K219 Gastro-esophageal reflux disease without esophagitis: Secondary | ICD-10-CM | POA: Diagnosis present

## 2016-06-30 DIAGNOSIS — I482 Chronic atrial fibrillation: Secondary | ICD-10-CM | POA: Diagnosis present

## 2016-06-30 DIAGNOSIS — R410 Disorientation, unspecified: Secondary | ICD-10-CM | POA: Diagnosis present

## 2016-06-30 DIAGNOSIS — E785 Hyperlipidemia, unspecified: Secondary | ICD-10-CM | POA: Diagnosis present

## 2016-06-30 DIAGNOSIS — M316 Other giant cell arteritis: Secondary | ICD-10-CM | POA: Diagnosis present

## 2016-06-30 DIAGNOSIS — Z950 Presence of cardiac pacemaker: Secondary | ICD-10-CM

## 2016-06-30 DIAGNOSIS — N179 Acute kidney failure, unspecified: Secondary | ICD-10-CM | POA: Diagnosis present

## 2016-06-30 DIAGNOSIS — Z79899 Other long term (current) drug therapy: Secondary | ICD-10-CM | POA: Diagnosis not present

## 2016-06-30 DIAGNOSIS — E039 Hypothyroidism, unspecified: Secondary | ICD-10-CM | POA: Diagnosis present

## 2016-06-30 DIAGNOSIS — D649 Anemia, unspecified: Secondary | ICD-10-CM | POA: Diagnosis present

## 2016-06-30 DIAGNOSIS — I4891 Unspecified atrial fibrillation: Secondary | ICD-10-CM | POA: Diagnosis present

## 2016-06-30 DIAGNOSIS — R0602 Shortness of breath: Secondary | ICD-10-CM

## 2016-06-30 DIAGNOSIS — R8290 Unspecified abnormal findings in urine: Secondary | ICD-10-CM | POA: Diagnosis present

## 2016-06-30 DIAGNOSIS — Z90710 Acquired absence of both cervix and uterus: Secondary | ICD-10-CM | POA: Diagnosis not present

## 2016-06-30 LAB — URINALYSIS, ROUTINE W REFLEX MICROSCOPIC
BILIRUBIN URINE: NEGATIVE
Glucose, UA: NEGATIVE mg/dL
Hgb urine dipstick: NEGATIVE
KETONES UR: NEGATIVE mg/dL
NITRITE: NEGATIVE
PROTEIN: NEGATIVE mg/dL
Specific Gravity, Urine: 1.019 (ref 1.005–1.030)
pH: 5 (ref 5.0–8.0)

## 2016-06-30 LAB — COMPREHENSIVE METABOLIC PANEL
ALBUMIN: 3.4 g/dL — AB (ref 3.5–5.0)
ALT: 13 U/L — ABNORMAL LOW (ref 14–54)
ANION GAP: 13 (ref 5–15)
AST: 24 U/L (ref 15–41)
Alkaline Phosphatase: 75 U/L (ref 38–126)
BUN: 30 mg/dL — ABNORMAL HIGH (ref 6–20)
CALCIUM: 9.1 mg/dL (ref 8.9–10.3)
CO2: 26 mmol/L (ref 22–32)
Chloride: 99 mmol/L — ABNORMAL LOW (ref 101–111)
Creatinine, Ser: 1.08 mg/dL — ABNORMAL HIGH (ref 0.44–1.00)
GFR calc non Af Amer: 43 mL/min — ABNORMAL LOW (ref >60)
GFR, EST AFRICAN AMERICAN: 50 mL/min — AB (ref >60)
GLUCOSE: 129 mg/dL — AB (ref 65–99)
POTASSIUM: 3.8 mmol/L (ref 3.5–5.1)
SODIUM: 138 mmol/L (ref 135–145)
Total Bilirubin: 0.4 mg/dL (ref 0.3–1.2)
Total Protein: 5.8 g/dL — ABNORMAL LOW (ref 6.5–8.1)

## 2016-06-30 LAB — PROTIME-INR
INR: 1.07
INR: 1.13
PROTHROMBIN TIME: 14 s (ref 11.4–15.2)
Prothrombin Time: 14.6 seconds (ref 11.4–15.2)

## 2016-06-30 LAB — PROCALCITONIN: Procalcitonin: 0.28 ng/mL

## 2016-06-30 LAB — CBC WITH DIFFERENTIAL/PLATELET
BASOS ABS: 0 10*3/uL (ref 0.0–0.1)
Basophils Relative: 0 %
Eosinophils Absolute: 0 10*3/uL (ref 0.0–0.7)
Eosinophils Relative: 0 %
HEMATOCRIT: 37.1 % (ref 36.0–46.0)
HEMOGLOBIN: 12 g/dL (ref 12.0–15.0)
LYMPHS PCT: 7 %
Lymphs Abs: 1 10*3/uL (ref 0.7–4.0)
MCH: 28.8 pg (ref 26.0–34.0)
MCHC: 32.3 g/dL (ref 30.0–36.0)
MCV: 89.2 fL (ref 78.0–100.0)
MONO ABS: 0.7 10*3/uL (ref 0.1–1.0)
MONOS PCT: 5 %
NEUTROS ABS: 12.7 10*3/uL — AB (ref 1.7–7.7)
NEUTROS PCT: 88 %
Platelets: 176 10*3/uL (ref 150–400)
RBC: 4.16 MIL/uL (ref 3.87–5.11)
RDW: 14 % (ref 11.5–15.5)
WBC: 14.4 10*3/uL — ABNORMAL HIGH (ref 4.0–10.5)

## 2016-06-30 LAB — STREP PNEUMONIAE URINARY ANTIGEN: Strep Pneumo Urinary Antigen: NEGATIVE

## 2016-06-30 LAB — LACTIC ACID, PLASMA
Lactic Acid, Venous: 2.7 mmol/L (ref 0.5–1.9)
Lactic Acid, Venous: 1.7 mmol/L (ref 0.5–1.9)

## 2016-06-30 LAB — I-STAT CG4 LACTIC ACID, ED
LACTIC ACID, VENOUS: 2.06 mmol/L — AB (ref 0.5–1.9)
Lactic Acid, Venous: 2.18 mmol/L (ref 0.5–1.9)

## 2016-06-30 LAB — GLUCOSE, CAPILLARY: Glucose-Capillary: 107 mg/dL — ABNORMAL HIGH (ref 65–99)

## 2016-06-30 LAB — INFLUENZA PANEL BY PCR (TYPE A & B)
INFLAPCR: NEGATIVE
INFLBPCR: NEGATIVE

## 2016-06-30 MED ORDER — GATIFLOXACIN 0.5 % OP SOLN
1.0000 [drp] | Freq: Every morning | OPHTHALMIC | Status: DC
  Administered 2016-07-01 – 2016-07-04 (×4): 1 [drp] via OPHTHALMIC
  Filled 2016-06-30: qty 2.5

## 2016-06-30 MED ORDER — ACETAMINOPHEN 500 MG PO TABS
1000.0000 mg | ORAL_TABLET | Freq: Once | ORAL | Status: AC
  Administered 2016-06-30: 1000 mg via ORAL
  Filled 2016-06-30: qty 2

## 2016-06-30 MED ORDER — LEVOTHYROXINE SODIUM 50 MCG PO TABS: 50.0000 ug | ORAL_TABLET | Freq: Every day | ORAL | Status: DC

## 2016-06-30 MED ORDER — SODIUM CHLORIDE 0.9 % IV BOLUS (SEPSIS)
1000.0000 mL | Freq: Once | INTRAVENOUS | Status: AC
  Administered 2016-06-30: 1000 mL via INTRAVENOUS

## 2016-06-30 MED ORDER — SODIUM CHLORIDE 0.9% FLUSH
3.0000 mL | Freq: Two times a day (BID) | INTRAVENOUS | Status: DC
  Administered 2016-06-30 – 2016-07-04 (×8): 3 mL via INTRAVENOUS

## 2016-06-30 MED ORDER — SODIUM CHLORIDE 0.9 % IV SOLN
INTRAVENOUS | Status: DC
  Administered 2016-06-30 – 2016-07-01 (×2): via INTRAVENOUS

## 2016-06-30 MED ORDER — PIPERACILLIN-TAZOBACTAM 3.375 G IVPB
3.3750 g | Freq: Three times a day (TID) | INTRAVENOUS | Status: DC
  Administered 2016-06-30 – 2016-07-04 (×12): 3.375 g via INTRAVENOUS
  Filled 2016-06-30 (×14): qty 50

## 2016-06-30 MED ORDER — SODIUM CHLORIDE 0.9 % IV BOLUS (SEPSIS)
500.0000 mL | Freq: Once | INTRAVENOUS | Status: AC
  Administered 2016-06-30: 500 mL via INTRAVENOUS

## 2016-06-30 MED ORDER — CYCLOSPORINE 0.05 % OP EMUL
1.0000 [drp] | Freq: Two times a day (BID) | OPHTHALMIC | Status: DC
  Administered 2016-06-30 – 2016-07-04 (×8): 1 [drp] via OPHTHALMIC
  Filled 2016-06-30 (×8): qty 1

## 2016-06-30 MED ORDER — ONDANSETRON HCL 4 MG PO TABS: 4.0000 mg | ORAL_TABLET | Freq: Four times a day (QID) | ORAL | Status: DC | PRN

## 2016-06-30 MED ORDER — ACETAMINOPHEN 325 MG PO TABS
650.0000 mg | ORAL_TABLET | Freq: Four times a day (QID) | ORAL | Status: DC | PRN
  Administered 2016-06-30: 650 mg via ORAL
  Filled 2016-06-30: qty 2

## 2016-06-30 MED ORDER — LEVOTHYROXINE SODIUM 50 MCG PO TABS
50.0000 ug | ORAL_TABLET | Freq: Every day | ORAL | Status: DC
  Administered 2016-07-01 – 2016-07-04 (×4): 50 ug via ORAL
  Filled 2016-06-30 (×4): qty 1

## 2016-06-30 MED ORDER — SIMVASTATIN 10 MG PO TABS
5.0000 mg | ORAL_TABLET | Freq: Every day | ORAL | Status: DC
  Administered 2016-06-30 – 2016-07-03 (×4): 5 mg via ORAL
  Filled 2016-06-30 (×4): qty 1

## 2016-06-30 MED ORDER — WARFARIN SODIUM 5 MG PO TABS
5.0000 mg | ORAL_TABLET | ORAL | Status: DC
  Filled 2016-06-30: qty 1

## 2016-06-30 MED ORDER — VANCOMYCIN HCL 500 MG IV SOLR
500.0000 mg | INTRAVENOUS | Status: DC
  Administered 2016-07-01 – 2016-07-04 (×4): 500 mg via INTRAVENOUS
  Filled 2016-06-30 (×5): qty 500

## 2016-06-30 MED ORDER — ACETAMINOPHEN 650 MG RE SUPP: 650.0000 mg | Freq: Four times a day (QID) | RECTAL | Status: DC | PRN

## 2016-06-30 MED ORDER — VANCOMYCIN HCL IN DEXTROSE 1-5 GM/200ML-% IV SOLN
1000.0000 mg | Freq: Once | INTRAVENOUS | Status: AC
  Administered 2016-06-30: 1000 mg via INTRAVENOUS
  Filled 2016-06-30: qty 200

## 2016-06-30 MED ORDER — ENOXAPARIN SODIUM 40 MG/0.4ML ~~LOC~~ SOLN
40.0000 mg | SUBCUTANEOUS | Status: DC
  Administered 2016-06-30: 40 mg via SUBCUTANEOUS
  Filled 2016-06-30: qty 0.4

## 2016-06-30 MED ORDER — WARFARIN - PHARMACIST DOSING INPATIENT: Freq: Every day | Status: DC

## 2016-06-30 MED ORDER — ONDANSETRON HCL 4 MG/2ML IJ SOLN: 4.0000 mg | Freq: Four times a day (QID) | INTRAMUSCULAR | Status: DC | PRN

## 2016-06-30 MED ORDER — PIPERACILLIN-TAZOBACTAM 3.375 G IVPB 30 MIN
3.3750 g | Freq: Once | INTRAVENOUS | Status: AC
  Administered 2016-06-30: 3.375 g via INTRAVENOUS
  Filled 2016-06-30: qty 50

## 2016-06-30 MED ORDER — PANTOPRAZOLE SODIUM 40 MG PO TBEC
40.0000 mg | DELAYED_RELEASE_TABLET | Freq: Every day | ORAL | Status: DC
  Administered 2016-06-30 – 2016-07-04 (×5): 40 mg via ORAL
  Filled 2016-06-30 (×5): qty 1

## 2016-06-30 NOTE — ED Triage Notes (Signed)
Patient presents to ed via [facility] states she hasn't felt well today c/o cough and generalzied aching. C/o being cold

## 2016-06-30 NOTE — ED Notes (Signed)
Shown cg4 lactic acid to dr.knott

## 2016-06-30 NOTE — ED Notes (Signed)
Admitting at bedside 

## 2016-06-30 NOTE — ED Provider Notes (Signed)
TIME SEEN: 4:00 AM  CHIEF COMPLAINT: Fever, cough  HPI: Patient is a 83 year old female with history of atrial fibrillation on Coumadin, hypothyroidism, pacemaker, chronic kidney disease who presents from [facility] with fever and cough, body aches. Reports she has had influenza vaccination 2 this year. No known sick contacts. Denies chest pain. Denies vomiting or diarrhea. No headache, neck pain or neck stiffness. Has had pneumonia in the past. Does not wear oxygen chronically.  PCP - McKenzie   Nona DellLynn Munday, niece - [phone]  ROS: See HPI Constitutional:  fever  Eyes: no drainage  ENT: no runny nose   Cardiovascular:  no chest pain  Resp:  SOB  GI: no vomiting GU: no dysuria Integumentary: no rash  Allergy: no hives  Musculoskeletal: no leg swelling  Neurological: no slurred speech ROS otherwise negative  PAST MEDICAL HISTORY/PAST SURGICAL HISTORY:  Past Medical History:  Diagnosis Date  . Angina   . Atrial fibrillation (HCC)   . GERD (gastroesophageal reflux disease)   . Hip fracture (HCC)   . Hyperlipemia   . Hypertension   . Hypokalemia   . Hyponatremia   . Hypothyroidism   . Pacemaker   . Renal disorder    chronic kidney disorder  . Shortness of breath   . Temporal arteritis (HCC)     MEDICATIONS:  Prior to Admission medications   Medication Sig Start Date End Date Taking? Authorizing Provider  bacitracin ophthalmic ointment Place 1 application into both eyes at bedtime. Apply to eyes sparingly.    Historical Provider, MD  Besifloxacin HCl (BESIVANCE) 0.6 % SUSP Place 1 drop into the left eye every morning.    Historical Provider, MD  beta carotene w/minerals (OCUVITE) tablet Take by mouth daily.     Historical Provider, MD  bisoprolol (ZEBETA) 5 MG tablet Take 5 mg by mouth 2 (two) times daily.     Historical Provider, MD  calcium carbonate (OS-CAL) 600 MG TABS Take 600 mg by mouth 2 (two) times daily with a meal. Break in half.     Historical Provider, MD  cefUROXime (CEFTIN) 500 MG tablet Take 1 tablet (500 mg total) by mouth 2 (two) times daily with a meal. 04/16/15   Clydia LlanoMutaz Elmahi, MD  furosemide (LASIX) 20 MG tablet Take 60 mg by mouth daily.    Historical Provider, MD  HYDROcodone-acetaminophen (NORCO/VICODIN) 5-325 MG per tablet Take 0.5-1 tablets by mouth every 4 (four) hours as needed. As needed for pain, may increase to one tab if needed Patient taking differently: Take 0.5-1 tablets by mouth 2 (two) times daily as needed (pain). As needed for pain, may take an additional 1/2 tab if needed 10/18/12   Marinda ElkAbraham Feliz Ortiz, MD  levothyroxine (SYNTHROID, LEVOTHROID) 50 MCG tablet Take 50 mcg by mouth daily.    Historical Provider, MD  omeprazole (PRILOSEC) 20 MG capsule Take 20 mg by mouth daily.    Historical Provider, MD  RESTASIS 0.05 % ophthalmic emulsion Place 1 drop into both eyes 2 (two) times daily. 03/05/15   Historical Provider, MD  simvastatin (ZOCOR) 5 MG tablet Take 5 mg by mouth at bedtime.      Historical Provider, MD  traMADol (ULTRAM) 50 MG tablet Take 50 mg by mouth 2 (two) times daily. 03/10/15   Historical Provider, MD  warfarin (COUMADIN) 3 MG tablet Take 3-4.5 mg by mouth daily. Take 3 mg daily except on Monday.  On Monday, take 4.5 mg.    Historical Provider, MD  ALLERGIES:  No Known Allergies  SOCIAL HISTORY:  Social History  Substance Use Topics  . Smoking status: Never Smoker  . Smokeless tobacco: Never Used  . Alcohol use No    FAMILY HISTORY: Family History  Problem Relation Age of Onset  . Breast cancer Mother     EXAM: BP 104/55 (BP Location: Right Arm)   Pulse 85   Temp 101.9 F (38.8 C) (Rectal)   SpO2 97%  CONSTITUTIONAL: Alert and oriented and responds appropriately to questions. Elderly, febrile, chronically ill-appearing HEAD: Normocephalic EYES: Conjunctivae clear, PERRL, EOMI ENT: normal nose; no rhinorrhea; moist mucous membranes NECK: Supple, no meningismus,  no nuchal rigidity, no LAD  CARD: RRR; S1 and S2 appreciated; no murmurs, no clicks, no rubs, no gallops RESP: Normal chest excursion without splinting or tachypnea; breath sounds clear and equal bilaterally; no wheezes, no rhonchi, no rales, no hypoxia or respiratory distress, speaking full sentences ABD/GI: Normal bowel sounds; non-distended; soft, non-tender, no rebound, no guarding, no peritoneal signs, no hepatosplenomegaly BACK:  The back appears normal and is non-tender to palpation, there is no CVA tenderness EXT: Normal ROM in all joints; non-tender to palpation; no edema; normal capillary refill; no cyanosis, no calf tenderness or swelling    SKIN: Normal color for age and race; warm; no rash NEURO: Moves all extremities equally PSYCH: The patient's mood and manner are appropriate. Grooming and personal hygiene are appropriate.  MEDICAL DECISION MAKING: Patient here with fever, mild hypotension with systolics in the 90s. Likely influenza versus pneumonia. We'll obtain labs, cultures, chest x-ray, urine, flu swab. We'll give IV fluids and broad-spectrum antibiotics. I feel she will need admission. I have left a message with her niece who is listed as her emergency contact. Patient agrees with admission.  ED PROGRESS: 6:00 AM  Patient's labs show leukocytosis with left shift. Lactate mildly elevated. INR subtherapeutic. Cultures pending. X-ray shows bilateral pneumonia. Treated with broad-spectrum antibiotics and IV fluids. Discussed with hospitalist Dr. Toniann Fail who agrees on admission to inpatient, stepdown. I will place holding orders per his request.    I reviewed all nursing notes, vitals, pertinent old records, EKGs, labs, imaging (as available).    EKG Interpretation  Date/Time:  Wednesday June 30 2016 03:44:31 EST Ventricular Rate:  85 PR Interval:    QRS Duration: 174 QT Interval:  396 QTC Calculation: 471 R Axis:   -85 Text Interpretation:  VENTRICULAR PACED RHYTHM  No significant change since last tracing Confirmed by Diora Bellizzi,  DO, Sunshine Mackowski ([id number]) on 06/30/2016 3:54:05 AM         CRITICAL CARE Performed by: Raelyn Number   Total critical care time: 45 minutes  Critical care time was exclusive of separately billable procedures and treating other patients.  Critical care was necessary to treat or prevent imminent or life-threatening deterioration.  Critical care was time spent personally by me on the following activities: development of treatment plan with patient and/or surrogate as well as nursing, discussions with consultants, evaluation of patient's response to treatment, examination of patient, obtaining history from patient or surrogate, ordering and performing treatments and interventions, ordering and review of laboratory studies, ordering and review of radiographic studies, pulse oximetry and re-evaluation of patient's condition.     Layla Maw Nekeshia Lenhardt, DO 06/30/16 [phone]

## 2016-06-30 NOTE — ED Notes (Signed)
Pt repositioned and hooked up to monitor. Pt continues to pull off monitoring equipment.

## 2016-06-30 NOTE — ED Notes (Signed)
Helped PT to on bedpan. PT tolerated it well.

## 2016-06-30 NOTE — Progress Notes (Signed)
Pharmacy Antibiotic Note  Janice [name] is a 83 y.o. female admitted on 06/30/2016 with sepsis.  Pharmacy has been consulted for Vancomycin and Zosyn dosing. Estimated CrCl ~23 ml/min  Pt received Zosyn 3.375gm and Vanc 1gm in ED ~0500  Plan: Zosyn 3.375gm IV q8h - doses over 4 hours Vancomycin 500mg  IV q24h Will f/u micro data, renal function, and pt's clinical condition Vanc trough prn    Temp (24hrs), Avg:101.9 F (38.8 C), Min:101.9 F (38.8 C), Max:101.9 F (38.8 C)   Recent Labs Lab 06/30/16 0437 06/30/16 0452  WBC 14.4*  --   CREATININE 1.08*  --   LATICACIDVEN  --  2.06*    CrCl cannot be calculated (Unknown ideal weight.).    No Known Allergies  Antimicrobials this admission: 2/7 Vanc >>  2/7 Zosyn >>   Dose adjustments this admission: n/a  Microbiology results: 2/7 BCx x2:   Sputum:    Thank you for allowing pharmacy to be a part of this patient's care.  Christoper Fabianaron Conor Filsaime, PharmD, BCPS Clinical pharmacist, pager [phone] 06/30/2016 6:30 AM

## 2016-06-30 NOTE — Progress Notes (Addendum)
Patient remains confused, restless. Attempting to get out of bed, pulling at lines and tubing as well as pulling off BP cuff and telemetry leads. Nurse attempted to give patient her warfarin dose and patient told nurse she no longer takes this medication and instead takes aspirin. I have asked pharmacy to assist in clarifying. Went back to assess patient more closely and noticed right lower extremity more swollen from knee to ankle. We'll obtain lower extremity duplex. Will also order Recruitment consultantsafety sitter.  1206 pm: PCT 0.28; UA c/w UTI- obtain urine culture- current anbx coverage is appropriate for GU infections  1234: Pharmacist clarified no longer on warfarin and has received new preadmit drug list and will update. Also [facility]. Appears to have acute delirium. May need PT/OT eval prior to dc  1550: Has stabilized clinically. BP 110/58, heart rate 85, respirations 15, O2 saturations 95% on 2 L. Has been evaluated by attending M.D. and will change from stepdown to telemetry status. Repeat lactic acid pending collection since 10 AM. I have reordered stat one-time collection.  Junious SilkAllison Jaclin Finks, ANP

## 2016-06-30 NOTE — Progress Notes (Deleted)
ANTICOAGULATION CONSULT NOTE - Initial Consult  Pharmacy Consult for warfarin Indication: atrial fibrillation  No Known Allergies  Patient Measurements:    Vital Signs: Temp: 98.3 F (36.8 C) (02/07 0647) Temp Source: Oral (02/07 0647) BP: 90/55 (02/07 0715) Pulse Rate: 70 (02/07 0715)  Labs:  Recent Labs  06/30/16 0437 06/30/16 0830  HGB 12.0  --   HCT 37.1  --   PLT 176  --   LABPROT 14.0 14.6  INR 1.07 1.13  CREATININE 1.08*  --     CrCl cannot be calculated (Unknown ideal weight.).   Medical History: Past Medical History:  Diagnosis Date  . Angina   . Atrial fibrillation (HCC)   . GERD (gastroesophageal reflux disease)   . Hip fracture (HCC)   . Hyperlipemia   . Hypertension   . Hypokalemia   . Hyponatremia   . Hypothyroidism   . Pacemaker   . Renal disorder    chronic kidney disorder  . Shortness of breath   . Temporal arteritis (HCC)    Assessment: [age] yof presented to the ED with cough, body aches and fever. She in on chronic coumadin for afib but INR is subtherapeutic at 1.07. H/H and platelets are WNL and no bleeding noted.   Goal of Therapy:  INR 2-3 Monitor platelets by anticoagulation protocol: Yes   Plan:  Warfarin 5mg  PO x 1 now Daily INR  Janice [name], Janice [name] 06/30/2016,9:01 AM

## 2016-06-30 NOTE — Progress Notes (Signed)
New pt admission from ED. Pt brought to the floor in stable condition with lactic acid trending up. Order is placed for repeat lactic acid draw. Vitals taken. Initial Assessment done. All immediate pertinent needs to patient addressed. Patient Guide given to patient. Important safety instructions relating to hospitalization reviewed with patient. Patient verbalized understanding.  Pt has a safety sittRecruitment consultanter at bedside. Will continue to monitor pt.  Wynona NeatJessica Shaneice Barsanti RN

## 2016-06-30 NOTE — Progress Notes (Signed)
Paged MD for temp of 100.2. Given tylenol and lactic acid is being drawn now.

## 2016-06-30 NOTE — H&P (Signed)
History and Physical    Janice [name]:[id number] DOB: 1921/11/16 DOA: 06/30/2016   PCP: Thressa Sheller, MD   Patient coming from/Resides with: SNF  Admission status: Inpatient/SDU-medically necessary to stay a minimum 2 midnights to rule out impending and/or unexpected changes in physiologic status that may differ from initial evaluation performed in the ER and/or at time of admission. Patient presents with sepsis physiology in setting of bilateral pneumonia process consistent with HCAP and possible underlying influenza. Patient will require IV fluids, IV antibiotics (broad-spectrum), close monitoring of hemodynamic status, telemetry monitoring.  Chief Complaint: Cough and generalized myalgia  HPI: Janice [name] is a 83 y.o. female with medical history significant for hypothyroidism, chronic atrial fibrillation on warfarin, history of temporal arteritis, severe protein calorie malnutrition, pacemaker, normocytic anemia and GERD. Apparently she has had flu vaccination this year. No known recent sick contacts. Denied chest pain, vomiting diarrhea or abdominal pain. No headache neck pain or neck stiffness. He was found to be hypoxemic with room air saturations of 81%.  ED Course:  Vital Signs: BP 90/55   Pulse 70   Temp 98.3 F (36.8 C) (Oral)   Resp 17   SpO2 95%  2 view chest x-ray: Right greater than left lung base consolidations consistent with pneumonia Lab Data: Sodium 138, potassium 3.8, chloride 99, CO2 26, glucose 129, BUN 30, creatinine 1.08, albumin 3.4, lactic acid 2.06 and 2.18, white count 14,400 with neutrophils 88% and absolute neutrophils 12.7%, hemoglobin 12, platelet 176,000, lacks normal, influenza A/B-, blood cultures obtained in the ER Medications and treatments: Normal saline bolus 2000 mL, Zosyn 3.375 g IV 1, vancomycin 1 g IV 1, Tylenol thousand milligrams 1   Review of Systems:  In addition to the HPI above,  No reported Fever No Headache, changes with  Vision or hearing, new weakness, tingling, numbness in any extremity, dizziness, dysarthria or word finding difficulty, gait disturbance or imbalance, tremors or seizure activity No problems swallowing food or Liquids, indigestion/reflux, choking or coughing while eating, abdominal pain with or after eating No Chest pain, Shortness of Breath, palpitations, orthopnea or DOE No Abdominal pain, N/V, melena,hematochezia, dark tarry stools, constipation No dysuria, malodorous urine, hematuria or flank pain No new skin rashes, lesions, masses or bruises, No new joint pains, aches, swelling or redness No recent unintentional weight gain or loss No polyuria, polydypsia or polyphagia   Past Medical History:  Diagnosis Date  . Angina   . Atrial fibrillation ([place])   . GERD (gastroesophageal reflux disease)   . Hip fracture ([place])   . Hyperlipemia   . Hypertension   . Hypokalemia   . Hyponatremia   . Hypothyroidism   . Pacemaker   . Renal disorder    chronic kidney disorder  . Shortness of breath   . Temporal arteritis [facility])     Past Surgical History:  Procedure Laterality Date  . ABDOMINAL HYSTERECTOMY    . CATARACT EXTRACTION    . CHOLECYSTECTOMY    . INSERT / REPLACE / REMOVE PACEMAKER    . [employer]    . MV repair     at [facility]  . ORIF ACETABULAR FRACTURE     Right  . PPM      Social History   Social History  . Marital status: Single    Spouse name: N/A  . Number of children: N/A  . Years of education: N/A   Occupational History  . Not on file.   Social History Main Topics  .  Smoking status: Never Smoker  . Smokeless tobacco: Never Used  . Alcohol use No  . Drug use: No  . Sexual activity: Not Currently    Birth control/ protection: Post-menopausal   Other Topics Concern  . Not on file   Social History Narrative  . No narrative on file    Mobility: Rolling walker Work history: Retired   No Known Allergies  Family History  Problem Relation  Age of Onset  . Breast cancer Mother      Prior to Admission medications   Medication Sig Start Date End Date Taking? Authorizing Provider  bacitracin ophthalmic ointment Place 1 application into both eyes at bedtime. Apply to eyes sparingly.   Yes Historical Provider, MD  Besifloxacin HCl (BESIVANCE) 0.6 % SUSP Place 1 drop into the left eye every morning.   Yes Historical Provider, MD  beta carotene w/minerals (OCUVITE) tablet Take 1 tablet by mouth daily.    Yes Historical Provider, MD  bisoprolol (ZEBETA) 5 MG tablet Take 5 mg by mouth 2 (two) times daily.    Yes Historical Provider, MD  calcium carbonate (OS-CAL) 600 MG TABS Take 600 mg by mouth 2 (two) times daily with a meal. Break in half.   Yes Historical Provider, MD  furosemide (LASIX) 20 MG tablet Take 60 mg by mouth daily.   Yes Historical Provider, MD  HYDROcodone-acetaminophen (NORCO/VICODIN) 5-325 MG per tablet Take 0.5-1 tablets by mouth every 4 (four) hours as needed. As needed for pain, may increase to one tab if needed Patient taking differently: Take 0.5-1 tablets by mouth 2 (two) times daily as needed (pain). As needed for pain, may take an additional 1/2 tab if needed 10/18/12  Yes Janice Cousins, MD  levothyroxine (SYNTHROID, LEVOTHROID) 50 MCG tablet Take 50 mcg by mouth daily.   Yes Historical Provider, MD  omeprazole (PRILOSEC) 20 MG capsule Take 20 mg by mouth daily.   Yes Historical Provider, MD  RESTASIS 0.05 % ophthalmic emulsion Place 1 drop into both eyes 2 (two) times daily. 03/05/15  Yes Historical Provider, MD  simvastatin (ZOCOR) 5 MG tablet Take 5 mg by mouth at bedtime.     Yes Historical Provider, MD  warfarin (COUMADIN) 3 MG tablet Take 3 mg by mouth See admin instructions. Take on Monday, Tuesday, Thursday, Saturday, Sunday   Yes Historical Provider, MD  zoledronic acid (RECLAST) 5 MG/100ML SOLN injection Inject 5 mg into the vein every 30 (thirty) days.   Yes Historical Provider, MD    Physical  Exam: Vitals:   06/30/16 0545 06/30/16 0600 06/30/16 0647 06/30/16 0715  BP: (!) 96/42 (!) _0  Pulse:   70 70  Resp: _1 Temp:   98.3 F (36.8 C)   TempSrc:   Oral   SpO2:   100% 95%      Constitutional: NAD, Mildly anxious, comfortable-reports is thirsty Eyes: PERRL, lids and conjunctivae normal ENMT: Mucous membranes are dry. Posterior pharynx clear of any exudate or lesions. Age-appropriate dentition.  Neck: normal, supple, no masses, no thyromegaly Respiratory: Coarse to auscultation bilaterally and quite diminished in the bases without any definitive rhonchi or crackles. Normal respiratory effort. No accessory muscle use. 2 L nasal cannula oxygen Cardiovascular: Regular rate and rhythm, no murmurs / rubs / gallops. No extremity edema. 2+ pedal pulses. No carotid bruits. Hands and feet cool to touch but with adequate capillary refill Abdomen: no tenderness, no masses palpated. No hepatosplenomegaly. Bowel sounds positive.  Musculoskeletal:  no clubbing / cyanosis. No joint deformity upper and lower extremities. Good ROM, no contractures. Normal muscle tone.  Skin: no rashes, lesions, ulcers. No induration Neurologic: CN 2-12 grossly intact-very hard of hearing. Sensation intact, DTR normal. Strength 5/5 x all 4 extremities.  Psychiatric:  Alert and oriented x name and place. Anxious mood.    Labs on Admission: I have personally reviewed following labs and imaging studies  CBC:  Recent Labs Lab 06/30/16 0437  WBC 14.4*  NEUTROABS 12.7*  HGB 12.0  HCT 37.1  MCV 89.2  PLT 301   Basic Metabolic Panel:  Recent Labs Lab 06/30/16 0437  NA 138  K 3.8  CL 99*  CO2 26  GLUCOSE 129*  BUN 30*  CREATININE 1.08*  CALCIUM 9.1   GFR: CrCl cannot be calculated (Unknown ideal weight.). Liver Function Tests:  Recent Labs Lab 06/30/16 0437  AST 24  ALT 13*  ALKPHOS 75  BILITOT 0.4  PROT 5.8*  ALBUMIN 3.4*   No results for input(s): LIPASE,  AMYLASE in the last 168 hours. No results for input(s): AMMONIA in the last 168 hours. Coagulation Profile:  Recent Labs Lab 06/30/16 0437 06/30/16 0830  INR 1.07 1.13   Cardiac Enzymes: No results for input(s): CKTOTAL, CKMB, CKMBINDEX, TROPONINI in the last 168 hours. BNP (last 3 results) No results for input(s): PROBNP in the last 8760 hours. HbA1C: No results for input(s): HGBA1C in the last 72 hours. CBG: No results for input(s): GLUCAP in the last 168 hours. Lipid Profile: No results for input(s): CHOL, HDL, LDLCALC, TRIG, CHOLHDL, LDLDIRECT in the last 72 hours. Thyroid Function Tests: No results for input(s): TSH, T4TOTAL, FREET4, T3FREE, THYROIDAB in the last 72 hours. Anemia Panel: No results for input(s): VITAMINB12, FOLATE, FERRITIN, TIBC, IRON, RETICCTPCT in the last 72 hours. Urine analysis:    Component Value Date/Time   COLORURINE YELLOW 04/15/2015 1656   APPEARANCEUR CLOUDY (A) 04/15/2015 1656   LABSPEC 1.013 04/15/2015 1656   PHURINE 6.0 04/15/2015 1656   GLUCOSEU NEGATIVE 04/15/2015 1656   HGBUR SMALL (A) 04/15/2015 1656   BILIRUBINUR NEGATIVE 04/15/2015 1656   KETONESUR NEGATIVE 04/15/2015 1656   PROTEINUR NEGATIVE 04/15/2015 1656   UROBILINOGEN 1.0 10/13/2012 0458   NITRITE NEGATIVE 04/15/2015 1656   LEUKOCYTESUR TRACE (A) 04/15/2015 1656   Sepsis Labs: _0 (procalcitonin:4,lacticidven:4) )No results found for this or any previous visit (from the past 240 hour(s)).   Radiological Exams on Admission: Dg Chest 2 View  Result Date: 06/30/2016 CLINICAL DATA:  83 y/o  F; cough and shortness of breath. EXAM: CHEST  2 VIEW COMPARISON:  04/15/2015 chest radiograph FINDINGS: Stable mild cardiomegaly. Aortic atherosclerosis with calcification. Mitral and tricuspid annuloplasty. Post sternotomy. Two lead pacemaker. Bones are diffusely demineralized. Right greater than left lung base consolidations likely represent pneumonia. IMPRESSION: Right greater than  left lung base consolidations likely represent pneumonia. Possible small effusions. These results were called by telephone at the time of interpretation on 06/30/2016 at 4:33 am to Dr. Pryor Curia , who verbally acknowledged these results. Electronically Signed   By: Kristine Garbe M.D.   On: 06/30/2016 04:34    EKG: (Independently reviewed) ventricular paced rhythm with underlying bundle branch block, QTC 471 ms, paced rate 85 bpm  Assessment/Plan Principal Problem:   Acute respiratory failure with hypoxia  -Patient presents with cough and generalized weakness and was found to be hypoxemic likely related to pneumonia process -Treat underlying causes -Continue supportive care with oxygen -Flutter valve  Active Problems:  Sepsis 2/2 HCAP (healthcare-associated pneumonia) -Patient presents with hypoxemia and cough with definitive focal infiltrates on chest x-ray -Sepsis physiology met as follows: Temperature greater than 601.6, systolic blood pressure less than 100, white count greater than 12,000, evidence of acute organ failure (lactic acid 2.18, acute kidney injury, possible altered mental status) -Volume resuscitation initiated in the ER with bolus 2 L-and 10 units IV resuscitation with normal saline at 100 mL per hour -Continue to cycle lactic acid since remains greater than 2 -Continue empiric broad-spectrum Zosyn/vancomycin IV -Follow up on blood cultures -Obtain sputum culture -Procalcitonin -Coags are normal -Influenza A/B neg    Acute kidney injury  -Baseline: 29/0.89 -Current: 30/1.08 -Of note, in 2014 patient BUN was 18 and creatinine 0.8 and this was before she was started on Lasix (see discussion below)    Atrial fibrillation -Currently rate control/pacemaker -Pharmacy warfarin management -CHADVASc=4    ?? CHF history -Last echocardiogram in 2011 as follows: Normal LVEF, prior mitral valve repair with plicated posterior leaflet and mild residual MR -Patient  on Lasix daily and given advanced age and noted protein calorie malnutrition and no evidence of persistent lower extremity edema may need to consider discontinuation of Lasix versus give on weight-based as needed protocol    Normocytic anemia -Last hemoglobin in 2016 was between 8.6 and 9.0 -Current hemoglobin 12 and likely reflective of hemoconcentration in setting of dehydration    Hypothyroidism -Continue Synthroid    History of Temporal arteritis -Not on chronic steroids     Protein-calorie malnutrition, severe  -Nutrition consultation      DVT prophylaxis: Lovenox Code Status: Full Family Communication: Attempted to call by POA Raynelle Highland to update on patient's status and to clarify CODE STATUS [phone]) but did not receive an answer-due to HIPPA standards a voice mail was not left Disposition Plan: Return to SNF Consults called: None    Kentrell Guettler L. ANP-BC [facility] Pager [phone]   If 7PM-7AM, please contact night-coverage [email or web address] Password [email or web address]  06/30/2016, 9:01 AM

## 2016-07-01 ENCOUNTER — Inpatient Hospital Stay (HOSPITAL_COMMUNITY): Payer: Medicare Other

## 2016-07-01 DIAGNOSIS — M7989 Other specified soft tissue disorders: Secondary | ICD-10-CM

## 2016-07-01 DIAGNOSIS — J9601 Acute respiratory failure with hypoxia: Secondary | ICD-10-CM

## 2016-07-01 LAB — CBC
HEMATOCRIT: 32 % — AB (ref 36.0–46.0)
HEMOGLOBIN: 10.4 g/dL — AB (ref 12.0–15.0)
MCH: 28.7 pg (ref 26.0–34.0)
MCHC: 32.5 g/dL (ref 30.0–36.0)
MCV: 88.2 fL (ref 78.0–100.0)
Platelets: 143 10*3/uL — ABNORMAL LOW (ref 150–400)
RBC: 3.63 MIL/uL — ABNORMAL LOW (ref 3.87–5.11)
RDW: 14.2 % (ref 11.5–15.5)
WBC: 10.6 10*3/uL — AB (ref 4.0–10.5)

## 2016-07-01 LAB — BASIC METABOLIC PANEL
ANION GAP: 10 (ref 5–15)
BUN: 19 mg/dL (ref 6–20)
CHLORIDE: 104 mmol/L (ref 101–111)
CO2: 22 mmol/L (ref 22–32)
Calcium: 8 mg/dL — ABNORMAL LOW (ref 8.9–10.3)
Creatinine, Ser: 0.88 mg/dL (ref 0.44–1.00)
GFR calc Af Amer: >60 mL/min (ref >60)
GFR, EST NON AFRICAN AMERICAN: 55 mL/min — AB (ref >60)
GLUCOSE: 105 mg/dL — AB (ref 65–99)
POTASSIUM: 3.5 mmol/L (ref 3.5–5.1)
SODIUM: 136 mmol/L (ref 135–145)

## 2016-07-01 LAB — URINE CULTURE

## 2016-07-01 LAB — LEGIONELLA PNEUMOPHILA SEROGP 1 UR AG: L. pneumophila Serogp 1 Ur Ag: NEGATIVE

## 2016-07-01 MED ORDER — ENSURE ENLIVE PO LIQD
237.0000 mL | Freq: Two times a day (BID) | ORAL | Status: DC
  Administered 2016-07-01 – 2016-07-03 (×4): 237 mL via ORAL

## 2016-07-01 MED ORDER — PRO-STAT SUGAR FREE PO LIQD
30.0000 mL | Freq: Every day | ORAL | Status: DC
  Administered 2016-07-02 – 2016-07-04 (×3): 30 mL via ORAL
  Filled 2016-07-01 (×3): qty 30

## 2016-07-01 MED ORDER — PRO-STAT SUGAR FREE PO LIQD: 30.0000 mL | Freq: Three times a day (TID) | ORAL | Status: DC

## 2016-07-01 MED ORDER — ADULT MULTIVITAMIN W/MINERALS CH
1.0000 | ORAL_TABLET | Freq: Every day | ORAL | Status: DC
  Administered 2016-07-02 – 2016-07-04 (×3): 1 via ORAL
  Filled 2016-07-01 (×3): qty 1

## 2016-07-01 MED ORDER — ENOXAPARIN SODIUM 30 MG/0.3ML ~~LOC~~ SOLN
30.0000 mg | SUBCUTANEOUS | Status: DC
  Administered 2016-07-01 – 2016-07-03 (×3): 30 mg via SUBCUTANEOUS
  Filled 2016-07-01 (×3): qty 0.3

## 2016-07-01 MED ORDER — SODIUM CHLORIDE 0.9 % IV SOLN
INTRAVENOUS | Status: AC
  Administered 2016-07-01: 12:00:00 via INTRAVENOUS

## 2016-07-01 NOTE — Progress Notes (Signed)
Nurse received a call from Patient's niece and HCPOA about talking to the doctor to determine prognosis for patient. Note placed on physician' sticky note to contact niece.

## 2016-07-01 NOTE — Progress Notes (Signed)
mepelex dsgs noted on left foot ,rt lateral knee and sacral area

## 2016-07-01 NOTE — Progress Notes (Signed)
PROGRESS NOTE                                                                                                                                                                                                             Patient Demographics:    Janice [name], is a 83 y.o. female, DOB - 04/18/1922, XLK:[id number]  Admit date - 06/30/2016   Admitting Physician Ozella Rocks, MD  Outpatient Primary MD for the patient is Thayer Headings, MD  LOS - 1  Chief Complaint  Patient presents with  . Cough  . Generalized Body Aches       Brief Narrative   Janice [name] is a 83 y.o. female with medical history significant for hypothyroidism, chronic atrial fibrillation Was on warfarin and was recently taken off, history of temporal arteritis, severe protein calorie malnutrition, pacemaker, normocytic anemia and GERD. She is a assisted-living resident who was admitted for pneumonia.   Subjective:    Janice [name] today has, No headache, No chest pain, No abdominal pain - No Nausea, No new weakness tingling or numbness, minimal Cough , No SOB.    Assessment  & Plan :    Principal Problem:   Acute respiratory failure with hypoxia (HCC) Active Problems:   Atrial fibrillation (HCC)   Normocytic anemia   Hypothyroidism   Temporal arteritis (HCC)   Protein-calorie malnutrition, severe (HCC)   HCAP (healthcare-associated pneumonia)   Acute kidney injury (HCC)   Abnormal urinalysis   Acute delirium   Sepsis due to pneumonia (HCC)   1. Sepsis with Acute hypoxic respiratory failure due to pneumonia. Clinically better, continue empiric antibiotics and follow cultures, sepsis physiology has resolved, continue flutter valve along with IV fluids and supportive care. Ruled out influenza.  2. ARF. Due to #1 above resolved with hydration.  3. Chronic atrial fibrillation. Possible sick sinus syndrome. Has pacemaker. [place] vasc 2 score of 4.  Not on any rate control medication, recently was taken off of Coumadin, monitor.  4. Lower extremity swelling. Minimal. Apply TED stockings, Obtain ultrasound.  5. Hypothyroidism. On Synthroid continue.  6. History of temporal arteritis. Stable not on chronic steroids.  7. GERD. On PPI  8. Generalized weakness, extremely frail status, moderate to severe protein calorie malnutrition. All due to advanced age, supportive care, protein supplements.    Diet : Diet regular  Room service appropriate? Yes; Fluid consistency: Thin    Family Communication  :  None  Code Status :  Full  Disposition Plan  :  SNF in 1-2 days  Consults  :  Noone  Procedures  :    Venous US Legs -   DVT Prophylaxis  :  Lovenox   Lab Results  Component Value Date   PLT 143 (L) 07/01/2016    Inpatient Medications  Scheduled Meds: . cycloSPORINE  1 drop Both Eyes BID  . enoxaparin (LOVENOX) injection  30 mg Subcutaneous Q24H  . gatifloxacin  1 drop Left Eye q morning - 10a  . levothyroxine  50 mcg Oral Daily  . pantoprazole  40 mg Oral Daily  . piperacillin-tazobactam (ZOSYN)  IV  3.375 g Intravenous Q8H  . simvastatin  5 mg Oral QHS  . sodium chloride flush  3 mL Intravenous Q12H  . vancomycin  500 mg Intravenous Q24H   Continuous Infusions: . sodium chloride     PRN Meds:.[DISCONTINUED] ondansetron **OR** ondansetron (ZOFRAN) IV  Antibiotics  :    Anti-infectives    Start     Dose/Rate Route Frequency Ordered Stop   07/01/16 0400  vancomycin (VANCOCIN) 500 mg in sodium chloride 0.9 % 100 mL IVPB     500 mg 100 mL/hr over 60 Minutes Intravenous Every 24 hours 06/30/16 0636     06/30/16 1000  piperacillin-tazobactam (ZOSYN) IVPB 3.375 g     3.375 g 12.5 mL/hr over 240 Minutes Intravenous Every 8 hours 06/30/16 0636     06/30/16 0430  piperacillin-tazobactam (ZOSYN) IVPB 3.375 g     3.375 g 100 mL/hr over 30 Minutes Intravenous  Once 06/30/16 0423 06/30/16 0641   06/30/16 0430   vancomycin (VANCOCIN) IVPB 1000 mg/200 mL premix     1,000 mg 200 mL/hr over 60 Minutes Intravenous  Once 06/30/16 0423 06/30/16 0615         Objective:   Vitals:   06/30/16 2201 07/01/16 0131 07/01/16 0640 07/01/16 0813  BP: 111/63 114/60 107/63 (!) 107/37  Pulse: (!) 105 70 70 70  Resp: 16 18 18 18   Temp: 98.5 F (36.9 C) 98 F (36.7 C) 98.2 F (36.8 C) 97.7 F (36.5 C)  TempSrc: Oral Oral Oral Oral  SpO2: 98% 98% 97% 96%  Weight:   49.9 kg (109 lb 14.4 oz)   Height:        Wt Readings from Last 3 Encounters:  07/01/16 49.9 kg (109 lb 14.4 oz)  04/15/15 45.4 kg (100 lb)  01/17/13 45.4 kg (100 lb)     Intake/Output Summary (Last 24 hours) at 07/01/16 1055 Last data filed at 07/01/16 0920  Gross per 24 hour  Intake               90 ml  Output                0 ml  Net               90 ml     Physical Exam  Awake Alert, Oriented X 2, No new F.N deficits, Normal affect [place].AT,PERRAL Supple Neck,No JVD, No cervical lymphadenopathy appriciated.  Symmetrical Chest wall movement, Good air movement bilaterally, few rales RRR,No Gallops,Rubs or new Murmurs, No Parasternal Heave +ve B.Sounds, Abd Soft, No tenderness, No organomegaly appriciated, No rebound - guarding or rigidity. No Cyanosis, Clubbing or edema, No new Rash or bruise       Data Review:  CBC  Recent Labs Lab 06/30/16 0437 07/01/16 0600  WBC 14.4* 10.6*  HGB 12.0 10.4*  HCT 37.1 32.0*  PLT 176 143*  MCV 89.2 88.2  MCH 28.8 28.7  MCHC 32.3 32.5  RDW 14.0 14.2  LYMPHSABS 1.0  --   MONOABS 0.7  --   EOSABS 0.0  --   BASOSABS 0.0  --     Chemistries   Recent Labs Lab 06/30/16 0437 07/01/16 0600  NA 138 136  K 3.8 3.5  CL 99* 104  CO2 26 22  GLUCOSE 129* 105*  BUN 30* 19  CREATININE 1.08* 0.88  CALCIUM 9.1 8.0*  AST 24  --   ALT 13*  --   ALKPHOS 75  --   BILITOT 0.4  --     ------------------------------------------------------------------------------------------------------------------ No results for input(s): CHOL, HDL, LDLCALC, TRIG, CHOLHDL, LDLDIRECT in the last 72 hours.  Lab Results  Component Value Date   HGBA1C 5.6 12/28/2011   ------------------------------------------------------------------------------------------------------------------ No results for input(s): TSH, T4TOTAL, T3FREE, THYROIDAB in the last 72 hours.  Invalid input(s): FREET3 ------------------------------------------------------------------------------------------------------------------ No results for input(s): VITAMINB12, FOLATE, FERRITIN, TIBC, IRON, RETICCTPCT in the last 72 hours.  Coagulation profile  Recent Labs Lab 06/30/16 0437 06/30/16 0830  INR 1.07 1.13    No results for input(s): DDIMER in the last 72 hours.  Cardiac Enzymes No results for input(s): CKMB, TROPONINI, MYOGLOBIN in the last 168 hours.  Invalid input(s): CK ------------------------------------------------------------------------------------------------------------------ No results found for: BNP  Micro Results No results found for this or any previous visit (from the past 240 hour(s)).  Radiology Reports Dg Chest 2 View  Result Date: 07/01/2016 CLINICAL DATA:  Cough X1 month; hx a-fib, HTN, non-smoker EXAM: CHEST  2 VIEW COMPARISON:  06/30/2016 FINDINGS: Right-sided transvenous pacemaker leads to the right atrium and right ventricle. Status post median sternotomy and valve replacement. The heart is enlarged and stable in configuration. Patchy infiltrates are identified at the lung bases bilaterally, stable since prior study. Infiltrate also suspected within the right middle lobe and lingula. There are bilateral pleural effusions. There wedge compression deformities of the thoracic and lumbar spine. IMPRESSION: Persistent bilateral infiltrates. Electronically Signed   By: Norva Pavlov M.D.    On: 07/01/2016 10:34   Dg Chest 2 View  Result Date: 06/30/2016 CLINICAL DATA:  83 y/o  F; cough and shortness of breath. EXAM: CHEST  2 VIEW COMPARISON:  04/15/2015 chest radiograph FINDINGS: Stable mild cardiomegaly. Aortic atherosclerosis with calcification. Mitral and tricuspid annuloplasty. Post sternotomy. Two lead pacemaker. Bones are diffusely demineralized. Right greater than left lung base consolidations likely represent pneumonia. IMPRESSION: Right greater than left lung base consolidations likely represent pneumonia. Possible small effusions. These results were called by telephone at the time of interpretation on 06/30/2016 at 4:33 am to Dr. Rochele Raring , who verbally acknowledged these results. Electronically Signed   By: Mitzi Hansen M.D.   On: 06/30/2016 04:34    Time Spent in minutes  30   SINGH,PRASHANT K M.D on 07/01/2016 at 10:55 AM  Between 7am to 7pm - Pager - [phone]  After 7pm go to [email or web address] - password [facility]  [facility] -  Office  [phone]

## 2016-07-01 NOTE — Evaluation (Signed)
Clinical/Bedside Swallow Evaluation Patient Details  Name: Janice [name] MRN: [id number] Date of Birth: 12-06-1921  Today's Date: 07/01/2016 Time: SLP Start Time (ACUTE ONLY): 1536 SLP Stop Time (ACUTE ONLY): 1550 SLP Time Calculation (min) (ACUTE ONLY): 14 min  Past Medical History:  Past Medical History:  Diagnosis Date  . Angina   . Atrial fibrillation (HCC)   . GERD (gastroesophageal reflux disease)   . Hip fracture (HCC)   . Hyperlipemia   . Hypertension   . Hypokalemia   . Hyponatremia   . Hypothyroidism   . Pacemaker   . Renal disorder    chronic kidney disorder  . Shortness of breath   . Temporal arteritis (HCC)    Past Surgical History:  Past Surgical History:  Procedure Laterality Date  . ABDOMINAL HYSTERECTOMY    . CATARACT EXTRACTION    . CHOLECYSTECTOMY    . INSERT / REPLACE / REMOVE PACEMAKER    . [employer]    . MV repair     at [facility]  . ORIF ACETABULAR FRACTURE     Right  . PPM     HPI:  83 y.o.femalewith medical history significant for hypothyroidism, chronic atrial fibrillation Was on warfarinand was recently taken off, history of temporal arteritis, severe protein calorie malnutrition, pacemaker, normocytic anemia and GERD. She is an assisted-living resident who was admitted for pneumonia., sepsis.   Assessment / Plan / Recommendation Clinical Impression  Pt presents with normal oropharyngeal swallow function with adequate mastication, brisk swallow response, no s/s of aspiration.  Doubt dysphagia as etiology of pna.  No further SLP f/u needed.     Aspiration Risk  No limitations    Diet Recommendation   regular solids, thin liquids  Medication Administration: Whole meds with liquid    Other  Recommendations Oral Care Recommendations: Oral care BID   Follow up Recommendations None      Frequency and Duration            Prognosis        Swallow Study   General Date of Onset: 06/30/16 HPI: 83 y.o.femalewith medical  history significant for hypothyroidism, chronic atrial fibrillation Was on warfarinand was recently taken off, history of temporal arteritis, severe protein calorie malnutrition, pacemaker, normocytic anemia and GERD. She is an assisted-living resident who was admitted for pneumonia., sepsis. Type of Study: Bedside Swallow Evaluation Previous Swallow Assessment: no Diet Prior to this Study: Regular;Thin liquids Temperature Spikes Noted: No Respiratory Status: Nasal cannula History of Recent Intubation: No Behavior/Cognition: Alert;Cooperative;Pleasant mood Oral Cavity Assessment: Within Functional Limits Oral Care Completed by SLP: No Oral Cavity - Dentition: Adequate natural dentition Vision: Functional for self-feeding Self-Feeding Abilities: Able to feed self Patient Positioning: Upright in chair Baseline Vocal Quality: Normal Volitional Cough: Strong Volitional Swallow: Able to elicit    Oral/Motor/Sensory Function Overall Oral Motor/Sensory Function: Within functional limits   Ice Chips Ice chips: Within functional limits   Thin Liquid Thin Liquid: Within functional limits Presentation: Cup;Straw    Nectar Thick Nectar Thick Liquid: Not tested   Honey Thick Honey Thick Liquid: Not tested   Puree Puree: Within functional limits   Solid   GO   Solid: Within functional limits        Janice [name], Janice [name] 07/01/2016,3:54 PM

## 2016-07-01 NOTE — Progress Notes (Signed)
Initial Nutrition Assessment  DOCUMENTATION CODES:   Severe malnutrition in context of chronic illness, Underweight  INTERVENTION:  Provide Ensure Enlive po BID, each supplement provides 350 kcal and 20 grams of protein Change Pro-stat to once daily, provides 15 grams of protein and 100 kcal Provide Multivitamin with minerals daily   NUTRITION DIAGNOSIS:   Malnutrition related to chronic illness as evidenced by severe depletion of body fat, severe depletion of muscle mass.   GOAL:   Patient will meet greater than or equal to 90% of their needs   MONITOR:   PO intake, Supplement acceptance, Skin, Weight trends, Labs, I & O's  REASON FOR ASSESSMENT:   Consult Assessment of nutrition requirement/status  ASSESSMENT:   83 y.o. female with medical history significant for hypothyroidism, chronic atrial fibrillation Was on warfarin and was recently taken off, history of temporal arteritis, severe protein calorie malnutrition, pacemaker, normocytic anemia and GERD. She is a assisted-living resident who was admitted for pneumonia.  Pt states that her appetite has been good recently and she has been eating well. She reports that she usually weighs 103 lbs and has recently gained up to 111 lbs. She reports eating really well today- about 50% of breakfast and 75% of lunch. She reports that she drinks Ensure once daily some days at home.  Pt has severe muscle wasting and severe fat wasting per nutrition-focused physical exam. Pt is underweight based on BMI and she has multiple stage I pressure injuries and a stage II pressure injury on sacrum per nursing notes. RD recommended increasing Ensure to twice daily and pt is agreeable to this.   Labs: low calcium  Diet Order:  Diet regular Room service appropriate? Yes; Fluid consistency: Thin  Skin:  Wound (see comment) (Stage II PI on sacrum; Stage I on toe and heel)  Last BM:  2/8  Height:   Ht Readings from Last 1 Encounters:   06/30/16 5' 7.5" (1.715 m)    Weight:   Wt Readings from Last 1 Encounters:  07/01/16 109 lb 14.4 oz (49.9 kg)    Ideal Body Weight:  61.36 kg  BMI:  Body mass index is 16.96 kg/m.  Estimated Nutritional Needs:   Kcal:  1300-1500  Protein:  65-75 grams  Fluid:  1.3-1.5 L/day  EDUCATION NEEDS:   No education needs identified at this time  Dorothea Ogleeanne Zylpha Poynor RD, CSP, LDN Inpatient Clinical Dietitian Pager: [phone] After Hours Pager: [phone]

## 2016-07-01 NOTE — Evaluation (Signed)
Physical Therapy Evaluation Patient Details Name: Janice [name] MRN: [id number] DOB: Sep 23, 1921 Today's Date: 07/01/2016   History of Present Illness  83 y.o. female presenting with hypoxia and PNA. Pt has history of GERD, chronic afib, hypothyroidism, and pacemaker.  Clinical Impression  Pt presents with decreased strength, balance, and activity tolerance due to above. PTA pt was living in independent living with physical assist for RW use and I with WC mobility, per pt. Pt currently requires max-modA x 2 for all mobility for safety. Pt would benefit from d/c to SNF with 24 hour assist/supervision for safety when medically ready. Acute PT will continue to follow.     Follow Up Recommendations SNF;Supervision/Assistance - 24 hour    Equipment Recommendations  None recommended by PT    Recommendations for Other Services       Precautions / Restrictions Precautions Precautions: Fall Restrictions Weight Bearing Restrictions: No      Mobility  Bed Mobility               General bed mobility comments: pt in chair upon arrival  Transfers Overall transfer level: Needs assistance Equipment used: Rolling walker (2 wheeled) Transfers: Stand Pivot Transfers   Stand pivot transfers: Max assist       General transfer comment: sit to stand without RW max A x2, stand pivot from [facility] to chair with RW modA x 2, v/c for upright posture  Ambulation/Gait                Stairs            Wheelchair Mobility    Modified Rankin (Stroke Patients Only)       Balance Overall balance assessment: Needs assistance Sitting-balance support: Feet supported;No upper extremity supported Sitting balance-Leahy Scale: Poor Sitting balance - Comments: pt requires min A to maintain upright posture, unable to self correct with v/c for upright posture, L lateral trunk lean                                     Pertinent Vitals/Pain Pain Assessment: No/denies pain     Home Living Family/patient expects to be discharged to:: Independent Living                      Prior Function Level of Independence: Needs assistance   Gait / Transfers Assistance Needed: per patient, PTA she was receiving physical assist with use of walker, but was I with wheelchair use  ADL's / Homemaking Assistance Needed: unknown        Hand Dominance        Extremity/Trunk Assessment   Upper Extremity Assessment Upper Extremity Assessment: Generalized weakness    Lower Extremity Assessment Lower Extremity Assessment: Generalized weakness    Cervical / Trunk Assessment Cervical / Trunk Assessment: Kyphotic  Communication   Communication: HOH  Cognition Arousal/Alertness: Awake/alert Behavior During Therapy: Impulsive Overall Cognitive Status: No family/caregiver present to determine baseline cognitive functioning                 General Comments: pt difficult to arouse upon arrival, but once seated upright at edge of chair was alert    General Comments General comments (skin integrity, edema, etc.): <90 degrees knee flexion on R     Exercises     Assessment/Plan    PT Assessment Patient needs continued PT services  PT Problem List Decreased strength;Decreased range  of motion;Decreased activity tolerance;Decreased balance;Decreased mobility;Decreased cognition;Decreased safety awareness          PT Treatment Interventions Gait training;Therapeutic activities;Therapeutic exercise;Balance training;Patient/family education    PT Goals (Current goals can be found in the Care Plan section)  Acute Rehab PT Goals Patient Stated Goal: none stated    Frequency Min 3X/week   Barriers to discharge Decreased caregiver support pt requires maxA x 2 for safety and physical    Co-evaluation               End of Session Equipment Utilized During Treatment: Gait belt;Oxygen Activity Tolerance: No increased pain Patient left: in  chair;with call bell/phone within reach;with chair alarm set;with nursing/sitter in room Nurse Communication: Mobility status         Time: 1451-1520 PT Time Calculation (min) (ACUTE ONLY): 29 min   Charges:   PT Evaluation $PT Eval Moderate Complexity: 1 Procedure PT Treatments $Therapeutic Activity: 8-22 mins   PT G CodesLane [name]:        Janice [name] 07/01/2016, 4:29 PM  Janice [name], SPT Acute Rehab SPT [phone]

## 2016-07-01 NOTE — Progress Notes (Signed)
**  Preliminary report by tech**  Right lower extremity venous duplex complete. There is no evidence of deep or superficial vein thrombosis involving the right lower extremity. All visualized vessels appear patent and compressible. There is no evidence of a Baker's cyst on the right.  07/01/16 10:56 AM Olen CordialGreg Precilla Purnell RVT

## 2016-07-02 NOTE — Progress Notes (Signed)
Pharmacy Antibiotic Note  Janice [name] is a 83 y.o. female admitted on 06/30/2016 with sepsis/pneumonia.   Continues on vancomycin and Zosyn  Day # 3 of broad spectrum antibiotics Cultures negative to date WBC improved, afebrile   Plan: Continue Zosyn 3.375gm IV q8h - doses over 4 hours Continue Vancomycin 500mg  IV q24h - consider stopping? Continue to follow   Height: 5' 7.5" (171.5 cm) Weight: 112 lb 4.8 oz (50.9 kg) IBW/kg (Calculated) : 62.75  Temp (24hrs), Avg:98 F (36.7 C), Min:97.8 F (36.6 C), Max:98.1 F (36.7 C)   Recent Labs Lab 06/30/16 0437 06/30/16 0452 06/30/16 0718 06/30/16 1517 06/30/16 1823 07/01/16 0600  WBC 14.4*  --   --   --   --  10.6*  CREATININE 1.08*  --   --   --   --  0.88  LATICACIDVEN  --  2.06* 2.18* 2.7* 1.7  --     Estimated Creatinine Clearance: 32.8 mL/min (by C-G formula based on SCr of 0.88 mg/dL).    No Known Allergies  Antimicrobials this admission: 2/7 Vanc >>  2/7 Zosyn >>   Dose adjustments this admission: n/a  Microbiology results: 2/7 BCx x2: NGTD   Thank you Janice [name], PharmD [phone]  07/02/2016 10:07 AM

## 2016-07-02 NOTE — Clinical Social Work Note (Signed)
Patient will return to [facility] with HHPT.  CSW signing off. Consult again if any other social work needs arise.  Charlynn CourtSarah Brandyn Lowrey, CSW [phone]

## 2016-07-02 NOTE — Progress Notes (Addendum)
PROGRESS NOTE                                                                                                                                                                                                             Patient Demographics:    Janice [name], is a 83 y.o. female, DOB - 1922/03/01, ZOX:[id number]  Admit date - 06/30/2016   Admitting Physician Ozella Rocks, MD  Outpatient Primary MD for the patient is Thayer Headings, MD  LOS - 2  Chief Complaint  Patient presents with  . Cough  . Generalized Body Aches       Brief Narrative  -  Janice [name] is a 83 y.o. female with medical history significant for hypothyroidism, chronic atrial fibrillation Was on warfarin and was recently taken off, history of temporal arteritis, severe protein calorie malnutrition, pacemaker, normocytic anemia and GERD. She is a assisted-living resident who was admitted for pneumonia.   Subjective:    Janice [name] today has, No headache, No chest pain, No abdominal pain - No Nausea, No new weakness tingling or numbness, minimal Cough , No SOB.    Assessment  & Plan :     1. Sepsis with Acute hypoxic respiratory failure due to CAP. Clinically better, continue empiric antibiotics and follow cultures, sepsis physiology has resolved, continue flutter valve along with IV fluids and supportive care. Ruled out influenza. Cleared by Speech.  2. ARF. Due to #1 above resolved with hydration.  3. Chronic atrial fibrillation. Possible sick sinus syndrome. Has pacemaker. [place] vasc 2 score of 4. Not on any rate control medication, recently was taken off of Coumadin, monitor.  4. Lower extremity swelling. Minimal. Apply TED stockings, negative ultrasound.  5. Hypothyroidism. On Synthroid continue.  6. History of temporal arteritis. Stable not on chronic steroids.  7. GERD. On PPI  8. Generalized weakness, extremely frail status, moderate to  severe protein calorie malnutrition. All due to advanced age, supportive care, protein supplements. Will need SNF currently in ALF.  9. H/O L hip fracture with failed prosthesis - supportive care for several years.    Diet : Diet regular Room service appropriate? Yes; Fluid consistency: Thin    Family Communication  :  Niece Larita Fife 07-02-16  Code Status :  Full  Disposition Plan  :  SNF in 1-2 days  Consults  :  Noone  Procedures  :    Venous US Legs - No DVT-SVT  DVT Prophylaxis  :  Lovenox   Lab Results  Component Value Date   PLT 143 (L) 07/01/2016    Inpatient Medications  Scheduled Meds: . cycloSPORINE  1 drop Both Eyes BID  . enoxaparin (LOVENOX) injection  30 mg Subcutaneous Q24H  . feeding supplement (ENSURE ENLIVE)  237 mL Oral BID  . feeding supplement (PRO-STAT SUGAR FREE 64)  30 mL Oral Daily  . gatifloxacin  1 drop Left Eye q morning - 10a  . levothyroxine  50 mcg Oral Daily  . multivitamin with minerals  1 tablet Oral Daily  . pantoprazole  40 mg Oral Daily  . piperacillin-tazobactam (ZOSYN)  IV  3.375 g Intravenous Q8H  . simvastatin  5 mg Oral QHS  . sodium chloride flush  3 mL Intravenous Q12H  . vancomycin  500 mg Intravenous Q24H   Continuous Infusions: . sodium chloride 50 mL/hr at 07/01/16 1143   PRN Meds:.[DISCONTINUED] ondansetron **OR** ondansetron (ZOFRAN) IV  Antibiotics  :    Anti-infectives    Start     Dose/Rate Route Frequency Ordered Stop   07/01/16 0400  vancomycin (VANCOCIN) 500 mg in sodium chloride 0.9 % 100 mL IVPB     500 mg 100 mL/hr over 60 Minutes Intravenous Every 24 hours 06/30/16 0636     06/30/16 1000  piperacillin-tazobactam (ZOSYN) IVPB 3.375 g     3.375 g 12.5 mL/hr over 240 Minutes Intravenous Every 8 hours 06/30/16 0636     06/30/16 0430  piperacillin-tazobactam (ZOSYN) IVPB 3.375 g     3.375 g 100 mL/hr over 30 Minutes Intravenous  Once 06/30/16 0423 06/30/16 0641   06/30/16 0430  vancomycin (VANCOCIN) IVPB  1000 mg/200 mL premix     1,000 mg 200 mL/hr over 60 Minutes Intravenous  Once 06/30/16 0423 06/30/16 0615         Objective:   Vitals:   07/01/16 0813 07/01/16 1233 07/01/16 2154 07/02/16 0430  BP: (!) 107/37 (!) 122/53 (!) 109/46 131/72  Pulse: 70 85 69 70  Resp: 18 18 18 18   Temp: 97.7 F (36.5 C)  97.8 F (36.6 C) 98.1 F (36.7 C)  TempSrc: Oral  Oral Oral  SpO2: 96% 100% 97% 99%  Weight:    50.9 kg (112 lb 4.8 oz)  Height:        Wt Readings from Last 3 Encounters:  07/02/16 50.9 kg (112 lb 4.8 oz)  04/15/15 45.4 kg (100 lb)  01/17/13 45.4 kg (100 lb)     Intake/Output Summary (Last 24 hours) at 07/02/16 1011 Last data filed at 07/02/16 0858  Gross per 24 hour  Intake            37190 ml  Output              401 ml  Net            36789 ml     Physical Exam  Awake Alert, Oriented X 2, No new F.N deficits, Normal affect [place].AT,PERRAL Supple Neck,No JVD, No cervical lymphadenopathy appriciated.  Symmetrical Chest wall movement, Good air movement bilaterally, few rales RRR,No Gallops,Rubs or new Murmurs, No Parasternal Heave +ve B.Sounds, Abd Soft, No tenderness, No organomegaly appriciated, No rebound - guarding or rigidity. No Cyanosis, Clubbing or edema, No new Rash or bruise       Data Review:    CBC  Recent Labs Lab 06/30/16 [phone]  07/01/16 0600  WBC 14.4* 10.6*  HGB 12.0 10.4*  HCT 37.1 32.0*  PLT 176 143*  MCV 89.2 88.2  MCH 28.8 28.7  MCHC 32.3 32.5  RDW 14.0 14.2  LYMPHSABS 1.0  --   MONOABS 0.7  --   EOSABS 0.0  --   BASOSABS 0.0  --     Chemistries   Recent Labs Lab 06/30/16 0437 07/01/16 0600  NA 138 136  K 3.8 3.5  CL 99* 104  CO2 26 22  GLUCOSE 129* 105*  BUN 30* 19  CREATININE 1.08* 0.88  CALCIUM 9.1 8.0*  AST 24  --   ALT 13*  --   ALKPHOS 75  --   BILITOT 0.4  --    ------------------------------------------------------------------------------------------------------------------ No results for input(s):  CHOL, HDL, LDLCALC, TRIG, CHOLHDL, LDLDIRECT in the last 72 hours.  Lab Results  Component Value Date   HGBA1C 5.6 12/28/2011   ------------------------------------------------------------------------------------------------------------------ No results for input(s): TSH, T4TOTAL, T3FREE, THYROIDAB in the last 72 hours.  Invalid input(s): FREET3 ------------------------------------------------------------------------------------------------------------------ No results for input(s): VITAMINB12, FOLATE, FERRITIN, TIBC, IRON, RETICCTPCT in the last 72 hours.  Coagulation profile  Recent Labs Lab 06/30/16 0437 06/30/16 0830  INR 1.07 1.13    No results for input(s): DDIMER in the last 72 hours.  Cardiac Enzymes No results for input(s): CKMB, TROPONINI, MYOGLOBIN in the last 168 hours.  Invalid input(s): CK ------------------------------------------------------------------------------------------------------------------ No results found for: BNP  Micro Results Recent Results (from the past 240 hour(s))  Blood Culture (routine x 2)     Status: None (Preliminary result)   Collection Time: 06/30/16  4:30 AM  Result Value Ref Range Status   Specimen Description BLOOD RIGHT ARM  Final   Special Requests BOTTLES DRAWN AEROBIC AND ANAEROBIC 5ML  Final   Culture NO GROWTH 1 DAY  Final   Report Status PENDING  Incomplete  Blood Culture (routine x 2)     Status: None (Preliminary result)   Collection Time: 06/30/16  4:37 AM  Result Value Ref Range Status   Specimen Description BLOOD LEFT ARM  Final   Special Requests IN PEDIATRIC BOTTLE 4ML  Final   Culture NO GROWTH 1 DAY  Final   Report Status PENDING  Incomplete  Urine culture     Status: Abnormal   Collection Time: 06/30/16  9:35 AM  Result Value Ref Range Status   Specimen Description URINE, RANDOM  Final   Special Requests NONE  Final   Culture MULTIPLE SPECIES PRESENT, SUGGEST RECOLLECTION (A)  Final   Report Status  07/01/2016 FINAL  Final    Radiology Reports Dg Chest 2 View  Result Date: 07/01/2016 CLINICAL DATA:  Cough X1 month; hx a-fib, HTN, non-smoker EXAM: CHEST  2 VIEW COMPARISON:  06/30/2016 FINDINGS: Right-sided transvenous pacemaker leads to the right atrium and right ventricle. Status post median sternotomy and valve replacement. The heart is enlarged and stable in configuration. Patchy infiltrates are identified at the lung bases bilaterally, stable since prior study. Infiltrate also suspected within the right middle lobe and lingula. There are bilateral pleural effusions. There wedge compression deformities of the thoracic and lumbar spine. IMPRESSION: Persistent bilateral infiltrates. Electronically Signed   By: Norva PavlovElizabeth  Brown M.D.   On: 07/01/2016 10:34   Dg Chest 2 View  Result Date: 06/30/2016 CLINICAL DATA:  83 y/o  F; cough and shortness of breath. EXAM: CHEST  2 VIEW COMPARISON:  04/15/2015 chest radiograph FINDINGS: Stable mild cardiomegaly. Aortic atherosclerosis with calcification. Mitral and tricuspid  annuloplasty. Post sternotomy. Two lead pacemaker. Bones are diffusely demineralized. Right greater than left lung base consolidations likely represent pneumonia. IMPRESSION: Right greater than left lung base consolidations likely represent pneumonia. Possible small effusions. These results were called by telephone at the time of interpretation on 06/30/2016 at 4:33 am to Dr. Rochele Raring , who verbally acknowledged these results. Electronically Signed   By: Mitzi Hansen M.D.   On: 06/30/2016 04:34    Time Spent in minutes  30   SINGH,PRASHANT K M.D on 07/02/2016 at 10:11 AM  Between 7am to 7pm - Pager - [phone]  After 7pm go to [email or web address] - password [facility]  [facility] -  Office  [phone]

## 2016-07-02 NOTE — Progress Notes (Signed)
Spoke to Wanita ChamberlainJudy Jackson, Facilities managerurse Manager at [facility] regarding pt. Explained the mobility and pressure injuries pt has here that were present on admission. Darel HongJudy stated that this is the baseline pt has been out. She asked for MD to place order for Home PT/OT eval and treat and wound eval and treat. Spoke with Steward DroneBrenda, case manager who helped with the process.

## 2016-07-02 NOTE — Care Management Note (Signed)
Case Management Note  Patient Details  Name: Janice [name] MRN: [id number] Date of Birth: 1921-11-14  Subjective/Objective:   Admitted with Acute Resp Failure                 Action/Plan: Patient resides at [facility]; CM talked to her niece Gaetano NetLynn POA and the patient; plan is to return to the ILF at discharge with [facility] Corrie DandyMary with [facility] called for arrangements. CM will continue to follow for DCP  Expected Discharge Date:     Possibly 07/03/2016             Expected Discharge Plan:  Home w Home Health Services  Discharge planning Services  CM Consult   Choice offered to:  [facility] / Guardian  HH Arranged:  RN, PT, OT, Nurse's Aide, Social Work [employer] [facility]  Status of Service:  In process, will continue to follow  Reola MosherChandler, Minah Axelrod L, RN,MHA,BSN [phone] 07/02/2016, 3:21 PM

## 2016-07-02 NOTE — Clinical Social Work Note (Signed)
Clinical Social Work Assessment  Patient Details  Name: Janice [name] MRN: [id number] Date of Birth: [birth year]  Date of referral:  07/02/16               Reason for consult:  Discharge Planning                Permission sought to share information with:  Facility Sport and exercise psychologist, Family Supports Permission granted to share information::  Yes, Verbal Permission Granted  Name::     Janice [name]  Agency::  [facility] [employer]  Relationship::  Niece/HCPOA  Contact Information:  [phone]  Housing/Transportation Living arrangements for the past 2 months:  [place] of Information:  Patient, Medical Team, Power of Attorney Patient Interpreter Needed:  None Criminal Activity/Legal Involvement Pertinent to Current Situation/Hospitalization:  No - Comment as needed Significant Relationships:  Other Family Members (Niece and great-nephew) Lives with:  Facility Resident, Self Do you feel safe going back to the place where you live?  Yes Need for family participation in patient care:  Yes (Comment)  Care giving concerns:  Patient is from [place] at [facility]. PT recommending SNF once medically stable for discharge.   Social Worker assessment / plan:  CSW met with patient. No supports at bedside. CSW introduced role and explained that PT recommendations would be discussed. Patient hard of hearing. Patient is refusing SNF, stating that she gets enough physical therapy at her ILF and wants to continue that. She gave permission for CSW to call her niece/HCPOA, Janice [name]. CSW discussed this with patient's niece. She is agreeable to patient returning to [facility], stating that if patient gets moved to an unfamiliar place she will likely decline faster. Patient's niece states that she would like more time to find a suitable nursing home for the patient rather than sending her wherever there is a bed available. CSW expressed  understanding. CSW called and spoke with the nurse manager at [facility]. She asked that the RN call her to give her more information on the patient's medical status in order for them to determine if patient can return or not. They do provide some services but are limited because patient is in independent living. Phone number for nurse manager provided to patient's RN. No further concerns. CSW encouraged patient and her niece to contact CSW as needed. CSW will continue to follow and facilitate discharge to SNF, if needed, once medically stable for discharge.  Employment status:  Retired Nurse, adult PT Recommendations:  [place] / Referral to community resources:  [place]  Patient/Family's Response to care:  Patient and her niece would prefer to return to [place] with HHPT. Patient's family supportive and involved in patient's care. Patient and her niece appreciated social work intervention.  Patient/Family's Understanding of and Emotional Response to Diagnosis, Current Treatment, and Prognosis:  Patient and her niece appear to have a good understanding of the reason for admission. Patient and her niece appear happy with hospital care.  Emotional Assessment Appearance:  Appears stated age Attitude/Demeanor/Rapport:  Other (Pleasant) Affect (typically observed):  Appropriate, Calm, Pleasant Orientation:  Oriented to Self, Oriented to Place, Oriented to  Time, Oriented to Situation Alcohol / Substance use:  Never Used Psych involvement (Current and /or in the community):  No (Comment)  Discharge Needs  Concerns to be addressed:  Care Coordination Readmission within the last 30 days:  No Current discharge risk:  Dependent with Mobility,  Lives alone Barriers to Discharge:  Continued Medical Work up   Candie Chroman, LCSW 07/02/2016, 1:41 PM

## 2016-07-03 MED ORDER — POTASSIUM CHLORIDE 20 MEQ/15ML (10%) PO SOLN
40.0000 meq | Freq: Once | ORAL | Status: AC
  Administered 2016-07-03: 40 meq via ORAL
  Filled 2016-07-03: qty 30

## 2016-07-03 NOTE — Progress Notes (Signed)
SATURATION QUALIFICATIONS: (This note is used to comply with regulatory documentation for home oxygen)  Patient Saturations on Room Air at Rest = 95%  Patient Saturations on Room Air while Ambulating = pt wheelchair bound  Patient Saturations on 2 Liters of oxygen= 99 %

## 2016-07-03 NOTE — Progress Notes (Signed)
Patient rested well overnight. Vitals remained stable. Patient was less impulsive and more oriented overnight.  Patient in bed resting.

## 2016-07-03 NOTE — Progress Notes (Addendum)
Case management request oxygen saturations for qualification for home oxygen. See previous note. Paged MD to inform of pt room air saturation. Pt unable to ambulate. Daughter and grandson in room, state she is wheelchair bound. Completed admission database at bedside with family members. Family is refusing SNF. Social work came to bedside to educate and explain possible discharge to nursing facility, family still refused, will inform MD.  Buzzy Hanristin Annalie Wenner

## 2016-07-03 NOTE — Progress Notes (Signed)
PROGRESS NOTE                                                                                                                                                                                                             Patient Demographics:    Janice [name], is a 83 y.o. female, DOB - 09/19/1921, ZOX:[id number]  Admit date - 06/30/2016   Admitting Physician Ozella Rocksavid J Merrell, MD  Outpatient Primary MD for the patient is Thayer HeadingsMACKENZIE,BRIAN, MD  LOS - 3  Chief Complaint  Patient presents with  . Cough  . Generalized Body Aches       Brief Narrative  -  Janice [name] is a 83 y.o. female with medical history significant for hypothyroidism, chronic atrial fibrillation Was on warfarin and was recently taken off, history of temporal arteritis, severe protein calorie malnutrition, pacemaker, normocytic anemia and GERD. She is a assisted-living resident who was admitted for pneumonia.   Subjective:    Janice [name] today has, No headache, No chest pain, No abdominal pain - No Nausea, No new weakness tingling or numbness, minimal Cough , No SOB.    Assessment  & Plan :     1. Sepsis with Acute hypoxic respiratory failure due to CAP. Clinically better, continue empiric antibiotics and follow cultures,  sepsis physiology has resolved, continue flutter valve along with IV fluids and supportive care. Ruled out influenza. Cleared by Speech. At baseline she is extremely frail and weak and in long-term at risk for aspiration.  2. ARF. Due to #1 above resolved with hydration.  3. Chronic atrial fibrillation. Possible sick sinus syndrome. Has pacemaker. [place] vasc 2 score of 4. Not on any rate control medication, recently was taken off of Coumadin, monitor.  4. Lower  extremity swelling. Minimal. Apply TED stockings, negative ultrasound.  5. Hypothyroidism. On Synthroid continue.  6. History of temporal arteritis. Stable not on chronic steroids.  7. GERD. On PPI  8. Generalized weakness, extremely frail status, moderate to severe protein calorie malnutrition. All due to advanced age, supportive care, protein supplements. Will need SNF currently in ALF.  9. H/O L hip fracture with failed prosthesis - supportive care for several years.    Diet : Diet regular Room service appropriate? Yes; Fluid consistency: Thin    Family Communication  :  Niece Larita Fife 07-02-16 and she is the POA, she wants patient to be placed to a SNF which I concur with.  Code Status :  Full  Disposition Plan  :  SNF in 1-2 days per request of niece POA, the recommendation by PT, and in my opinion she will be best served in SNF as well. Social work was consulted on 07/02/2016 to arrange for the same.  Consults  :  None  Procedures  :    Venous US Legs - No DVT-SVT  DVT Prophylaxis  :  Lovenox   Lab Results  Component Value Date   PLT 143 (L) 07/01/2016    Inpatient Medications  Scheduled Meds: . cycloSPORINE  1 drop Both Eyes BID  . enoxaparin (LOVENOX) injection  30 mg Subcutaneous Q24H  . feeding supplement (ENSURE ENLIVE)  237 mL Oral BID  . feeding supplement (PRO-STAT SUGAR FREE 64)  30 mL Oral Daily  . gatifloxacin  1 drop Left Eye q morning - 10a  . levothyroxine  50 mcg Oral Daily  . multivitamin with minerals  1 tablet Oral Daily  . pantoprazole  40 mg Oral Daily  . piperacillin-tazobactam (ZOSYN)  IV  3.375 g Intravenous Q8H  . simvastatin  5 mg Oral QHS  . sodium chloride flush  3 mL Intravenous Q12H  . vancomycin  500 mg Intravenous Q24H   Continuous Infusions:  PRN Meds:.[DISCONTINUED] ondansetron **OR** ondansetron (ZOFRAN) IV  Antibiotics  :    Anti-infectives    Start     Dose/Rate Route Frequency Ordered Stop   07/01/16 0400  vancomycin  (VANCOCIN) 500 mg in sodium chloride 0.9 % 100 mL IVPB     500 mg 100 mL/hr over 60 Minutes Intravenous Every 24 hours 06/30/16 0636     06/30/16 1000  piperacillin-tazobactam (ZOSYN) IVPB 3.375 g     3.375 g 12.5 mL/hr over 240 Minutes Intravenous Every 8 hours 06/30/16 0636     06/30/16 0430  piperacillin-tazobactam (ZOSYN) IVPB 3.375 g     3.375 g 100 mL/hr over 30 Minutes Intravenous  Once 06/30/16 0423 06/30/16 0641   06/30/16 0430  vancomycin (VANCOCIN) IVPB 1000 mg/200 mL premix     1,000 mg 200 mL/hr over 60 Minutes Intravenous  Once 06/30/16 0423 06/30/16 0615         Objective:   Vitals:   07/02/16 1202 07/02/16 1900 07/03/16 0032 07/03/16 0443  BP: 140/74 136/85 134/69 (!) 147/76  Pulse: 70 85 84  85  Resp: 18 16 18 16   Temp: 97.9 F (36.6 C) 98.3 F (36.8 C) 98 F (36.7 C) 97.8 F (36.6 C)  TempSrc: Oral Oral Oral Oral  SpO2: 100% 98% 97% 96%  Weight:    52.4 kg (115 lb 8 oz)  Height:        Wt Readings from Last 3 Encounters:  07/03/16 52.4 kg (115 lb 8 oz)  04/15/15 45.4 kg (100 lb)  01/17/13 45.4 kg (100 lb)     Intake/Output Summary (Last 24 hours) at 07/03/16 1059 Last data filed at 07/03/16 0900  Gross per 24 hour  Intake              960 ml  Output                1 ml  Net              959 ml     Physical Exam  Awake Alert, Oriented X 2, No new F.N deficits, Normal affect [place].AT,PERRAL Supple Neck,No JVD, No cervical lymphadenopathy appriciated.  Symmetrical Chest wall movement, Good air movement bilaterally, few rales RRR,No Gallops,Rubs or new Murmurs, No Parasternal Heave +ve B.Sounds, Abd Soft, No tenderness, No organomegaly appriciated, No rebound - guarding or rigidity. No Cyanosis, Clubbing or edema, No new Rash or bruise       Data Review:    CBC  Recent Labs Lab 06/30/16 0437 07/01/16 0600  WBC 14.4* 10.6*  HGB 12.0 10.4*  HCT 37.1 32.0*  PLT 176 143*  MCV 89.2 88.2  MCH 28.8 28.7  MCHC 32.3 32.5  RDW 14.0 14.2    LYMPHSABS 1.0  --   MONOABS 0.7  --   EOSABS 0.0  --   BASOSABS 0.0  --     Chemistries   Recent Labs Lab 06/30/16 0437 07/01/16 0600  NA 138 136  K 3.8 3.5  CL 99* 104  CO2 26 22  GLUCOSE 129* 105*  BUN 30* 19  CREATININE 1.08* 0.88  CALCIUM 9.1 8.0*  AST 24  --   ALT 13*  --   ALKPHOS 75  --   BILITOT 0.4  --    ------------------------------------------------------------------------------------------------------------------ No results for input(s): CHOL, HDL, LDLCALC, TRIG, CHOLHDL, LDLDIRECT in the last 72 hours.  Lab Results  Component Value Date   HGBA1C 5.6 12/28/2011   ------------------------------------------------------------------------------------------------------------------ No results for input(s): TSH, T4TOTAL, T3FREE, THYROIDAB in the last 72 hours.  Invalid input(s): FREET3 ------------------------------------------------------------------------------------------------------------------ No results for input(s): VITAMINB12, FOLATE, FERRITIN, TIBC, IRON, RETICCTPCT in the last 72 hours.  Coagulation profile  Recent Labs Lab 06/30/16 0437 06/30/16 0830  INR 1.07 1.13    No results for input(s): DDIMER in the last 72 hours.  Cardiac Enzymes No results for input(s): CKMB, TROPONINI, MYOGLOBIN in the last 168 hours.  Invalid input(s): CK ------------------------------------------------------------------------------------------------------------------ No results found for: BNP  Micro Results Recent Results (from the past 240 hour(s))  Blood Culture (routine x 2)     Status: None (Preliminary result)   Collection Time: 06/30/16  4:30 AM  Result Value Ref Range Status   Specimen Description BLOOD RIGHT ARM  Final   Special Requests BOTTLES DRAWN AEROBIC AND ANAEROBIC  Final   Culture NO GROWTH 2 DAYS  Final   Report Status PENDING  Incomplete  Blood Culture (routine x 2)     Status: None (Preliminary result)   Collection Time:  06/30/16  4:37 AM  Result Value Ref Range Status  Specimen Description BLOOD LEFT ARM  Final   Special Requests IN PEDIATRIC BOTTLE  Final   Culture NO GROWTH 2 DAYS  Final   Report Status PENDING  Incomplete  Urine culture     Status: Abnormal   Collection Time: 06/30/16  9:35 AM  Result Value Ref Range Status   Specimen Description URINE, RANDOM  Final   Special Requests NONE  Final   Culture MULTIPLE SPECIES PRESENT, SUGGEST RECOLLECTION (A)  Final   Report Status 07/01/2016 FINAL  Final    Radiology Reports Dg Chest 2 View  Result Date: 07/01/2016 CLINICAL DATA:  Cough X1 month; hx a-fib, HTN, non-smoker EXAM: CHEST  2 VIEW COMPARISON:  06/30/2016 FINDINGS: Right-sided transvenous pacemaker leads to the right atrium and right ventricle. Status post median sternotomy and valve replacement. The heart is enlarged and stable in configuration. Patchy infiltrates are identified at the lung bases bilaterally, stable since prior study. Infiltrate also suspected within the right middle lobe and lingula. There are bilateral pleural effusions. There wedge compression deformities of the thoracic and lumbar spine. IMPRESSION: Persistent bilateral infiltrates. Electronically Signed   By: Norva Pavlov M.D.   On: 07/01/2016 10:34   Dg Chest 2 View  Result Date: 06/30/2016 CLINICAL DATA:  83 y/o  F; cough and shortness of breath. EXAM: CHEST  2 VIEW COMPARISON:  04/15/2015 chest radiograph FINDINGS: Stable mild cardiomegaly. Aortic atherosclerosis with calcification. Mitral and tricuspid annuloplasty. Post sternotomy. Two lead pacemaker. Bones are diffusely demineralized. Right greater than left lung base consolidations likely represent pneumonia. IMPRESSION: Right greater than left lung base consolidations likely represent pneumonia. Possible small effusions. These results were called by telephone at the time of interpretation on 06/30/2016 at 4:33 am to Dr. Rochele Raring , who verbally acknowledged  these results. Electronically Signed   By: Mitzi Hansen M.D.   On: 06/30/2016 04:34    Time Spent in minutes  30   Millissa Deese K M.D on 07/03/2016 at 10:59 AM  Between 7am to 7pm - Pager - [phone]  After 7pm go to [email or web address] - password [email or web address]  [facility] -  Office  [phone]

## 2016-07-03 NOTE — Progress Notes (Signed)
Talked to social work, shelby, she stated she will call the family members and receive authorization, then will place calls to SNF  For bed placement  Janice [name]

## 2016-07-03 NOTE — Progress Notes (Signed)
MD returned page, aware of family decision to [facility] and refusal of SNF after talking to social work. MD also aware of pt wheelchair bound, unable to successfully preform oxygen tests. Pt on room air, saturation went as low as 94%. MD stated discharge will be tomorrow  Janice [name]

## 2016-07-03 NOTE — Progress Notes (Signed)
MD stated to contact social worker, stated pt needs to be discharged to a SNF. MD stated he talked to the niece and she was agreeable. Page made to social work awaiting call back  [employer] ProductsCristin Winn Muehl

## 2016-07-03 NOTE — Progress Notes (Signed)
Pt not alert and oriented, difficult to complete admission databases  Janice [name] Elige RadonBradley

## 2016-07-04 ENCOUNTER — Inpatient Hospital Stay (HOSPITAL_COMMUNITY): Payer: Medicare Other

## 2016-07-04 LAB — CBC
HEMATOCRIT: 32.8 % — AB (ref 36.0–46.0)
HEMOGLOBIN: 10.7 g/dL — AB (ref 12.0–15.0)
MCH: 28.9 pg (ref 26.0–34.0)
MCHC: 32.6 g/dL (ref 30.0–36.0)
MCV: 88.6 fL (ref 78.0–100.0)
Platelets: 167 10*3/uL (ref 150–400)
RBC: 3.7 MIL/uL — AB (ref 3.87–5.11)
RDW: 14.3 % (ref 11.5–15.5)
WBC: 6.5 10*3/uL (ref 4.0–10.5)

## 2016-07-04 LAB — BASIC METABOLIC PANEL
Anion gap: 6 (ref 5–15)
BUN: 16 mg/dL (ref 6–20)
CHLORIDE: 112 mmol/L — AB (ref 101–111)
CO2: 21 mmol/L — AB (ref 22–32)
CREATININE: 0.71 mg/dL (ref 0.44–1.00)
Calcium: 8.7 mg/dL — ABNORMAL LOW (ref 8.9–10.3)
GFR calc Af Amer: >60 mL/min (ref >60)
GFR calc non Af Amer: >60 mL/min (ref >60)
Glucose, Bld: 104 mg/dL — ABNORMAL HIGH (ref 65–99)
POTASSIUM: 4 mmol/L (ref 3.5–5.1)
Sodium: 139 mmol/L (ref 135–145)

## 2016-07-04 MED ORDER — AMOXICILLIN-POT CLAVULANATE 500-125 MG PO TABS: 1.0000 | ORAL_TABLET | Freq: Two times a day (BID) | ORAL | 0 refills | Status: DC

## 2016-07-04 MED ORDER — ALBUTEROL SULFATE (2.5 MG/3ML) 0.083% IN NEBU: 2.5000 mg | INHALATION_SOLUTION | Freq: Four times a day (QID) | RESPIRATORY_TRACT | 12 refills | Status: DC | PRN

## 2016-07-04 NOTE — Discharge Instructions (Signed)
Follow with Primary MD Thayer HeadingsMACKENZIE,BRIAN, MD in 4 days   Get CBC, CMP, 2 view Chest X ray checked  by Primary MD or SNF MD in 4 days ( we routinely change or add medications that can affect your baseline labs and fluid status, therefore we recommend that you get the mentioned basic workup next visit with your PCP, your PCP may decide not to get them or add new tests based on their clinical decision)  Activity: As tolerated with Full fall precautions use walker/cane & assistance as needed  Disposition ALF  Diet:   Heart Healthy with feeding assistance and aspiration precautions.  For Heart failure patients - Check your Weight same time everyday, if you gain over 2 pounds, or you develop in leg swelling, experience more shortness of breath or chest pain, call your Primary MD immediately. Follow Cardiac Low Salt Diet and 1.5 lit/day fluid restriction.  On your next visit with your primary care physician please Get Medicines reviewed and adjusted.  Please request your Prim.MD to go over all Hospital Tests and Procedure/Radiological results at the follow up, please get all Hospital records sent to your Prim MD by signing hospital release before you go home.  If you experience worsening of your admission symptoms, develop shortness of breath, life threatening emergency, suicidal or homicidal thoughts you must seek medical attention immediately by calling 911 or calling your MD immediately  if symptoms less severe.  You Must read complete instructions/literature along with all the possible adverse reactions/side effects for all the Medicines you take and that have been prescribed to you. Take any new Medicines after you have completely understood and accpet all the possible adverse reactions/side effects.   Do not drive, operate heavy machinery, perform activities at heights, swimming or participation in water activities or provide baby sitting services if your were admitted for syncope or siezures until  you have seen by Primary MD or a Neurologist and advised to do so again.  Do not drive when taking Pain medications.    Do not take more than prescribed Pain, Sleep and Anxiety Medications  Special Instructions: If you have smoked or chewed Tobacco  in the last 2 yrs please stop smoking, stop any regular Alcohol  and or any Recreational drug use.  Wear Seat belts while driving.   Please note  You were cared for by a hospitalist during your hospital stay. If you have any questions about your discharge medications or the care you received while you were in the hospital after you are discharged, you can call the unit and asked to speak with the hospitalist on call if the hospitalist that took care of you is not available. Once you are discharged, your primary care physician will handle any further medical issues. Please note that NO REFILLS for any discharge medications will be authorized once you are discharged, as it is imperative that you return to your primary care physician (or establish a relationship with a primary care physician if you do not have one) for your aftercare needs so that they can reassess your need for medications and monitor your lab values.

## 2016-07-04 NOTE — Clinical Social Work Placement (Signed)
   CLINICAL SOCIAL WORK PLACEMENT  NOTE  Date:  07/04/2016  Patient Details  Name: Janice [name] MRN: [id number] Date of Birth: 1922-02-23  Clinical Social Work is seeking post-discharge placement for this patient at the Assisted Living Facility level of care (*CSW will initial, date and re-position this form in  chart as items are completed):  Yes   Patient/family provided with [facility] Clinical Social Work Department's list of facilities offering this level of care within the geographic area requested by the patient (or if unable, by the patient's family).  Yes   Patient/family informed of their freedom to choose among providers that offer the needed level of care, that participate in Medicare, Medicaid or managed care program needed by the patient, have an available bed and are willing to accept the patient.  Yes   Patient/family informed of [facility]'s ownership interest [facility] and [facility], as well as of the fact that they are under no obligation to receive care at these facilities.  PASRR submitted to EDS on       PASRR number received on       Existing PASRR number confirmed on       FL2 transmitted to all facilities in geographic area requested by pt/family on       FL2 transmitted to all facilities within larger geographic area on       Patient informed that his/her managed care company has contracts with or will negotiate with certain facilities, including the following:        Yes   Patient/family informed of bed offers received.  Patient chooses bed at  ([facility])     Physician recommends and patient chooses bed at      Patient to be transferred to  ([facility]) on 07/04/16.  Patient to be transferred to facility by  [name](PTAR)     Patient family notified on 07/04/16 of transfer.  Name of family member notified:  Niece     PHYSICIAN       Additional Comment:     _______________________________________________ Norlene DuelBROWN, Malaiya Paczkowski B, LCSWA 07/04/2016, 1:59 PM

## 2016-07-04 NOTE — Progress Notes (Signed)
Patient is discharge to [facility]  accompanied by two [facility] personnel via stretcher.  Discharge documentation  given. All personal belongings given. Telemetry box and IV removed prior to discharge and site in good condition. O2 at 93% room air.

## 2016-07-04 NOTE — Discharge Summary (Signed)
Janice [name]:[id number] DOB: 02-14-1922 DOA: 06/30/2016  PCP: Thayer Headings, MD  Admit date: 06/30/2016  Discharge date: 07/04/2016  Admitted From: ALF  Disposition:  ALF   Recommendations for Outpatient Follow-up:   Follow up with PCP in 1-2 weeks  PCP Please obtain BMP/CBC, 2 view CXR in 1week,  (see Discharge instructions)   PCP Please follow up on the following pending results: None   Home Health: PT,RN   Equipment/Devices: O2  Consultations: None Discharge Condition: Guarded   CODE STATUS: DNR   Diet Recommendation:  Heart Healthy    Chief Complaint  Patient presents with  . Cough  . Generalized Body Aches     Brief history of present illness from the day of admission and additional interim summary    Janice Bolen Edingeris a 83 y.o.femalewith medical history significant for hypothyroidism, chronic atrial fibrillation Was on warfarin and was recently taken off, history of temporal arteritis, severe protein calorie malnutrition, pacemaker, normocytic anemia and GERD. She is a assisted-living resident who was admitted for pneumonia.                                                                 Hospital Course   1. Sepsis with Acute hypoxic respiratory failure due to CAP. Clinically better & improved on empiric antibiotics so far negative cultures, sepsis physiology has resolved, also got flutter valve along with IV fluids and supportive care. Ruled out influenza. She was cleared by Speech. At baseline she is extremely frail and weak and in long-term at risk for aspiration. She marginally qualified for home oxygen which she will be provided on as-needed basis and if needed continuous, she will be given 3 more days of oral antibiotics. Note as above she is extremely frail and I sincerely wanted her to go  to an acute rehabilitation or SNF however patient and daughter refused both. She will go to ALF with home health RN and PT.  2. ARF. Due to #1 above resolved with hydration.  3. Chronic atrial fibrillation. Possible sick sinus syndrome. Has pacemaker. [place] vasc 2 score of 4. Not on any rate control medication, recently was taken off of Coumadin, monitor.  4. Lower extremity swelling. Minimal. Apply TED stockings, negative venous ultrasound.  5. Hypothyroidism. On Synthroid continue.  6. History of temporal arteritis. Stable not on chronic steroids.  7. GERD. On PPI  8. Generalized weakness, extremely frail status, moderate to severe protein calorie malnutrition. All due to 83, supportive care, she and her daughter refused SNF will now go again back to her ALF., supportive care, she and her daughter refused SNF will now go again back to her ALF.  9. H/O L hip fracture with failed prosthesis - supportive care for several years.     Discharge diagnosis     Principal Problem:   Acute respiratory failure with hypoxia (HCC) Active Problems:  Atrial fibrillation (HCC)   Normocytic anemia   Hypothyroidism   Temporal arteritis (HCC)   Protein-calorie malnutrition, severe (HCC)   HCAP (healthcare-associated pneumonia)   Acute kidney injury (HCC)   Abnormal urinalysis   Acute delirium   Sepsis due to pneumonia [facility](HCC)    Discharge instructions    Discharge Instructions    Discharge instructions    Complete by:  As directed    Follow with Primary MD Thayer HeadingsMACKENZIE,BRIAN, MD in 4 days   Get CBC, CMP, 2 view Chest X ray checked  by Primary MD or SNF MD in 4 days ( we routinely change or add medications that can affect your baseline labs and fluid status, therefore we recommend that you get the mentioned basic workup next visit with your PCP, your PCP may decide not to get them or add new tests based on their clinical decision)  Activity: As tolerated with Full fall precautions use walker/cane & assistance as needed  Disposition ALF  Diet:   Heart  Healthy with feeding assistance and aspiration precautions.  For Heart failure patients - Check your Weight same time everyday, if you gain over 2 pounds, or you develop in leg swelling, experience more shortness of breath or chest pain, call your Primary MD immediately. Follow Cardiac Low Salt Diet and 1.5 lit/day fluid restriction.  On your next visit with your primary care physician please Get Medicines reviewed and adjusted.  Please request your Prim.MD to go over all Hospital Tests and Procedure/Radiological results at the follow up, please get all Hospital records sent to your Prim MD by signing hospital release before you go home.  If you experience worsening of your admission symptoms, develop shortness of breath, life threatening emergency, suicidal or homicidal thoughts you must seek medical attention immediately by calling 911 or calling your MD immediately  if symptoms less severe.  You Must read complete instructions/literature along with all the possible adverse reactions/side effects for all the Medicines you take and that have been prescribed to you. Take any new Medicines after you have completely understood and accpet all the possible adverse reactions/side effects.   Do not drive, operate heavy machinery, perform activities at heights, swimming or participation in water activities or provide baby sitting services if your were admitted for syncope or siezures until you have seen by Primary MD or a Neurologist and advised to do so again.  Do not drive when taking Pain medications.    Do not take more than prescribed Pain, Sleep and Anxiety Medications  Special Instructions: If you have smoked or chewed Tobacco  in the last 2 yrs please stop smoking, stop any regular Alcohol  and or any Recreational drug use.  Wear Seat belts while driving.   Please note  You were cared for by a hospitalist during your hospital stay. If you have any questions about your discharge medications  or the care you received while you were in the hospital after you are discharged, you can call the unit and asked to speak with the hospitalist on call if the hospitalist that took care of you is not available. Once you are discharged, your primary care physician will handle any further medical issues. Please note that NO REFILLS for any discharge medications will be authorized once you are discharged, as it is imperative that you return to your primary care physician (or establish a relationship with a primary care physician if you do not have one) for your aftercare needs so that they can reassess  your need for medications and monitor your lab values.   Increase activity slowly    Complete by:  As directed       Discharge Medications   Allergies as of 07/04/2016   No Known Allergies     Medication List    TAKE these medications   acetaminophen 325 MG tablet Commonly known as:  TYLENOL Take 650 mg by mouth every 6 (six) hours as needed for moderate pain.   albuterol (2.5 MG/3ML) 0.083% nebulizer solution Commonly known as:  PROVENTIL Take 3 mLs (2.5 mg total) by nebulization every 6 (six) hours as needed for wheezing or shortness of breath.   amoxicillin-clavulanate 500-125 MG tablet Commonly known as:  AUGMENTIN Take 1 tablet (500 mg total) by mouth 2 (two) times daily.   aspirin EC 81 MG tablet Take 81 mg by mouth daily.   beta carotene w/minerals tablet Take 1 tablet by mouth daily.   bisoprolol 5 MG tablet Commonly known as:  ZEBETA Take 5 mg by mouth 2 (two) times daily.   CALCIUM-VITAMIN D-MINERALS PO Take 1 tablet by mouth 2 (two) times daily.   furosemide 20 MG tablet Commonly known as:  LASIX Take 40 mg by mouth daily.   guaifenesin 100 MG/5ML syrup Commonly known as:  ROBITUSSIN Take 200 mg by mouth 3 (three) times daily as needed for cough.   guaiFENesin 600 MG 12 hr tablet Commonly known as:  MUCINEX Take 600 mg by mouth 2 (two) times daily as needed for  cough or to loosen phlegm.   levothyroxine 50 MCG tablet Commonly known as:  SYNTHROID, LEVOTHROID Take 50 mcg by mouth daily.   omeprazole 20 MG capsule Commonly known as:  PRILOSEC Take 20 mg by mouth daily.   RECLAST 5 MG/100ML Soln injection Generic drug:  zoledronic acid Inject 5 mg into the vein every 30 (thirty) days.   simvastatin 5 MG tablet Commonly known as:  ZOCOR Take 5 mg by mouth at bedtime.   traMADol 50 MG tablet Commonly known as:  ULTRAM Take 50 mg by mouth 3 (three) times daily.            Durable Medical Equipment        Start     Ordered   07/04/16 1028  For home use only DME Nebulizer/meds  Once    Question:  Patient needs a nebulizer to treat with the following condition  Answer:  SOB (shortness of breath)   07/04/16 1027   07/04/16 1027  DME Oxygen  Once    Question Answer Comment  Mode or (Route) Nasal cannula   Liters per Minute 2   Frequency Continuous (stationary and portable oxygen unit needed)   Oxygen conserving device Yes   Oxygen delivery system Gas      07/04/16 1027      Follow-up Information    [facility] Follow up.   Specialty:  Home Health Services Why:  They will do your home health care at [facility] information: [address] [place] [address] [phone]        Thayer Headings, MD. Schedule an appointment as soon as possible for a visit in 4 day(s).   Specialty:  Internal Medicine Contact information: [address] [place] [address] [phone]           Major procedures and Radiology Reports - PLEASE review detailed and final reports thoroughly  -  Venous US Legs - No DVT-SVT  Dg Chest 2 View  Result Date: 07/01/2016 CLINICAL DATA:  Cough X1 month; hx a-fib, HTN, non-smoker EXAM: CHEST  2 VIEW COMPARISON:  06/30/2016 FINDINGS: Right-sided transvenous pacemaker leads to the right atrium and right  ventricle. Status post median sternotomy and valve replacement. The heart is enlarged and stable in configuration. Patchy infiltrates are identified at the lung bases bilaterally, stable since prior study. Infiltrate also suspected within the right middle lobe and lingula. There are bilateral pleural effusions. There wedge compression deformities of the thoracic and lumbar spine. IMPRESSION: Persistent bilateral infiltrates. Electronically Signed   By: Norva Pavlov M.D.   On: 07/01/2016 10:34   Dg Chest 2 View  Result Date: 06/30/2016 CLINICAL DATA:  83 y/o  F; cough and shortness of breath. EXAM: CHEST  2 VIEW COMPARISON:  04/15/2015 chest radiograph FINDINGS: Stable mild cardiomegaly. Aortic atherosclerosis with calcification. Mitral and tricuspid annuloplasty. Post sternotomy. Two lead pacemaker. Bones are diffusely demineralized. Right greater than left lung base consolidations likely represent pneumonia. IMPRESSION: Right greater than left lung base consolidations likely represent pneumonia. Possible small effusions. These results were called by telephone at the time of interpretation on 06/30/2016 at 4:33 am to Dr. Rochele Raring , who verbally acknowledged these results. Electronically Signed   By: Mitzi Hansen M.D.   On: 06/30/2016 04:34   Dg Chest Port 1 View  Result Date: 07/04/2016 CLINICAL DATA:  Short of breath EXAM: PORTABLE CHEST 1 VIEW COMPARISON:  07/01/2016 FINDINGS: Sternotomy wires overlie normal cardiac silhouette. There is increased bilateral pleural effusions. Upper lungs are clear. LEFT lower lobe atelectasis. IMPRESSION: 1. Increased bilateral lower lobe pleural effusions. 2. Persistent LEFT lower lobe atelectasis Electronically Signed   By: Genevive Bi M.D.   On: 07/04/2016 07:42    Micro Results     Recent Results (from the past 240 hour(s))  Blood Culture (routine x 2)     Status: None (Preliminary result)   Collection Time: 06/30/16  4:30 AM  Result Value  Ref Range Status   Specimen Description BLOOD RIGHT ARM  Final   Special Requests BOTTLES DRAWN AEROBIC AND ANAEROBIC  Final   Culture NO GROWTH 3 DAYS  Final   Report Status PENDING  Incomplete  Blood Culture (routine x 2)     Status: None (Preliminary result)   Collection Time: 06/30/16  4:37 AM  Result Value Ref Range Status   Specimen Description BLOOD LEFT ARM  Final   Special Requests IN PEDIATRIC BOTTLE  Final   Culture NO GROWTH 3 DAYS  Final   Report Status PENDING  Incomplete  Urine culture     Status: Abnormal   Collection Time: 06/30/16  9:35 AM  Result Value Ref Range Status   Specimen Description URINE, RANDOM  Final   Special Requests NONE  Final   Culture MULTIPLE SPECIES PRESENT, SUGGEST RECOLLECTION (A)  Final   Report Status 07/01/2016 FINAL  Final    Today   Subjective    Janice [name] today has no headache,no chest abdominal pain,no new weakness tingling or numbness, feels much better wants to go home today.     Objective   Blood pressure (!) 133/58, pulse 70, temperature 98.3 F (36.8 C), temperature source Oral, resp. rate 18, height 5' 7.5" (1.715 m), weight 48.1 kg (106 lb 1.6 oz), SpO2 92 %.   Intake/Output Summary (Last 24 hours) at 07/04/16 1028 Last data filed at 07/04/16 0900  Gross per  24 hour  Intake             1397 ml  Output              450 ml  Net              947 ml    Exam Awake Alert, Oriented x 3, No new F.N deficits, Normal affect [place].AT,PERRAL Supple Neck,No JVD, No cervical lymphadenopathy appriciated.  Symmetrical Chest wall movement, Good air movement bilaterally, few rales RRR,No Gallops,Rubs or new Murmurs, No Parasternal Heave +ve B.Sounds, Abd Soft, Non tender, No organomegaly appriciated, No rebound -guarding or rigidity. No Cyanosis, Clubbing or edema, No new Rash or bruise   Data Review   CBC w Diff: Lab Results  Component Value Date   WBC 6.5 07/04/2016   HGB 10.7 (L) 07/04/2016   HCT 32.8 (L)  07/04/2016   PLT 167 07/04/2016   LYMPHOPCT 7 06/30/2016   MONOPCT 5 06/30/2016   EOSPCT 0 06/30/2016   BASOPCT 0 06/30/2016    CMP: Lab Results  Component Value Date   NA 139 07/04/2016   K 4.0 07/04/2016   CL 112 (H) 07/04/2016   CO2 21 (L) 07/04/2016   BUN 16 07/04/2016   CREATININE 0.71 07/04/2016   PROT 5.8 (L) 06/30/2016   ALBUMIN 3.4 (L) 06/30/2016   BILITOT 0.4 06/30/2016   ALKPHOS 75 06/30/2016   AST 24 06/30/2016   ALT 13 (L) 06/30/2016  .   Total Time in preparing paper work, data evaluation and todays exam - 35 minutes  Leroy Sea M.D on 07/04/2016 at 10:28 AM  [facility]   Office  [phone]

## 2016-07-04 NOTE — Clinical Social Work Note (Signed)
Clinical Social Worker facilitated patient discharge including contacting patient family and facility to confirm patient discharge plans.  Clinical information faxed to facility and family agreeable with plan. CSW arranged ambulance transport via [facility] to [facility] [place]. RN to call report prior to discharge.  Clinical Social Worker will sign off for now as social work intervention is no longer needed. Please consult us again if new need arises.  Maha Fischel B. Gean QuintBrown,MSW, LCSWA Clinical Social Work Dept Weekend Social Worker [phone] 1:57 PM

## 2016-07-04 NOTE — Progress Notes (Signed)
The following prescriptions was faxed and successfully transmitted to [facility]: Amoxicillin and Albuterol.

## 2016-07-04 NOTE — Care Management Note (Signed)
Case Management Note  Patient Details  Name: Janice [name] MRN: [id number] Date of Birth: 08-20-1921  Subjective/Objective:    Pt POA has declined all DME until pt returns to [facility] and can be reassessed. [facility] by weekday CM.               Action/Plan:CM will sign off for now but will be available should additional discharge needs arise or disposition change.    Expected Discharge Date:  07/04/16               Expected Discharge Plan:  Home w Home Health Services  In-House Referral:     Discharge planning Services  CM Consult  Post Acute Care Choice:    Choice [facility] to:  [facility] Guardian  DME Arranged:  Patient refused services DME Agency:     HH Arranged:  RN, PT, OT, Nurse's Aide, Social Work [facility]  Status of Service:  In process, will continue to follow  If discussed at Long Length of Stay Meetings, dates discussed:    Additional Comments:  Yvone NeuCrutchfield, Rodnisha Blomgren M, RN 07/04/2016, 2:29 PM

## 2016-07-05 LAB — CULTURE, BLOOD (ROUTINE X 2)
CULTURE: NO GROWTH
Culture: NO GROWTH

## 2017-02-25 ENCOUNTER — Inpatient Hospital Stay (HOSPITAL_COMMUNITY)
Admission: EM | Admit: 2017-02-25 | Discharge: 2017-02-27 | DRG: 602 | Disposition: A | Discharge status: Skilled Nursing Facility | Payer: Medicare Other | Attending: Family Medicine | Admitting: Family Medicine

## 2017-02-25 ENCOUNTER — Encounter (HOSPITAL_COMMUNITY): Payer: Self-pay | Admitting: *Deleted

## 2017-02-25 DIAGNOSIS — Z90710 Acquired absence of both cervix and uterus: Secondary | ICD-10-CM

## 2017-02-25 DIAGNOSIS — Z882 Allergy status to sulfonamides status: Secondary | ICD-10-CM | POA: Diagnosis not present

## 2017-02-25 DIAGNOSIS — I10 Essential (primary) hypertension: Secondary | ICD-10-CM

## 2017-02-25 DIAGNOSIS — L03115 Cellulitis of right lower limb: Secondary | ICD-10-CM | POA: Diagnosis not present

## 2017-02-25 DIAGNOSIS — Z803 Family history of malignant neoplasm of breast: Secondary | ICD-10-CM

## 2017-02-25 DIAGNOSIS — E876 Hypokalemia: Secondary | ICD-10-CM | POA: Diagnosis present

## 2017-02-25 DIAGNOSIS — E871 Hypo-osmolality and hyponatremia: Secondary | ICD-10-CM | POA: Diagnosis not present

## 2017-02-25 DIAGNOSIS — E43 Unspecified severe protein-calorie malnutrition: Secondary | ICD-10-CM | POA: Diagnosis not present

## 2017-02-25 DIAGNOSIS — N189 Chronic kidney disease, unspecified: Secondary | ICD-10-CM | POA: Diagnosis present

## 2017-02-25 DIAGNOSIS — Z681 Body mass index (BMI) 19 or less, adult: Secondary | ICD-10-CM | POA: Diagnosis not present

## 2017-02-25 DIAGNOSIS — K219 Gastro-esophageal reflux disease without esophagitis: Secondary | ICD-10-CM | POA: Diagnosis present

## 2017-02-25 DIAGNOSIS — E039 Hypothyroidism, unspecified: Secondary | ICD-10-CM | POA: Diagnosis not present

## 2017-02-25 DIAGNOSIS — E785 Hyperlipidemia, unspecified: Secondary | ICD-10-CM | POA: Diagnosis not present

## 2017-02-25 DIAGNOSIS — T148XXA Other injury of unspecified body region, initial encounter: Secondary | ICD-10-CM | POA: Diagnosis not present

## 2017-02-25 DIAGNOSIS — Z9849 Cataract extraction status, unspecified eye: Secondary | ICD-10-CM

## 2017-02-25 DIAGNOSIS — L8990 Pressure ulcer of unspecified site, unspecified stage: Secondary | ICD-10-CM | POA: Diagnosis present

## 2017-02-25 DIAGNOSIS — L0390 Cellulitis, unspecified: Secondary | ICD-10-CM | POA: Diagnosis present

## 2017-02-25 DIAGNOSIS — Z9842 Cataract extraction status, left eye: Secondary | ICD-10-CM

## 2017-02-25 DIAGNOSIS — I129 Hypertensive chronic kidney disease with stage 1 through stage 4 chronic kidney disease, or unspecified chronic kidney disease: Secondary | ICD-10-CM | POA: Diagnosis present

## 2017-02-25 DIAGNOSIS — Z950 Presence of cardiac pacemaker: Secondary | ICD-10-CM | POA: Diagnosis present

## 2017-02-25 DIAGNOSIS — Z79899 Other long term (current) drug therapy: Secondary | ICD-10-CM | POA: Diagnosis not present

## 2017-02-25 DIAGNOSIS — Z9181 History of falling: Secondary | ICD-10-CM

## 2017-02-25 DIAGNOSIS — L24A9 Irritant contact dermatitis due friction or contact with other specified body fluids: Secondary | ICD-10-CM

## 2017-02-25 DIAGNOSIS — Z9049 Acquired absence of other specified parts of digestive tract: Secondary | ICD-10-CM

## 2017-02-25 DIAGNOSIS — I482 Chronic atrial fibrillation: Secondary | ICD-10-CM | POA: Diagnosis present

## 2017-02-25 DIAGNOSIS — Z9841 Cataract extraction status, right eye: Secondary | ICD-10-CM

## 2017-02-25 DIAGNOSIS — Z7982 Long term (current) use of aspirin: Secondary | ICD-10-CM | POA: Diagnosis not present

## 2017-02-25 DIAGNOSIS — I4891 Unspecified atrial fibrillation: Secondary | ICD-10-CM | POA: Diagnosis present

## 2017-02-25 LAB — CBC WITH DIFFERENTIAL/PLATELET
Basophils Absolute: 0 K/uL (ref 0.0–0.1)
Basophils Relative: 0 %
Eosinophils Absolute: 0.1 K/uL (ref 0.0–0.7)
Eosinophils Relative: 2 %
HCT: 54.3 % — ABNORMAL HIGH (ref 36.0–46.0)
Hemoglobin: 17.9 g/dL — ABNORMAL HIGH (ref 12.0–15.0)
Lymphocytes Relative: 11 %
Lymphs Abs: 0.4 K/uL — ABNORMAL LOW (ref 0.7–4.0)
MCH: 28.4 pg (ref 26.0–34.0)
MCHC: 33 g/dL (ref 30.0–36.0)
MCV: 86.2 fL (ref 78.0–100.0)
Monocytes Absolute: 0.2 K/uL (ref 0.1–1.0)
Monocytes Relative: 6 %
Neutro Abs: 2.6 K/uL (ref 1.7–7.7)
Neutrophils Relative %: 81 %
Platelets: 126 K/uL — ABNORMAL LOW (ref 150–400)
RBC: 6.3 MIL/uL — ABNORMAL HIGH (ref 3.87–5.11)
RDW: 13.8 % (ref 11.5–15.5)
WBC: 3.2 K/uL — ABNORMAL LOW (ref 4.0–10.5)

## 2017-02-25 LAB — CREATININE, SERUM
Creatinine, Ser: 1.03 mg/dL — ABNORMAL HIGH (ref 0.44–1.00)
GFR calc Af Amer: 53 mL/min — ABNORMAL LOW
GFR calc non Af Amer: 45 mL/min — ABNORMAL LOW

## 2017-02-25 LAB — CBC
HEMATOCRIT: 29.9 % — AB (ref 36.0–46.0)
HEMOGLOBIN: 9.6 g/dL — AB (ref 12.0–15.0)
MCH: 27.8 pg (ref 26.0–34.0)
MCHC: 32.1 g/dL (ref 30.0–36.0)
MCV: 86.7 fL (ref 78.0–100.0)
PLATELETS: 257 10*3/uL (ref 150–400)
RBC: 3.45 MIL/uL — AB (ref 3.87–5.11)
RDW: 13.8 % (ref 11.5–15.5)
WBC: 9.1 10*3/uL (ref 4.0–10.5)

## 2017-02-25 LAB — BASIC METABOLIC PANEL WITH GFR
Anion gap: 9 (ref 5–15)
BUN: 23 mg/dL — ABNORMAL HIGH (ref 6–20)
CO2: 24 mmol/L (ref 22–32)
Calcium: 8.5 mg/dL — ABNORMAL LOW (ref 8.9–10.3)
Chloride: 103 mmol/L (ref 101–111)
Creatinine, Ser: 1 mg/dL (ref 0.44–1.00)
GFR calc Af Amer: 55 mL/min — ABNORMAL LOW
GFR calc non Af Amer: 47 mL/min — ABNORMAL LOW
Glucose, Bld: 92 mg/dL (ref 65–99)
Potassium: 3.9 mmol/L (ref 3.5–5.1)
Sodium: 136 mmol/L (ref 135–145)

## 2017-02-25 MED ORDER — SODIUM CHLORIDE 0.9 % IV SOLN
1.5000 g | Freq: Four times a day (QID) | INTRAVENOUS | Status: DC
  Administered 2017-02-25 – 2017-02-26 (×4): 1.5 g via INTRAVENOUS
  Filled 2017-02-25 (×6): qty 1.5

## 2017-02-25 MED ORDER — ENOXAPARIN SODIUM 40 MG/0.4ML ~~LOC~~ SOLN
40.0000 mg | SUBCUTANEOUS | Status: DC
  Administered 2017-02-25: 40 mg via SUBCUTANEOUS
  Filled 2017-02-25: qty 0.4

## 2017-02-25 MED ORDER — VANCOMYCIN HCL IN DEXTROSE 1-5 GM/200ML-% IV SOLN
1000.0000 mg | Freq: Once | INTRAVENOUS | Status: AC
  Administered 2017-02-25: 1000 mg via INTRAVENOUS
  Filled 2017-02-25: qty 200

## 2017-02-25 MED ORDER — ALBUTEROL SULFATE (2.5 MG/3ML) 0.083% IN NEBU: 2.5000 mg | INHALATION_SOLUTION | Freq: Four times a day (QID) | RESPIRATORY_TRACT | Status: DC | PRN

## 2017-02-25 MED ORDER — CLINDAMYCIN PHOSPHATE 600 MG/50ML IV SOLN
600.0000 mg | Freq: Once | INTRAVENOUS | Status: AC
  Administered 2017-02-25: 600 mg via INTRAVENOUS
  Filled 2017-02-25: qty 50

## 2017-02-25 MED ORDER — ASPIRIN EC 81 MG PO TBEC
81.0000 mg | DELAYED_RELEASE_TABLET | Freq: Every day | ORAL | Status: DC
  Administered 2017-02-25 – 2017-02-27 (×3): 81 mg via ORAL
  Filled 2017-02-25 (×3): qty 1

## 2017-02-25 MED ORDER — FUROSEMIDE 20 MG PO TABS
40.0000 mg | ORAL_TABLET | Freq: Every day | ORAL | Status: DC
  Administered 2017-02-25 – 2017-02-27 (×3): 40 mg via ORAL
  Filled 2017-02-25 (×3): qty 2

## 2017-02-25 MED ORDER — BISOPROLOL FUMARATE 5 MG PO TABS
5.0000 mg | ORAL_TABLET | Freq: Every day | ORAL | Status: DC
  Administered 2017-02-25 – 2017-02-27 (×3): 5 mg via ORAL
  Filled 2017-02-25 (×3): qty 1

## 2017-02-25 MED ORDER — PANTOPRAZOLE SODIUM 40 MG PO TBEC
40.0000 mg | DELAYED_RELEASE_TABLET | Freq: Every day | ORAL | Status: DC
  Administered 2017-02-25 – 2017-02-27 (×3): 40 mg via ORAL
  Filled 2017-02-25 (×3): qty 1

## 2017-02-25 MED ORDER — VANCOMYCIN HCL IN DEXTROSE 1-5 GM/200ML-% IV SOLN
1000.0000 mg | INTRAVENOUS | Status: DC
  Filled 2017-02-25: qty 200

## 2017-02-25 MED ORDER — LEVOTHYROXINE SODIUM 50 MCG PO TABS
50.0000 ug | ORAL_TABLET | Freq: Every day | ORAL | Status: DC
  Administered 2017-02-26 – 2017-02-27 (×2): 50 ug via ORAL
  Filled 2017-02-25 (×2): qty 1

## 2017-02-25 MED ORDER — SODIUM CHLORIDE 0.9 % IV SOLN
INTRAVENOUS | Status: DC
  Administered 2017-02-25 – 2017-02-26 (×2): via INTRAVENOUS

## 2017-02-25 MED ORDER — SIMVASTATIN 5 MG PO TABS
5.0000 mg | ORAL_TABLET | Freq: Every day | ORAL | Status: DC
  Administered 2017-02-25 – 2017-02-26 (×2): 5 mg via ORAL
  Filled 2017-02-25 (×3): qty 1

## 2017-02-25 NOTE — ED Notes (Signed)
Attempted report 

## 2017-02-25 NOTE — ED Notes (Signed)
Report called  

## 2017-02-25 NOTE — H&P (Signed)
[facility] History and Physical  Janice [name]:[id number] DOB: 1921/08/12 DOA: 02/25/2017  Referring physician:  PCP: Thayer Headings, MD  Specialists:   Chief Complaint: leg pain, ulcers   HPI: Janice [name] is a 83 y.o. female with PMH of HTN, Hypothyroidism, a fib not on anticoagulation (likely due to fall), DJD, not well ambulatory at baseline presented from dermatology office for admission for cellulitis. Patient states that she has had progressive worsening of her right leg wounds for several days. Patient lives at ALF and [facility] nurse comes every Monday for wound care. Today, she had worsening of her wounds with redness, pain, and swelling and presented to dermatologist who referred for ED. Patient denies fevers, but had som chills. No recent trauma, or injury. She reports chronic mild cough, no acute chest pains. No focal weakness. No nausea, vomiting or diarrhea, no abdominal pains.   -ED: started on iv antibiotic treatment for cellulitis. hospital ist is called for admission   Review of Systems: The patient denies anorexia, fever, weight loss,, vision loss, decreased hearing, hoarseness, chest pain, syncope, dyspnea on exertion, peripheral edema, balance deficits, hemoptysis, abdominal pain, melena, hematochezia, severe indigestion/heartburn, hematuria, incontinence, genital sores, muscle weakness, suspicious skin lesions, transient blindness, difficulty walking, depression, unusual weight change, abnormal bleeding, enlarged lymph nodes, angioedema, and breast masses.    Past Medical History:  Diagnosis Date  . Angina   . Atrial fibrillation (HCC)   . GERD (gastroesophageal reflux disease)   . Hip fracture (HCC)   . Hyperlipemia   . Hypertension   . Hypokalemia   . Hyponatremia   . Hypothyroidism   . Pacemaker   . Renal disorder    chronic kidney disorder  . Shortness of breath   . Temporal arteritis [facility])    Past Surgical History:  Procedure Laterality Date   . ABDOMINAL HYSTERECTOMY    . CATARACT EXTRACTION    . CHOLECYSTECTOMY    . INSERT / REPLACE / REMOVE PACEMAKER    . [employer]    . MV repair     at [facility]  . ORIF ACETABULAR FRACTURE     Right  . PPM     Social History:  reports that she has never smoked. She has never used smokeless tobacco. She reports that she does not drink alcohol or use drugs. ALF;  where does patient live--home, ALF, SNF? and with whom if at home? No;  Can patient participate in ADLs?  Allergies  Allergen Reactions  . Sulfa Antibiotics Nausea Only    Family History  Problem Relation Age of Onset  . Breast cancer Mother     (be sure to complete)  Prior to Admission medications   Medication Sig Start Date End Date Taking? Authorizing Provider  aspirin EC 81 MG tablet Take 81 mg by mouth daily.   Yes [provider]  bisoprolol (ZEBETA) 5 MG tablet Take 5 mg by mouth 2 (two) times daily.    Yes [provider]  furosemide (LASIX) 20 MG tablet Take 40 mg by mouth daily.    Yes [provider]  levothyroxine (SYNTHROID, LEVOTHROID) 50 MCG tablet Take 50 mcg by mouth daily.   Yes [provider]  omeprazole (PRILOSEC) 20 MG capsule Take 20 mg by mouth daily.   Yes [provider]  simvastatin (ZOCOR) 5 MG tablet Take 5 mg by mouth at bedtime.     Yes [provider]  traMADol (ULTRAM) 50 MG tablet Take 50 mg  by mouth 3 (three) times daily.   Yes [provider]  acetaminophen (TYLENOL) 325 MG tablet Take 650 mg by mouth every 6 (six) hours as needed for moderate pain.     [provider]  albuterol (PROVENTIL) (2.5 MG/3ML) 0.083% nebulizer solution Take 3 mLs (2.5 mg total) by nebulization every 6 (six) hours as needed for wheezing or shortness of breath. 07/04/16   Leroy Sea, MD  amoxicillin-clavulanate (AUGMENTIN) 500-125 MG tablet Take 1 tablet (500 mg total) by mouth 2 (two) times daily. 07/04/16   Leroy Sea, MD   beta carotene w/minerals (OCUVITE) tablet Take 1 tablet by mouth daily.     [provider]  Calcium Carbonate-Vit D-Min (CALCIUM-VITAMIN D-MINERALS PO) Take 1 tablet by mouth 2 (two) times daily.    [provider]  guaiFENesin (MUCINEX) 600 MG 12 hr tablet Take 600 mg by mouth 2 (two) times daily as needed for cough or to loosen phlegm.    [provider]  guaifenesin (ROBITUSSIN) 100 MG/5ML syrup Take 200 mg by mouth 3 (three) times daily as needed for cough.    [provider]  zoledronic acid (RECLAST) 5 MG/100ML SOLN injection Inject 5 mg into the vein every 30 (thirty) days.    [provider]   Physical Exam: Vitals:   02/25/17 1245 02/25/17 1300  BP: (!) 125/58 135/71  Pulse: 69 72  Resp:    Temp:    SpO2: 99% 97%     General:  Alert. No distress   Eyes: eom-I, perrla   ENT: no oral ulcers   Neck: supple, no JVD  Cardiovascular: s1,s2 mild systolic murmur  Respiratory: CTA BL  Abdomen: soft, nt, dn   Skin: no extensive right LE superficial ulcerations   Musculoskeletal: right leg edema   Psychiatric: no hallucinations   Neurologic: CN 2-12 intact. Motor 5/5 BL  Labs on Admission:  Basic Metabolic Panel:  Recent Labs Lab 02/25/17 1201  NA 136  K 3.9  CL 103  CO2 24  GLUCOSE 92  BUN 23*  CREATININE 1.00  CALCIUM 8.5*   Liver Function Tests: No results for input(s): AST, ALT, ALKPHOS, BILITOT, PROT, ALBUMIN in the last 168 hours. No results for input(s): LIPASE, AMYLASE in the last 168 hours. No results for input(s): AMMONIA in the last 168 hours. CBC:  Recent Labs Lab 02/25/17 1201  WBC 3.2*  NEUTROABS 2.6  HGB 17.9*  HCT 54.3*  MCV 86.2  PLT 126*   Cardiac Enzymes: No results for input(s): CKTOTAL, CKMB, CKMBINDEX, TROPONINI in the last 168 hours.  BNP (last 3 results) No results for input(s): BNP in the last 8760 hours.  ProBNP (last 3 results) No results for input(s): PROBNP in the  last 8760 hours.  CBG: No results for input(s): GLUCAP in the last 168 hours.  Radiological Exams on Admission: No results found.  EKG: Independently reviewed.   Assessment/Plan Active Problems:   Hypothyroidism   Protein-calorie malnutrition, severe (HCC)   Cellulitis   83 y/o female with PMH of HTN, Hypothyroidism, a fib not on anticoagulation (likely due to fall), DJD, not well ambulatory at baseline presented from dermatology office for admission for cellulitis.    Right LE cellulitis. No obvious abscess. Will start iv antibiotic treatment, deescalate to single antibiotic in 24-48 hrsconsult wound care. Obtain doppler ultrasound to r/o dvt  HTN. Stable cont home regimen. Monitor  Hypothyroidism. Cont levothyroxine   None.  if consultant consulted, please document name and  whether formally or informally consulted  Code Status: full (must indicate code status--if unknown or must be presumed, indicate so) Family Communication: d/w patient, her family friends at the bedside (indicate person spoken with, if applicable, with phone number if by telephone) Disposition Plan: pend clinical improvement, PT eval (indicate anticipated LOS)  Time spent: >45 minutes   Esperanza Sheets [facility] Pager [phone]  If 7PM-7AM, please contact night-coverage [email or web address] Password [email or web address] 02/25/2017, 1:48 PM

## 2017-02-25 NOTE — ED Provider Notes (Signed)
[facility]-EMERGENCY DEPT Provider Note   CSN: [id number] Arrival date & time: 02/25/17  1058     History   Chief Complaint Chief Complaint  Patient presents with  . Cellulitis    HPI Janice [name] is a 83 y.o. female.  Patient is a 83 year old female with a history of chronic kidney disease, hypertension, hyperlipidemia, atrial fibrillation, chronic wound on the right lower extremity presenting from her dermatologist office today for concern for cellulitis. Patient's friend who is accompanying her at bedside states that she has had this progressive wound for months and she is not 100% sure when it got worse. However now she has red streaking going up her leg which is new. Patient lives in assisted living and has a Education officer, environmental come out every Monday and rewrap her legs. Today she is going to the dermatologist for another associated reason and because she was soaking through her bandage removed and discovered the wound has become worse and now has red streaking. Patient denies fever, decreased appetite, generalized weakness or other complaints at this time.   The history is provided by the patient and a friend.    Past Medical History:  Diagnosis Date  . Angina   . Atrial fibrillation (HCC)   . GERD (gastroesophageal reflux disease)   . Hip fracture (HCC)   . Hyperlipemia   . Hypertension   . Hypokalemia   . Hyponatremia   . Hypothyroidism   . Pacemaker   . Renal disorder    chronic kidney disorder  . Shortness of breath   . Temporal arteritis [facility])     Patient Active Problem List   Diagnosis Date Noted  . Acute respiratory failure with hypoxia (HCC) 06/30/2016  . HCAP (healthcare-associated pneumonia) 06/30/2016  . Acute kidney injury (HCC) 06/30/2016  . Abnormal urinalysis 06/30/2016  . Acute delirium 06/30/2016  . Sepsis due to pneumonia (HCC) 06/30/2016  . Sepsis (HCC)   . Hypothyroidism 04/19/2012  . Temporal arteritis (HCC) 04/19/2012  . Protein-calorie  malnutrition, severe (HCC) 04/19/2012  . Pacemaker 02/08/2012  . Normocytic anemia 12/29/2011  . Atrial fibrillation (HCC) 12/23/2009  . GERD 12/23/2009    Past Surgical History:  Procedure Laterality Date  . ABDOMINAL HYSTERECTOMY    . CATARACT EXTRACTION    . CHOLECYSTECTOMY    . INSERT / REPLACE / REMOVE PACEMAKER    . [employer]    . MV repair     at [facility]  . ORIF ACETABULAR FRACTURE     Right  . PPM      OB History    No data available       Home Medications    Prior to Admission medications   Medication Sig Start Date End Date Taking? Authorizing Provider  acetaminophen (TYLENOL) 325 MG tablet Take 650 mg by mouth every 6 (six) hours as needed for moderate pain.     [provider]  albuterol (PROVENTIL) (2.5 MG/3ML) 0.083% nebulizer solution Take 3 mLs (2.5 mg total) by nebulization every 6 (six) hours as needed for wheezing or shortness of breath. 07/04/16   Leroy Sea, MD  amoxicillin-clavulanate (AUGMENTIN) 500-125 MG tablet Take 1 tablet (500 mg total) by mouth 2 (two) times daily. 07/04/16   Leroy Sea, MD  aspirin EC 81 MG tablet Take 81 mg by mouth daily.    [provider]  beta carotene w/minerals (OCUVITE) tablet Take 1 tablet by mouth daily.     [provider]  bisoprolol (ZEBETA)  5 MG tablet Take 5 mg by mouth 2 (two) times daily.     [provider]  Calcium Carbonate-Vit D-Min (CALCIUM-VITAMIN D-MINERALS PO) Take 1 tablet by mouth 2 (two) times daily.    [provider]  furosemide (LASIX) 20 MG tablet Take 40 mg by mouth daily.     [provider]  guaiFENesin (MUCINEX) 600 MG 12 hr tablet Take 600 mg by mouth 2 (two) times daily as needed for cough or to loosen phlegm.    [provider]  guaifenesin (ROBITUSSIN) 100 MG/5ML syrup Take 200 mg by mouth 3 (three) times daily as needed for cough.    [provider]  levothyroxine (SYNTHROID, LEVOTHROID) 50 MCG  tablet Take 50 mcg by mouth daily.    [provider]  omeprazole (PRILOSEC) 20 MG capsule Take 20 mg by mouth daily.    [provider]  simvastatin (ZOCOR) 5 MG tablet Take 5 mg by mouth at bedtime.      [provider]  traMADol (ULTRAM) 50 MG tablet Take 50 mg by mouth 3 (three) times daily.    [provider]  zoledronic acid (RECLAST) 5 MG/100ML SOLN injection Inject 5 mg into the vein every 30 (thirty) days.    [provider]    Family History Family History  Problem Relation Age of Onset  . Breast cancer Mother     Social History Social History  Substance Use Topics  . Smoking status: Never Smoker  . Smokeless tobacco: Never Used  . Alcohol use No     Allergies   Patient has no known allergies.   Review of Systems Review of Systems  All other systems reviewed and are negative.    Physical Exam Updated Vital Signs BP 121/64 (BP Location: Right Arm)   Pulse 78   Temp 98 F (36.7 C) (Oral)   Resp 14   SpO2 100%   Physical Exam  Constitutional: She is oriented to person, place, and time. She appears well-developed and well-nourished. No distress.  HENT:  Head: Normocephalic and atraumatic.  Mouth/Throat: Oropharynx is clear and moist.  Eyes: Pupils are equal, round, and reactive to light. Conjunctivae and EOM are normal.  Neck: Normal range of motion. Neck supple.  Cardiovascular: Normal rate and intact distal pulses.  An irregularly irregular rhythm present.  No murmur heard. Pulmonary/Chest: Effort normal and breath sounds normal. No respiratory distress. She has no wheezes. She has no rales.  Abdominal: Soft. She exhibits no distension. There is no tenderness. There is no rebound and no guarding.  Musculoskeletal: Normal range of motion. She exhibits edema. She exhibits no tenderness.  Large wound extending from the posterior distal calf up to the before meals surface of the knee on the right. There is  surrounding erythema and warmth that extends up the thigh.  Skin changes consistent with venous stasis on the left leg but no warmth or swelling  Neurological: She is alert and oriented to person, place, and time.  Skin: Skin is warm and dry. No rash noted. No erythema.  Psychiatric: She has a normal mood and affect. Her behavior is normal.  Nursing note and vitals reviewed.    ED Treatments / Results  Labs (all labs ordered are listed, but only abnormal results are displayed) Labs Reviewed  CBC WITH DIFFERENTIAL/PLATELET - Abnormal; Notable for the following:       Result Value   WBC 3.2 (*)    RBC 6.30 (*)  Hemoglobin 17.9 (*)    HCT 54.3 (*)    Platelets 126 (*)    Lymphs Abs 0.4 (*)    All other components within normal limits  BASIC METABOLIC PANEL - Abnormal; Notable for the following:    BUN 23 (*)    Calcium 8.5 (*)    GFR calc non Af Amer 47 (*)    GFR calc Af Amer 55 (*)    All other components within normal limits    EKG  EKG Interpretation None       Radiology No results found.  Procedures Procedures (including critical care time)  Medications Ordered in ED Medications  0.9 %  sodium chloride infusion (not administered)  clindamycin (CLEOCIN) IVPB 600 mg (600 mg Intravenous New Bag/Given 02/25/17 1214)     Initial Impression / Assessment and Plan / ED Course  I have reviewed the triage vital signs and the nursing notes.  Pertinent labs & imaging results that were available during my care of the patient were reviewed by me and considered in my medical decision making (see chart for details).    Pt presenting with worsening chronic wound despite weekly dressing changes from wound care and new cellulitis.  Pt denies any systemic sx and no signs of abscess or nec fasciitis.  Labs with mild leukopenia and mild hemoconcentration.  Pt started on clinda as she has been on no meds yet.  Will admit for further care.  Also there is a neighbor present with the  patient and she is concerned that she needs a higher level of care than assisted living. We'll discuss with case management.  Final Clinical Impressions(s) / ED Diagnoses   Final diagnoses:  Cellulitis of right lower extremity  Wound drainage    New Prescriptions New Prescriptions   No medications on file     Gwyneth Sprout, MD 02/25/17 1307

## 2017-02-25 NOTE — Progress Notes (Signed)
Pharmacy Antibiotic Note  Janice [name] is a 83 y.o. female admitted on 02/25/2017 with cellulitis.  Pharmacy has been consulted for Vancomycin dosing. nCrCl ~ 45 mL/min. WBC 3.2.   Plan: -Vancomycin 1 gm IV once, then 1000 mg IV q 24 hours  -Monitor CBC, renal fx, cultures and clinical progress -VT at SS      Temp (24hrs), Avg:98 F (36.7 C), Min:98 F (36.7 C), Max:98 F (36.7 C)   Recent Labs Lab 02/25/17 1201  WBC 3.2*  CREATININE 1.00    CrCl cannot be calculated (Unknown ideal weight.).    Allergies  Allergen Reactions  . Sulfa Antibiotics Nausea Only    Antimicrobials this admission: Vanc 10/5>>  Dose adjustments this admission: None  Microbiology results:   Thank you for allowing pharmacy to be a part of this patient's care.  Vinnie Level, PharmD., BCPS Clinical Pharmacist Pager [phone]

## 2017-02-25 NOTE — ED Triage Notes (Signed)
Pt BIB EMS from dermatology appointment bilateral lower leg cellulitis. Pt states that her doctor sent her out because she thought her legs were infected. Pt alert; resp e/u. Afebrile on assessment. NAD.   Pt resides at [facility] .

## 2017-02-26 DIAGNOSIS — I480 Paroxysmal atrial fibrillation: Secondary | ICD-10-CM | POA: Diagnosis not present

## 2017-02-26 DIAGNOSIS — I10 Essential (primary) hypertension: Secondary | ICD-10-CM

## 2017-02-26 DIAGNOSIS — L8990 Pressure ulcer of unspecified site, unspecified stage: Secondary | ICD-10-CM | POA: Insufficient documentation

## 2017-02-26 DIAGNOSIS — E039 Hypothyroidism, unspecified: Secondary | ICD-10-CM | POA: Diagnosis not present

## 2017-02-26 DIAGNOSIS — L03115 Cellulitis of right lower limb: Secondary | ICD-10-CM | POA: Diagnosis not present

## 2017-02-26 MED ORDER — ENOXAPARIN SODIUM 30 MG/0.3ML ~~LOC~~ SOLN
30.0000 mg | SUBCUTANEOUS | Status: DC
  Administered 2017-02-26: 30 mg via SUBCUTANEOUS
  Filled 2017-02-26: qty 0.3

## 2017-02-26 MED ORDER — SODIUM CHLORIDE 0.9 % IV SOLN: 1.5000 g | Freq: Two times a day (BID) | INTRAVENOUS | Status: DC

## 2017-02-26 MED ORDER — VANCOMYCIN HCL IN DEXTROSE 1-5 GM/200ML-% IV SOLN
1000.0000 mg | INTRAVENOUS | Status: DC
  Filled 2017-02-26: qty 200

## 2017-02-26 NOTE — Consult Note (Signed)
WOC Nurse wound consult note Reason for Consult: infectious wound to right lateral leg, present on admission.  Was getting weekly HH care from [facility] in ALF.  Noted sulfa allergy. Is on IV antibiotics.  LEft lateral lower leg above malleolus with scabbed abrasion present Wound type:trauma wounds from falls Pressure Injury POA: NA Measurement:Right leg:  14 cm x 5 cm x 0.2 cm  LEft laterl malleolus:  1 cm x 0.3 cm scabbed Wound ZOX:WRUE pink nongranulating.  Yellow effluent is easily cleansed from wound bed.   Drainage (amount, consistency, odor) Minimal yelllow fibrin slough Periwound:dry skin bilateral lower legs Dressing procedure/placement/frequency:CLeanse wounds to bilateral lower legs with soap and water.  Apply calcium algainte dressing for absorption and atraumatic dressing removal.  Cover with 4x4 gauze and kerlix/tape.  Change daily.  Will not follow at this time.  Please re-consult if needed.  Maple Hudson RN BSN CWON Pager [phone]

## 2017-02-26 NOTE — Progress Notes (Addendum)
Physical Therapy Evaluation Patient Details Name: Janice [name] MRN: [id number] DOB: Mar 24, 1922 Today's Date: 02/26/2017   History of Present Illness  Janice [name] is a 83 y/o female admitted on 02/25/17 due to cellulitis. Patient is a poor historian; according to medical chart patient is a resident of an ALF with limited mobility prior to admission - nurse reporting that patient used W/C for primary means of mobility. Patient with a past medical history significant for HTN, afib, angina, CKD, and pacemaker.  Clinical Impression  Pt admitted with above diagnosis. Pt currently with functional limitations due to the deficits listed below (see PT Problem List). Patient in bed upon arrival, with PT speaking to nurse regarding mobility status, Per nurse patient uses w/c for primary means of mobility at ALF, with physical assist for all other mobility and transfers. Patient today able to roll R <> L with +1 assist and patient initiating attempt of movement, however, unable to complete independently. Pt will benefit from skilled PT to increase their independence and safety with mobility to allow discharge to the venue listed below.       Follow Up Recommendations Supervision/Assistance - 24 hour;Supervision for mobility/OOB;Home health PT    Equipment Recommendations  None recommended by PT    Recommendations for Other Services OT consult     Precautions / Restrictions Precautions Precautions: Fall Restrictions Weight Bearing Restrictions: No      Mobility  Bed Mobility Overal bed mobility: Needs Assistance Bed Mobility: Rolling Rolling: Min assist         General bed mobility comments: patient reaching for bed rails to help assist with rolling  Transfers                    Ambulation/Gait                Stairs            Wheelchair Mobility    Modified Rankin (Stroke Patients Only)       Balance                                              Pertinent Vitals/Pain Pain Assessment: No/denies pain    Home Living Family/patient expects to be discharged to:: Assisted living               Home Equipment: Wheelchair - [employer] - 2 wheels Additional Comments: Patient reports ambulating short distances wtih RW, however, poor historian    Prior Function Level of Independence: Needs assistance   Gait / Transfers Assistance Needed: patient reporting she required assist for all bed mobility, transfers, and ambulation  ADL's / Homemaking Assistance Needed: unknown        Hand Dominance        Extremity/Trunk Assessment   Upper Extremity Assessment Upper Extremity Assessment: Defer to OT evaluation    Lower Extremity Assessment Lower Extremity Assessment: Generalized weakness (some diffiuclty lifting B LE from bed against gravity)       Communication   Communication: HOH  Cognition Arousal/Alertness: Awake/alert Behavior During Therapy: WFL for tasks assessed/performed Overall Cognitive Status: Within Functional Limits for tasks assessed                                        General  Comments General comments (skin integrity, edema, etc.): B LE wounds - clean dressings applied    Exercises     Assessment/Plan    PT Assessment Patient needs continued PT services  PT Problem List Decreased strength;Decreased activity tolerance;Decreased balance;Decreased mobility;Decreased knowledge of use of DME       PT Treatment Interventions DME instruction;Functional mobility training;Therapeutic activities;Therapeutic exercise;Balance training;Gait training;Patient/family education    PT Goals (Current goals can be found in the Care Plan section)  Acute Rehab PT Goals Patient Stated Goal: none stated Time For Goal Achievement: 03/12/17 Potential to Achieve Goals: Good    Frequency Min 3X/week   Barriers to discharge        Co-evaluation               AM-PAC PT "6  Clicks" Daily Activity  Outcome Measure Difficulty turning over in bed (including adjusting bedclothes, sheets and blankets)?: Unable Difficulty moving from lying on back to sitting on the side of the bed? : Unable Difficulty sitting down on and standing up from a chair with arms (e.g., wheelchair, bedside commode, etc,.)?: Unable Help needed moving to and from a bed to chair (including a wheelchair)?: A Lot Help needed walking in hospital room?: Total Help needed climbing 3-5 steps with a railing? : Total 6 Click Score: 7    End of Session   Activity Tolerance: Patient tolerated treatment well Patient left: in chair;with call bell/phone within reach;with nursing/sitter in room Nurse Communication: Mobility status PT Visit Diagnosis: Other abnormalities of gait and mobility (R26.89);Muscle weakness (generalized) (M62.81);Difficulty in walking, not elsewhere classified (R26.2)    Time: 7829-5621 PT Time Calculation (min) (ACUTE ONLY): 19 min   Charges:   PT Evaluation $PT Eval Moderate Complexity: 1 Mod      G-Codes: Functional Assessment Tool Used: AM-PAC 6 Clicks Basic Mobility Functional limitation: Mobility: Walking and moving around Mobility: Walking and moving around current status: CM Mobility: Walking and moving around Goal status: CL    Kipp Laurence, PT, DPT 02/26/17 3:41 PM

## 2017-02-26 NOTE — Progress Notes (Signed)
Pharmacy Antibiotic Note  Janice [name] is a 83 y.o. female admitted on 02/25/2017 with cellulitis.  Pharmacy has been consulted for vancomycin dosing.  Patient is also on Unasyn per MD.  Given updated weight, patient's calculated CrCL is lower, at 29 ml/min.  She remains afebrile and her WBC is WNL.  Plan: Change vanc to 1gm IV Q48H for goal trough 10-15 mcg/mL Change Unasyn to 1.5gm IV Q12H Monitor renal fxn, clinical progress, vanc trough as indicated Reduce Lovenox to  SQ Q24H F/U with de-escalation   Height:  (167.6 cm) Weight: 117 lb (53.1 kg) IBW/kg (Calculated) : 59.3  Temp (24hrs), Avg:98.3 F (36.8 C), Min:98 F (36.7 C), Max:98.8 F (37.1 C)   Recent Labs Lab 02/25/17 1201 02/25/17 1615 02/25/17 1914  WBC 3.2*  --  9.1  CREATININE 1.00 1.03*  --     Estimated Creatinine Clearance: 28.6 mL/min (A) (by C-G formula based on SCr of 1.03 mg/dL (H)).    Allergies  Allergen Reactions  . Sulfa Antibiotics Nausea Only    Vanc 10/5 >> Unasyn 10/5 >>   Mercedez Boule D. Laney Potash, PharmD, BCPS Pager:  [phone] 02/26/2017, 10:39 AM

## 2017-02-26 NOTE — Progress Notes (Signed)
  PROGRESS NOTE  Janice [name]:[id number] DOB: [birth year] DOA: 02/25/2017 PCP: Thayer Headings, MD  Brief Narrative: 89yow PMH CKD, afib not anticoagulation, sent to ED from derm office for concern for cellulitis. Lives at ALF and [facility] RN sees weekly for wound care. 10/5 round looked worse with redness, pain and was sent to ED. Admitted for RLE cellulitis.  Assessment/Plan Cellulitis, chronic RLE wound -appears to be improving. Narrow abx to vancomycin. Appreciate wound care RN recommendations. -f/u RLE u/s  Essential HTN -stable. Continue BB and furosemide  Hyperlipidemia -continue simvastatin  Atrial fibrillation, s/p PPM -continue ASA, bisoprolol  Hypothyroidism  -continue levothyroxine   F/u PT recommendations  DVT prophylaxis: enoxaparin Code Status: full Family Communication: none Disposition Plan: pending PT evaluation    Brendia Sacks, MD  [facility] Direct contact: [phone] --Via amion app OR  --[email or web address]; password [email or web address]  7PM-7AM contact night coverage as above 02/26/2017, 11:08 AM  LOS: 1 day   Consultants:    Procedures:    Antimicrobials:  Unasyn 10/5 >>   Vancomycin 10/5 >>  Interval history/Subjective: No pain. Feels ok.   Objective: Vitals: afebrile, VSS, 98.0, 70, 115/55, 97% on RA  Exam:    Constitutional:  Appears calm and comfortable Eyes:  pupils and irises appear normal ENMT:  grossly normal hearing  Lips appear normal Respiratory:  CTA bilaterally, no w/r/r.  Respiratory effort normal.  Cardiovascular:  RRR, no m/r/g No LE extremity edema   Musculoskeletal:  Digits/nails: no cyanosis, infection of hands BLE strength and tone normal, no atrophy, no abnormal movements Skin:  Marked borders BLE show regression of erythema. Wound RLE is dressed. Psychiatric:  judgement and insight appear normal Mental status Mood, affect appropriate  I have personally reviewed the following:   Labs:  Hgb  stable 9.6, normal WBC  BMP yesterday unremarkable  Scheduled Meds: . aspirin EC  81 mg Oral Daily  . bisoprolol  5 mg Oral Daily  . enoxaparin (LOVENOX) injection  30 mg Subcutaneous Q24H  . furosemide  40 mg Oral Daily  . levothyroxine  50 mcg Oral QAC breakfast  . pantoprazole  40 mg Oral Daily  . simvastatin  5 mg Oral QHS   Continuous Infusions: . [START ON 02/27/2017] vancomycin      Principal Problem:   Cellulitis of right leg Active Problems:   Atrial fibrillation (HCC)   Pacemaker   Hypothyroidism   Protein-calorie malnutrition, severe (HCC)   Pressure injury of skin   Benign essential HTN   LOS: 1 day

## 2017-02-27 ENCOUNTER — Observation Stay (HOSPITAL_COMMUNITY): Payer: Medicare Other

## 2017-02-27 ENCOUNTER — Encounter (HOSPITAL_COMMUNITY): Payer: Medicare Other

## 2017-02-27 DIAGNOSIS — L03115 Cellulitis of right lower limb: Secondary | ICD-10-CM | POA: Diagnosis not present

## 2017-02-27 DIAGNOSIS — L0390 Cellulitis, unspecified: Secondary | ICD-10-CM | POA: Diagnosis present

## 2017-02-27 DIAGNOSIS — I10 Essential (primary) hypertension: Secondary | ICD-10-CM | POA: Diagnosis not present

## 2017-02-27 MED ORDER — DOXYCYCLINE HYCLATE 100 MG PO CAPS: 100.0000 mg | ORAL_CAPSULE | Freq: Two times a day (BID) | ORAL | 0 refills | Status: DC

## 2017-02-27 NOTE — Care Management Obs Status (Signed)
MEDICARE OBSERVATION STATUS NOTIFICATION   Patient Details  Name: Janice [name] MRN: [id number] Date of Birth: 1921-11-10   Medicare Observation Status Notification Given:  Yes    Vivion Romano, Derrill Memo, RN 02/27/2017, 10:18 AM

## 2017-02-27 NOTE — Care Management CC44 (Signed)
Condition Code 44 Documentation Completed  Patient Details  Name: Janice [name] MRN: [id number] Date of Birth: 16-Jul-1921   Condition Code 44 given:  Yes Patient signature on Condition Code 44 notice:  Yes Documentation of 2 MD's agreement:  Yes Code 44 added to claim:  Yes    Brennon Otterness, Derrill Memo, RN 02/27/2017, 10:19 AM

## 2017-02-27 NOTE — Care Management Note (Addendum)
Case Management Note  Patient Details  Name: Janice [name] MRN: [id number] Date of Birth: 1921-12-10  Subjective/Objective:    83 y.o.  admitted from  [facility] at [facility],  with cellulitis to be discharged with resumption of her [facility] and HHPT services.  LM with [facility] agency )to make them aware of her return .                Action/Plan: CM will sign off for now but will be available should additional discharge needs arise or disposition change.    Expected Discharge Date:  02/27/17               Expected Discharge Plan:     In-House Referral:     Discharge planning Services  CM Consult  Post Acute Care Choice:  NA Choice offered to:  Patient  DME Arranged:    DME Agency:     HH Arranged:  RN, PT HH Agency:     Status of Service:  Completed, signed off  If discussed at Long Length of Stay Meetings, dates discussed:    Additional Comments:  Yvone Neu, RN 02/27/2017, 10:23 AM

## 2017-02-27 NOTE — Progress Notes (Signed)
Clinical Social Worker facilitated patient discharge including contacting patient family and facility to confirm patient discharge plans.  Clinical information faxed to facility and family agreeable with plan.  CSW arranged ambulance transport via [facility] to [facility] at [facility]. Per RN patient need [facility] transport since pt is unable to ambulate on her own .  RN to call [phone] for report prior to discharge.  Clinical Social Worker will sign off for now as social work intervention is no longer needed. Please consult [place] again if new need arises.  Marrianne Mood, MSW, [employer] [phone]

## 2017-02-27 NOTE — Progress Notes (Signed)
Faxed discharge summary to facility by [facility] with social work

## 2017-02-27 NOTE — Discharge Summary (Signed)
Physician Discharge Summary  ROSAMAE [name] YNW:[id number] DOB: 1921/09/26 DOA: 02/25/2017  PCP: Thayer Headings, MD  Admit date: 02/25/2017 Discharge date: 02/27/2017  Recommendations for Outpatient Follow-up:  1. Follow-up resolution of cellulitis, continue care for chronic wound right lower extremity 2. Continue wound care as per outpatient instructions  Follow-up Information    Thayer Headings, MD. Schedule an appointment as soon as possible for a visit in 1 week(s).   Specialty:  Internal Medicine Contact information: [address] [place] [address] [phone]            Discharge Diagnoses:  1. Right lower extremity cellulitis 2. Chronic right lower extremity wound 3. Essential hypertension 4. Hyperlipidemia  Discharge Condition: improved Disposition: return to ALF with HHPT  Diet recommendation: regular  Filed Weights   02/26/17 1010  Weight: 53.1 kg (117 lb)    History of present illness:  89yow PMH CKD, afib not anticoagulation, sent to ED from derm office for concern for cellulitis. Lives at ALF and [facility] RN sees weekly for wound care. 10/5 round looked worse with redness, pain and was sent to ED. Admitted for RLE cellulitis.  Hospital Course:  Patient was treated with empiric abx with rapid clinical improvement. Comorbidities remained stable. Hospitalization was uncomplicated.  Cellulitis right lower extremity -Rapidly resolving. Change to oral antibiotics. No lower extremity edema. No indication for right lower extremity venous Doppler this point. No evidence to suggest DVT.  Essential hypertension remained stable, continue beta blocker and Lasix. Hyperlipidemia continue treatment with simvastatin  Antimicrobials:  Unasyn 10/5 >> 10/6  Vancomycin 10/5 >> 10/6  Doxycycline 10/7 >> 10/12   Today's assessment: S: feels well without complaints O: Vitals: afebrile, 98.0, 17, 70, 141/65  Constitutional:  Appears calm and  comfortable Respiratory:  CTA bilaterally, no w/r/r.  Respiratory effort normal.  Cardiovascular:  RRR, no m/r/g No BLE extremity edema   Musculoskeletal:  Moves BLE to command Skin:  Resolution of erythema on the right leg is noted.No significant edema noted. Chronic calf wound right leg visualized. No evidence of complications at this point. No erythema is noted at the left leg. No edema. Psychiatric:  Mental status Mood, affect appropriate  Discharge Instructions  Discharge Instructions    Diet general    Complete by:  As directed    Discharge instructions    Complete by:  As directed    Call your physician or seek immediate medical attention for pain, swelling, redness or worsening of condition.   Increase activity slowly    Complete by:  As directed      Allergies as of 02/27/2017      Reactions   Sulfa Antibiotics Nausea Only      Medication List    STOP taking these medications   cephALEXin 500 MG capsule Commonly known as:  KEFLEX   pseudoephedrine-guaifenesin 60-600 MG 12 hr tablet Commonly known as:  MUCINEX D     TAKE these medications   acetaminophen 325 MG tablet Commonly known as:  TYLENOL Take 650 mg by mouth every 6 (six) hours as needed for moderate pain.   aspirin EC 81 MG tablet Take 81 mg by mouth every evening.   beta carotene w/minerals tablet Take 1 tablet by mouth daily.   bisoprolol 5 MG tablet Commonly known as:  ZEBETA Take 5 mg by mouth 2 (two) times daily.   CALCIUM-VITAMIN D-MINERALS PO Take 1 tablet by mouth 2 (two) times daily.   doxycycline 100 MG capsule Commonly known  as:  VIBRAMYCIN Take 1 capsule (100 mg total) by mouth 2 (two) times daily.   furosemide 20 MG tablet Commonly known as:  LASIX Take 40 mg by mouth daily.   guaiFENesin-dextromethorphan 100-10 MG/5ML syrup Commonly known as:  ROBITUSSIN DM Take 10 mLs by mouth every 4 (four) hours as needed for cough.   levothyroxine 50 MCG tablet Commonly known  as:  SYNTHROID, LEVOTHROID Take 50 mcg by mouth daily.   omeprazole 20 MG capsule Commonly known as:  PRILOSEC Take 20 mg by mouth daily.   RECLAST 5 MG/100ML Soln injection Generic drug:  zoledronic acid Inject 5 mg into the vein every 30 (thirty) days.   simvastatin 5 MG tablet Commonly known as:  ZOCOR Take 5 mg by mouth at bedtime.   traMADol 50 MG tablet Commonly known as:  ULTRAM Take 50 mg by mouth 2 (two) times daily.      Allergies  Allergen Reactions  . Sulfa Antibiotics Nausea Only    The results of significant diagnostics from this hospitalization (including imaging, microbiology, ancillary and laboratory) are listed below for reference.      Labs: Basic Metabolic Panel:  Recent Labs Lab 02/25/17 1201 02/25/17 1615  NA 136  --   K 3.9  --   CL 103  --   CO2 24  --   GLUCOSE 92  --   BUN 23*  --   CREATININE 1.00 1.03*  CALCIUM 8.5*  --    CBC:  Recent Labs Lab 02/25/17 1201 02/25/17 1914  WBC 3.2* 9.1  NEUTROABS 2.6  --   HGB 17.9* 9.6*  HCT 54.3* 29.9*  MCV 86.2 86.7  PLT 126* 257    Principal Problem:   Cellulitis of right leg Active Problems:   Atrial fibrillation (HCC)   Pacemaker   Hypothyroidism   Protein-calorie malnutrition, severe (HCC)   Pressure injury of skin   Benign essential HTN   Time coordinating discharge: 35 minutes  Signed:  Brendia Sacks, MD [facility] 02/27/2017, 9:51 AM

## 2017-02-27 NOTE — Progress Notes (Signed)
D/c via ambulance to [facility], VSS pain 2/10. breathing regular and unlabored on room air.  bialteral feet RLE and sacral dsg cdi. All dressing have been changed.  Left messages for two on call RN staff at the facility states there only CNA on site today.  Left vms with two on call RN.

## 2017-02-27 NOTE — Clinical Social Work Note (Signed)
Clinical Social Work Assessment  Patient Details  Name: Janice [name] MRN: [id number] Date of Birth: Mar 31, 1922  Date of referral:  02/27/17               Reason for consult:  Facility Placement, Discharge Planning                Permission sought to share information with:  Family Supports Permission granted to share information::  Yes, Verbal Permission Granted  Name::     Janice [name]  Agency::  [facility]  Relationship::  nephew  Contact Information:  [phone]  Housing/Transportation Living arrangements for the past 2 months:  [place] of Information:  Patient Patient Interpreter Needed:  None Criminal Activity/Legal Involvement Pertinent to Current Situation/Hospitalization:  No - Comment as needed Significant Relationships:  Other Family Members Lives with:  Facility Resident Do you feel safe going back to the place where you live?  Yes Need for family participation in patient care:  Yes (Comment)  Care giving concerns:  No family at bedside. Patient is from [place]. CSW confirmed with facility that she has been there for years    Facilities manager / plan:  CSW met patient at bedside to talk about disposition plan. Patient is hard of hearing. CSW asked patient if she was okay with discharging back to facility and she stated she was. Patient stated she wanted to go by [facility] since patients family/friends were at church. CSW explained the cost and asked if friends could come and pick her up after church. Patient stated she suppose they could.    Employment status:  Retired Nurse, adult PT Recommendations:  Not assessed at this time (PT recomends pt to go back to Independent living) Information / Referral to community resources:  Other (Comment Required) (Independent living)  Patient/Family's Response to care:  Patient was appreciative of CSW coming in to talk to her  Patient/Family's Understanding of and  Emotional Response to Diagnosis, Current Treatment, and Prognosis:  Patient has clear understanding of recent hospitalization and limitations.  Emotional Assessment Appearance:  Appears stated age Attitude/Demeanor/Rapport:  Other Affect (typically observed):  Pleasant Orientation:  Oriented to Situation, Oriented to  Time, Oriented to Place, Oriented to Self Alcohol / Substance use:  Not Applicable Psych involvement (Current and /or in the community):  No (Comment)  Discharge Needs  Concerns to be addressed:  No discharge needs identified Readmission within the last 30 days:  No Current discharge risk:  None Barriers to Discharge:  No Barriers Identified   Wende Neighbors, LCSW 02/27/2017, 10:39 AM

## 2017-03-01 ENCOUNTER — Encounter (HOSPITAL_COMMUNITY): Payer: Self-pay | Admitting: Family Medicine

## 2017-03-01 ENCOUNTER — Emergency Department (HOSPITAL_COMMUNITY): Payer: Medicare Other

## 2017-03-01 ENCOUNTER — Inpatient Hospital Stay (HOSPITAL_COMMUNITY)
Admission: EM | Admit: 2017-03-01 | Discharge: 2017-03-05 | DRG: 493 | Disposition: A | Discharge status: Skilled Nursing Facility | Payer: Medicare Other | Attending: Internal Medicine | Admitting: Internal Medicine

## 2017-03-01 DIAGNOSIS — Z23 Encounter for immunization: Secondary | ICD-10-CM | POA: Diagnosis present

## 2017-03-01 DIAGNOSIS — W19XXXA Unspecified fall, initial encounter: Secondary | ICD-10-CM

## 2017-03-01 DIAGNOSIS — E039 Hypothyroidism, unspecified: Secondary | ICD-10-CM | POA: Diagnosis present

## 2017-03-01 DIAGNOSIS — D649 Anemia, unspecified: Secondary | ICD-10-CM | POA: Diagnosis present

## 2017-03-01 DIAGNOSIS — R0902 Hypoxemia: Secondary | ICD-10-CM | POA: Diagnosis present

## 2017-03-01 DIAGNOSIS — Y92129 Unspecified place in nursing home as the place of occurrence of the external cause: Secondary | ICD-10-CM

## 2017-03-01 DIAGNOSIS — Z419 Encounter for procedure for purposes other than remedying health state, unspecified: Secondary | ICD-10-CM

## 2017-03-01 DIAGNOSIS — S82832A Other fracture of upper and lower end of left fibula, initial encounter for closed fracture: Secondary | ICD-10-CM | POA: Diagnosis present

## 2017-03-01 DIAGNOSIS — Z803 Family history of malignant neoplasm of breast: Secondary | ICD-10-CM | POA: Diagnosis not present

## 2017-03-01 DIAGNOSIS — Z66 Do not resuscitate: Secondary | ICD-10-CM | POA: Diagnosis not present

## 2017-03-01 DIAGNOSIS — Z79899 Other long term (current) drug therapy: Secondary | ICD-10-CM

## 2017-03-01 DIAGNOSIS — E785 Hyperlipidemia, unspecified: Secondary | ICD-10-CM | POA: Diagnosis present

## 2017-03-01 DIAGNOSIS — S82202A Unspecified fracture of shaft of left tibia, initial encounter for closed fracture: Secondary | ICD-10-CM | POA: Diagnosis not present

## 2017-03-01 DIAGNOSIS — I4891 Unspecified atrial fibrillation: Secondary | ICD-10-CM | POA: Diagnosis present

## 2017-03-01 DIAGNOSIS — N289 Disorder of kidney and ureter, unspecified: Secondary | ICD-10-CM | POA: Diagnosis present

## 2017-03-01 DIAGNOSIS — L03115 Cellulitis of right lower limb: Secondary | ICD-10-CM | POA: Diagnosis present

## 2017-03-01 DIAGNOSIS — Z7982 Long term (current) use of aspirin: Secondary | ICD-10-CM

## 2017-03-01 DIAGNOSIS — S82102A Unspecified fracture of upper end of left tibia, initial encounter for closed fracture: Secondary | ICD-10-CM | POA: Diagnosis present

## 2017-03-01 DIAGNOSIS — S82402A Unspecified fracture of shaft of left fibula, initial encounter for closed fracture: Secondary | ICD-10-CM | POA: Diagnosis not present

## 2017-03-01 DIAGNOSIS — D62 Acute posthemorrhagic anemia: Secondary | ICD-10-CM | POA: Diagnosis not present

## 2017-03-01 DIAGNOSIS — S82192A Other fracture of upper end of left tibia, initial encounter for closed fracture: Secondary | ICD-10-CM | POA: Diagnosis present

## 2017-03-01 DIAGNOSIS — D72828 Other elevated white blood cell count: Secondary | ICD-10-CM | POA: Diagnosis present

## 2017-03-01 DIAGNOSIS — I10 Essential (primary) hypertension: Secondary | ICD-10-CM | POA: Diagnosis present

## 2017-03-01 DIAGNOSIS — W050XXA Fall from non-moving wheelchair, initial encounter: Secondary | ICD-10-CM | POA: Diagnosis present

## 2017-03-01 DIAGNOSIS — Z01818 Encounter for other preprocedural examination: Secondary | ICD-10-CM

## 2017-03-01 DIAGNOSIS — K219 Gastro-esophageal reflux disease without esophagitis: Secondary | ICD-10-CM | POA: Diagnosis present

## 2017-03-01 DIAGNOSIS — T148XXA Other injury of unspecified body region, initial encounter: Secondary | ICD-10-CM

## 2017-03-01 DIAGNOSIS — I480 Paroxysmal atrial fibrillation: Secondary | ICD-10-CM | POA: Diagnosis not present

## 2017-03-01 LAB — BASIC METABOLIC PANEL
ANION GAP: 13 (ref 5–15)
BUN: 26 mg/dL — AB (ref 6–20)
CHLORIDE: 101 mmol/L (ref 101–111)
CO2: 24 mmol/L (ref 22–32)
Calcium: 8.5 mg/dL — ABNORMAL LOW (ref 8.9–10.3)
Creatinine, Ser: 1.15 mg/dL — ABNORMAL HIGH (ref 0.44–1.00)
GFR calc Af Amer: 46 mL/min — ABNORMAL LOW (ref >60)
GFR calc non Af Amer: 40 mL/min — ABNORMAL LOW (ref >60)
GLUCOSE: 124 mg/dL — AB (ref 65–99)
POTASSIUM: 3.4 mmol/L — AB (ref 3.5–5.1)
Sodium: 138 mmol/L (ref 135–145)

## 2017-03-01 LAB — CBC
HEMATOCRIT: 27 % — AB (ref 36.0–46.0)
HEMOGLOBIN: 9 g/dL — AB (ref 12.0–15.0)
MCH: 28.8 pg (ref 26.0–34.0)
MCHC: 33.3 g/dL (ref 30.0–36.0)
MCV: 86.5 fL (ref 78.0–100.0)
Platelets: 306 10*3/uL (ref 150–400)
RBC: 3.12 MIL/uL — AB (ref 3.87–5.11)
RDW: 14.2 % (ref 11.5–15.5)
WBC: 16.2 10*3/uL — AB (ref 4.0–10.5)

## 2017-03-01 LAB — PROTIME-INR
INR: 1.05
PROTHROMBIN TIME: 13.6 s (ref 11.4–15.2)

## 2017-03-01 MED ORDER — MORPHINE SULFATE (PF) 2 MG/ML IV SOLN: 0.5000 mg | INTRAVENOUS | Status: DC | PRN

## 2017-03-01 MED ORDER — PANTOPRAZOLE SODIUM 40 MG PO TBEC
40.0000 mg | DELAYED_RELEASE_TABLET | Freq: Every day | ORAL | Status: DC
Start: 2017-03-02 — End: 2017-03-04
  Administered 2017-03-03 – 2017-03-04 (×2): 40 mg via ORAL
  Filled 2017-03-01 (×2): qty 1

## 2017-03-01 MED ORDER — SIMVASTATIN 10 MG PO TABS
5.0000 mg | ORAL_TABLET | Freq: Every day | ORAL | Status: DC
  Administered 2017-03-02 – 2017-03-05 (×4): 5 mg via ORAL
  Filled 2017-03-01 (×4): qty 1

## 2017-03-01 MED ORDER — GUAIFENESIN-DM 100-10 MG/5ML PO SYRP
10.0000 mL | ORAL_SOLUTION | ORAL | Status: DC | PRN
  Administered 2017-03-02: 10 mL via ORAL
  Filled 2017-03-01: qty 10

## 2017-03-01 MED ORDER — BISACODYL 5 MG PO TBEC: 5.0000 mg | DELAYED_RELEASE_TABLET | Freq: Every day | ORAL | Status: DC | PRN

## 2017-03-01 MED ORDER — PROSIGHT PO TABS
1.0000 | ORAL_TABLET | Freq: Every day | ORAL | Status: DC
  Administered 2017-03-03 – 2017-03-05 (×3): 1 via ORAL
  Filled 2017-03-01 (×3): qty 1

## 2017-03-01 MED ORDER — POTASSIUM CHLORIDE CRYS ER 20 MEQ PO TBCR
20.0000 meq | EXTENDED_RELEASE_TABLET | Freq: Once | ORAL | Status: AC
  Administered 2017-03-01: 20 meq via ORAL
  Filled 2017-03-01: qty 1

## 2017-03-01 MED ORDER — SENNOSIDES-DOCUSATE SODIUM 8.6-50 MG PO TABS
1.0000 | ORAL_TABLET | Freq: Every evening | ORAL | Status: DC | PRN
  Administered 2017-03-03: 1 via ORAL
  Filled 2017-03-01: qty 1

## 2017-03-01 MED ORDER — DOXYCYCLINE HYCLATE 100 MG PO CAPS: 100.0000 mg | ORAL_CAPSULE | Freq: Two times a day (BID) | ORAL | Status: DC

## 2017-03-01 MED ORDER — SODIUM CHLORIDE 0.9 % IV SOLN
INTRAVENOUS | Status: AC
  Administered 2017-03-01: 23:00:00 via INTRAVENOUS

## 2017-03-01 MED ORDER — ACETAMINOPHEN 325 MG PO TABS
650.0000 mg | ORAL_TABLET | Freq: Once | ORAL | Status: AC
  Administered 2017-03-01: 650 mg via ORAL
  Filled 2017-03-01: qty 2

## 2017-03-01 MED ORDER — LEVOTHYROXINE SODIUM 50 MCG PO TABS
50.0000 ug | ORAL_TABLET | Freq: Every day | ORAL | Status: DC
  Administered 2017-03-03 – 2017-03-05 (×3): 50 ug via ORAL
  Filled 2017-03-01 (×4): qty 1

## 2017-03-01 MED ORDER — BISOPROLOL FUMARATE 5 MG PO TABS
5.0000 mg | ORAL_TABLET | Freq: Two times a day (BID) | ORAL | Status: DC
  Administered 2017-03-01 – 2017-03-05 (×8): 5 mg via ORAL
  Filled 2017-03-01 (×9): qty 1

## 2017-03-01 NOTE — ED Notes (Signed)
Darl Pikes from lab called, patient has antibody issues so if blood is needed there will be a delay.

## 2017-03-01 NOTE — H&P (Signed)
History and Physical    Janice [name] UJW:[id number] DOB: 1921/09/26 DOA: 03/01/2017  PCP: Thayer Headings, MD   Patient coming from: Nursing Home   Chief Complaint: Left leg pain after fall   HPI: Janice [name] is a 83 y.o. female with medical history significant for atrial fibrillation no longer on anticoagulation, mild renal insufficiency, hypothyroidism,hypertension, and recent hospital admission for cellulitis of the right lower extremity, now presenting to emergency department from nursing home with severe left leg pain after a fall. Patient was in her usual state, completing a course of doxycycline for her cellulitis, but otherwise well, when she was reaching for some fruit and fell out of her wheelchair onto the floor. She complained of immediate and severe pain involving the left leg and was transported to the ED for further evaluation of this. She did not hit her head or lose consciousness.   ED Course: Upon arrival to the ED, patient is found to be afebrile, saturating low 90s on room air, and with vitals otherwise stable. EKG features a paced rhythm.Chemistry panel is notable for potassium of 3.4 and creatinine of 1.15, slightly up from her prior baseline. CBC is notable for a leukocytosis to 16,200 and a hemoglobin of 9.0, down from a recent prior of 9.6. Radiographs of the left femur and hip reveal a chronic fracture status post ORIF.Radiographs of the lower left leg demonstrate a moderate displaced proximal left tibia fracture, and a severely comminuted and displaced left proximal fibular head fracture. Patient was treated with Tylenol in the ED and orthopedic surgery was consulted by the ED physician. Consultant recommended a medical admission emesis, hospital where they will planned operating the morning. Patient became slightly hypoxic in the ED which normalized with administration of 2 L/m subpleural oxygen. She has otherwise remained stable and will be admitted to the medical  surgical unit [facility] for ongoing evaluation and management of left tibia/fibula fractures.  Review of Systems:  All other systems reviewed and apart from HPI, are negative.  Past Medical History:  Diagnosis Date  . Angina   . Atrial fibrillation (HCC)   . GERD (gastroesophageal reflux disease)   . Hip fracture (HCC)   . Hyperlipemia   . Hypertension   . Hypokalemia   . Hyponatremia   . Hypothyroidism   . Pacemaker   . Renal disorder    chronic kidney disorder  . Shortness of breath   . Temporal arteritis [facility])     Past Surgical History:  Procedure Laterality Date  . ABDOMINAL HYSTERECTOMY    . CATARACT EXTRACTION    . CHOLECYSTECTOMY    . INSERT / REPLACE / REMOVE PACEMAKER    . [employer]    . MV repair     at [facility]  . ORIF ACETABULAR FRACTURE     Right  . PPM       reports that she has never smoked. She has never used smokeless tobacco. She reports that she does not drink alcohol or use drugs.  Allergies  Allergen Reactions  . Sulfa Antibiotics Nausea Only    Family History  Problem Relation Age of Onset  . Breast cancer Mother      Prior to Admission medications   Medication Sig Start Date End Date Taking? Authorizing Provider  acetaminophen (TYLENOL) 325 MG tablet Take 650 mg by mouth every 6 (six) hours as needed for moderate pain.     [provider]  aspirin EC 81 MG tablet Take  81 mg by mouth every evening.     [provider]  beta carotene w/minerals (OCUVITE) tablet Take 1 tablet by mouth daily.     [provider]  bisoprolol (ZEBETA) 5 MG tablet Take 5 mg by mouth 2 (two) times daily.     [provider]  Calcium Carbonate-Vit D-Min (CALCIUM-VITAMIN D-MINERALS PO) Take 1 tablet by mouth 2 (two) times daily.    [provider]  doxycycline (VIBRAMYCIN) 100 MG capsule Take 1 capsule (100 mg total) by mouth 2 (two) times daily. 02/27/17   Standley Brooking, MD  furosemide (LASIX) 20  MG tablet Take 40 mg by mouth daily.     [provider]  guaiFENesin-dextromethorphan (ROBITUSSIN DM) 100-10 MG/5ML syrup Take 10 mLs by mouth every 4 (four) hours as needed for cough.    [provider]  levothyroxine (SYNTHROID, LEVOTHROID) 50 MCG tablet Take 50 mcg by mouth daily.    [provider]  omeprazole (PRILOSEC) 20 MG capsule Take 20 mg by mouth daily.    [provider]  simvastatin (ZOCOR) 5 MG tablet Take 5 mg by mouth at bedtime.      [provider]  traMADol (ULTRAM) 50 MG tablet Take 50 mg by mouth 2 (two) times daily.     [provider]  zoledronic acid (RECLAST) 5 MG/100ML SOLN injection Inject 5 mg into the vein every 30 (thirty) days.    [provider]    Physical Exam: Vitals:   03/01/17 1847 03/01/17 2119 03/01/17 2120  BP: (!) 132/57 (!) 107/50   Pulse: 74 85   Resp: 16 20   Temp: 98.5 F (36.9 C)    TempSrc: Oral    SpO2: 93% (!) 83% 91%      Constitutional: NAD, calm, comfortable Eyes: PERTLA, lids and conjunctivae normal ENMT: Mucous membranes are moist. Posterior pharynx clear of any exudate or lesions.   Neck: normal, supple, no masses, no thyromegaly Respiratory: clear to auscultation bilaterally, no wheezing, no crackles. Normal respiratory effort.   Cardiovascular: S1 & S2 heard, regular rate and rhythm. 1+ pedal edema bilaterally. No significant JVD. Abdomen: No distension, no tenderness, no masses palpated. Bowel sounds normal.  Musculoskeletal: no clubbing / cyanosis. Left leg immobilized in long splint, neurovascularly intact distally.   Skin: no significant rashes, lesions, ulcers. Lower right leg wrapped. Neurologic: CN 2-12 grossly intact. Sensation intact. Strength 5/5 in all 4 limbs.  Psychiatric: Somnolent, easily roused. Calm, cooperative.     Labs on Admission: I have personally reviewed following labs and imaging studies  CBC:  Recent Labs Lab 02/25/17 1201  02/25/17 1914 03/01/17 2009  WBC 3.2* 9.1 16.2*  NEUTROABS 2.6  --   --   HGB 17.9* 9.6* 9.0*  HCT 54.3* 29.9* 27.0*  MCV 86.2 86.7 86.5  PLT 126* 257 306   Basic Metabolic Panel:  Recent Labs Lab 02/25/17 1201 02/25/17 1615 03/01/17 2009  NA 136  --  138  K 3.9  --  3.4*  CL 103  --  101  CO2 24  --  24  GLUCOSE 92  --  124*  BUN 23*  --  26*  CREATININE 1.00 1.03* 1.15*  CALCIUM 8.5*  --  8.5*   GFR: Estimated Creatinine Clearance: 25.6 mL/min (A) (by C-G formula based on SCr of 1.15 mg/dL (H)). Liver Function Tests: No results for input(s): AST, ALT, ALKPHOS, BILITOT, PROT, ALBUMIN in the last 168 hours. No results for input(s): LIPASE,  AMYLASE in the last 168 hours. No results for input(s): AMMONIA in the last 168 hours. Coagulation Profile: No results for input(s): INR, PROTIME in the last 168 hours. Cardiac Enzymes: No results for input(s): CKTOTAL, CKMB, CKMBINDEX, TROPONINI in the last 168 hours. BNP (last 3 results) No results for input(s): PROBNP in the last 8760 hours. HbA1C: No results for input(s): HGBA1C in the last 72 hours. CBG: No results for input(s): GLUCAP in the last 168 hours. Lipid Profile: No results for input(s): CHOL, HDL, LDLCALC, TRIG, CHOLHDL, LDLDIRECT in the last 72 hours. Thyroid Function Tests: No results for input(s): TSH, T4TOTAL, FREET4, T3FREE, THYROIDAB in the last 72 hours. Anemia Panel: No results for input(s): VITAMINB12, FOLATE, FERRITIN, TIBC, IRON, RETICCTPCT in the last 72 hours. Urine analysis:    Component Value Date/Time   COLORURINE YELLOW 06/30/2016 0935   APPEARANCEUR HAZY (A) 06/30/2016 0935   LABSPEC 1.019 06/30/2016 0935   PHURINE 5.0 06/30/2016 0935   GLUCOSEU NEGATIVE 06/30/2016 0935   HGBUR NEGATIVE 06/30/2016 0935   BILIRUBINUR NEGATIVE 06/30/2016 0935   KETONESUR NEGATIVE 06/30/2016 0935   PROTEINUR NEGATIVE 06/30/2016 0935   UROBILINOGEN 1.0 10/13/2012 0458   NITRITE NEGATIVE 06/30/2016 0935    LEUKOCYTESUR SMALL (A) 06/30/2016 0935   Sepsis Labs: (procalcitonin:4,lacticidven:4) )No results found for this or any previous visit (from the past 240 hour(s)).   Radiological Exams on Admission: Dg Knee 2 Views Left  Result Date: 03/01/2017 CLINICAL DATA:  Left knee pain after fall at nursing facility. EXAM: LEFT KNEE - 1-2 VIEW COMPARISON:  None. FINDINGS: Moderately displaced fracture is seen involving the proximal left tibia. Severely displaced and comminuted fracture is seen involving the left proximal fibular head. Status post intramedullary rod fixation of visualized femur. No definite joint effusion is noted. IMPRESSION: Moderately displaced proximal left tibial fracture. Severely comminuted and displaced left proximal fibular head fracture. Electronically Signed   By: Lupita Raider, M.D.   On: 03/01/2017 20:13   Dg Tibia/fibula Left  Result Date: 03/01/2017 CLINICAL DATA:  Left lower leg pain after fall at nursing facility. EXAM: LEFT TIBIA AND FIBULA - 2 VIEW COMPARISON:  None. FINDINGS: Moderately displaced oblique fracture seen involving the proximal left tibia. Severely displaced proximal left fibular fracture is noted. Soft tissues are unremarkable. IMPRESSION: Moderately displaced proximal left tibial fracture, with severely displaced proximal left fibular fracture. Electronically Signed   By: Lupita Raider, M.D.   On: 03/01/2017 20:13   Dg Hip Unilat With Pelvis 2-3 Views Left  Result Date: 03/01/2017 CLINICAL DATA:  Fall. EXAM: DG HIP (WITH OR WITHOUT PELVIS) 2-3V LEFT COMPARISON:  01/07/2017. FINDINGS: Previous ORIF of the proximal right femur. Chronically fractured right hip with superior displacement of the femoral neck. Prior ORIF of the left hip. No acute fracture or dislocation identified. IMPRESSION: 1. Chronic fracture and dislocation of the right hip 2. Previous ORIF of left hip.  No acute fractures identified Electronically Signed   By: Signa Kell M.D.    On: 03/01/2017 20:13    EKG: Independently reviewed. Ventricular-paced rhythm.   Assessment/Plan  1. Left tibia and fibula fractures - Pt presents with severe left leg pain after a fall from her wheelchair  - Radiographs reveal fractures of proximal left tibia and fibula  - Orthopedic surgery is consulting and much appreciated; planning for surgery 10/10 at [facility]  - Based on the available data, Ms. Fong presents an estimated 2% risk probability for perioperative MI or cardiac arrest per Nolon Nations  al - NPO after midnight, continue supportive care with prn analgesia, gentle IVF hydration   2. Atrial fibrillation  - Rate-controlled, CHADS-VASc is at least 3 (age x2, gender, HTN)  - Not anticoagulated  - Continue bisoprolol as tolerated    3. Anemia  - Hgb is 9.0 on admission, down from 9.6 on 10/5  - No bleeding evident  - Type and screen, repeat CBC in am    4. Hypertension  - BP at goal  - Continue bisoprolol as tolerated   5. RLE cellulitis  - No fevers  - Continue doxycycline to complete course    6. Hypothyroidism  - Continue Synthroid    DVT prophylaxis: SCD Code Status: Full for surgery  Family Communication: Discussed with patient Disposition Plan: Admit to med[facility]urg at [facility] Consults called: Orthopedic surgery Admission status: Inpatient    Briscoe Deutscher, MD [facility] Pager [phone]  If 7PM-7AM, please contact night-coverage [email or web address] Password [email or web address]  03/01/2017, 9:59 PM

## 2017-03-01 NOTE — Consult Note (Signed)
Orthopaedic Trauma Service (OTS) Consult   Patient ID: Janice [name] MRN: [id number] DOB/AGE: 1921/08/26 83 y.o.  Reason for Consult:Left tibia fracture Referring Physician: Odie Sera, MD  HPI: Janice [name] is an 83 y.o. female who is being seen in consultation at the request of Dr. Antionette Char for evaluation of left tibia fracture. The patient was in her wheelchair when she reached forward to reach fruit and fell forward. She developed pain and deformity to her left knee. She was brought to the ED and x-rays were obtained which showed a proximal tibia fracture. Dr. Charlann Boxer was on call for orthopaedics and he asked that I assume her care.  She is currently complaining of pain in knee. She is arousable and answers questions appropriately. According to the patient she walks minimally with walker but her niece says that she does not walk at all and is wheelchair bound. She currently is not having much pain if she does not move the knee. She denies any pain in any other extremities.  Past Medical History:  Diagnosis Date  . Angina   . Atrial fibrillation (HCC)   . GERD (gastroesophageal reflux disease)   . Hip fracture (HCC)   . Hyperlipemia   . Hypertension   . Hypokalemia   . Hyponatremia   . Hypothyroidism   . Pacemaker   . Renal disorder    chronic kidney disorder  . Shortness of breath   . Temporal arteritis [facility])     Past Surgical History:  Procedure Laterality Date  . ABDOMINAL HYSTERECTOMY    . CATARACT EXTRACTION    . CHOLECYSTECTOMY    . INSERT / REPLACE / REMOVE PACEMAKER    . [employer]    . MV repair     at [facility]  . ORIF ACETABULAR FRACTURE     Right  . PPM      Family History  Problem Relation Age of Onset  . Breast cancer Mother     Social History:  reports that she has never smoked. She has never used smokeless tobacco. She reports that she does not drink alcohol or use drugs.  Allergies:  Allergies  Allergen Reactions  . Sulfa Antibiotics Nausea  Only    Medications: I have reviewed the patient's current medications.  ROS: Constitutional: No fever or chills Vision: No changes in vision ENT: No difficulty swallowing CV: No chest pain Pulm: No SOB or wheezing GI: No nausea or vomiting GU: No urgency or inability to hold urine Skin: No poor wound healing Neurologic: No numbness or tingling Psychiatric: No depression or anxiety Heme: No bruising Allergic: No reaction to medications or food   Exam: Blood pressure (!) 107/50, pulse 85, temperature 98.5 F (36.9 C), temperature source Oral, resp. rate 20, SpO2 91 %. General:No acute distress Orientation:Awake and alert to situation Mood and Affect: Cooperative and pleasant Gait: Unable to assess due to fracture Coordination and balance: Upper extremity coordination normal, balance unable to be assessed due to fracture.  Left lower Extremity: Knee immobilizer in place, compartments soft and compressible. Unable to move leg due to pain. Intact dorsiflexion and plantarflexion. Full sensory exam unable to be performed. Warm and well perfused foot. Some pitting edema but no lymphadenopathy. Downgoing babinski.  Bilateral upper extremities and right lower extremity: Skin without lesions. No tenderness to palpation. Full painless ROM, full strength in each muscle groups without evidence of instability.   Medical Decision Making: Imaging: X-rays of left tibia/knee show a proximal tibia fracture with  significant valgus malalignment, shortening and translation. Does not appear to involve the joint.  Labs: Hgb 9  Medical history and chart was reviewed  Assessment/Plan: 83 year old female with history of A fib and HTN with left proximal tibia fracture  -Recommend surgical fixation with ORIF -Discussed with her niece risks and benefits of surgical fixation. I feel that trying to treat this fracture nonoperatively would be difficult as she would need at least 3 months in a cast vs  brace. This would increase the risk of skin breakdown and potentially still have poor alignment and increased risk of nonunion. In light of this her niece agreed to proceed with surgery -Risks discussed included bleeding requiring blood transfusion, bleeding causing a hematoma, infection, malunion, nonunion, damage to surrounding nerves and blood vessels, pain, hardware prominence or irritation, hardware failure, pain, stiffness, post-traumatic arthritis, DVT/PE, compartment syndrome, and even death.   Roby Lofts, MD [facility] [phone] (phone)

## 2017-03-01 NOTE — ED Notes (Signed)
Bed: [facility] Expected date:  Expected time:  Means of arrival:  Comments: Ems-FALL

## 2017-03-01 NOTE — ED Triage Notes (Signed)
Patient is from [place] at [facility] and transported via [facility]. Per EMS, patient had a fall from her wheelchair. EMS found patient lying on the floor when they arrived. Patient is complaining of left hip and leg pain. Patient is alert and appears in no acute distress.

## 2017-03-01 NOTE — ED Notes (Signed)
External urinary catheter has not produced a urine specimen yet.

## 2017-03-01 NOTE — ED Provider Notes (Signed)
[facility]-EMERGENCY DEPT Provider Note   CSN: [id number] Arrival date & time: 03/01/17  1840     History   Chief Complaint Chief Complaint  Patient presents with  . Fall    HPI Janice [name] is a 83 y.o. female.  HPI  83 y.o. female with a hx of Atrial Fibrillation, HTN, HLD, CKD, presents to the Emergency Department today via EMS from [place] at [facility] due to fall from wheelchair. Pt states that she was in her wheelchair and attempted to lean forward and reach for some fruit when she fell forward. Denies head trauma or LOC. No N/V. No numbness/tingling. No headache. No CP/SOB/ABD pain. Pt does complain of left hip pain that she rates 6/10. Also endorses left knee pain. Described as throbbing. No meds PTA. No blood thinner usage. No other symptoms noted.   Past Medical History:  Diagnosis Date  . Angina   . Atrial fibrillation (HCC)   . GERD (gastroesophageal reflux disease)   . Hip fracture (HCC)   . Hyperlipemia   . Hypertension   . Hypokalemia   . Hyponatremia   . Hypothyroidism   . Pacemaker   . Renal disorder    chronic kidney disorder  . Shortness of breath   . Temporal arteritis [facility])     Patient Active Problem List   Diagnosis Date Noted  . Cellulitis 02/27/2017  . Pressure injury of skin 02/26/2017  . Cellulitis of right leg 02/26/2017  . Benign essential HTN 02/26/2017  . Hypothyroidism 04/19/2012  . Temporal arteritis (HCC) 04/19/2012  . Protein-calorie malnutrition, severe (HCC) 04/19/2012  . Pacemaker 02/08/2012  . Normocytic anemia 12/29/2011  . Atrial fibrillation (HCC) 12/23/2009  . GERD 12/23/2009    Past Surgical History:  Procedure Laterality Date  . ABDOMINAL HYSTERECTOMY    . CATARACT EXTRACTION    . CHOLECYSTECTOMY    . INSERT / REPLACE / REMOVE PACEMAKER    . [employer]    . MV repair     at [facility]  . ORIF ACETABULAR FRACTURE     Right  . PPM      OB History    No data available       Home Medications     Prior to Admission medications   Medication Sig Start Date End Date Taking? Authorizing Provider  acetaminophen (TYLENOL) 325 MG tablet Take 650 mg by mouth every 6 (six) hours as needed for moderate pain.     [provider]  aspirin EC 81 MG tablet Take 81 mg by mouth every evening.     [provider]  beta carotene w/minerals (OCUVITE) tablet Take 1 tablet by mouth daily.     [provider]  bisoprolol (ZEBETA) 5 MG tablet Take 5 mg by mouth 2 (two) times daily.     [provider]  Calcium Carbonate-Vit D-Min (CALCIUM-VITAMIN D-MINERALS PO) Take 1 tablet by mouth 2 (two) times daily.    [provider]  doxycycline (VIBRAMYCIN) 100 MG capsule Take 1 capsule (100 mg total) by mouth 2 (two) times daily. 02/27/17   Standley Brooking, MD  furosemide (LASIX) 20 MG tablet Take 40 mg by mouth daily.     [provider]  guaiFENesin-dextromethorphan (ROBITUSSIN DM) 100-10 MG/5ML syrup Take 10 mLs by mouth every 4 (four) hours as needed for cough.    [provider]  levothyroxine (SYNTHROID, LEVOTHROID) 50 MCG tablet Take 50 mcg by mouth daily.    [provider]  omeprazole (PRILOSEC) 20 MG capsule Take 20 mg by mouth daily.    [provider]  simvastatin (ZOCOR) 5 MG tablet Take 5 mg by mouth at bedtime.      [provider]  traMADol (ULTRAM) 50 MG tablet Take 50 mg by mouth 2 (two) times daily.     [provider]  zoledronic acid (RECLAST) 5 MG/100ML SOLN injection Inject 5 mg into the vein every 30 (thirty) days.    [provider]    Family History Family History  Problem Relation Age of Onset  . Breast cancer Mother     Social History Social History  Substance Use Topics  . Smoking status: Never Smoker  . Smokeless tobacco: Never Used  . Alcohol use No     Allergies   Sulfa antibiotics   Review of Systems Review of Systems ROS reviewed and all are negative for  acute change except as noted in the HPI.  Physical Exam Updated Vital Signs BP (!) 132/57 (BP Location: Left Arm)   Pulse 74   Temp 98.5 F (36.9 C) (Oral)   Resp 16   SpO2 93%   Physical Exam  Constitutional: She is oriented to person, place, and time. She appears well-developed and well-nourished. No distress.  HENT:  Head: Normocephalic and atraumatic.  Right Ear: Tympanic membrane, external ear and ear canal normal.  Left Ear: Tympanic membrane, external ear and ear canal normal.  Nose: Nose normal.  Mouth/Throat: Uvula is midline, oropharynx is clear and moist and mucous membranes are normal. No trismus in the jaw. No oropharyngeal exudate, posterior oropharyngeal erythema or tonsillar abscesses.  Eyes: Pupils are equal, round, and reactive to light. EOM are normal.  Neck: Normal range of motion. Neck supple. No tracheal deviation present.  Cardiovascular: Normal rate, regular rhythm, S1 normal, S2 normal, normal heart sounds, intact distal pulses and normal pulses.   Pulmonary/Chest: Effort normal and breath sounds normal. No respiratory distress. She has no decreased breath sounds. She has no wheezes. She has no rhonchi. She has no rales.  Abdominal: Normal appearance and bowel sounds are normal. There is no tenderness.  Musculoskeletal: Normal range of motion.  Left Hip TTP. Leg shortening noted, but could be more positioning. Pain with ROM in external/internal rotation. Pain with flexion knee. Obvious swelling and ecchymosis with deformity noted distal to knee. NVI. Distal pulses appreciated. Compartments soft  Neurological: She is alert and oriented to person, place, and time.  Skin: Skin is warm and dry.  Psychiatric: She has a normal mood and affect. Her speech is normal and behavior is normal. Thought content normal.  Nursing note and vitals reviewed.    ED Treatments / Results  Labs (all labs ordered are listed, but only abnormal results are displayed) Labs Reviewed    CBC - Abnormal; Notable for the following:       Result Value   WBC 16.2 (*)    RBC 3.12 (*)    Hemoglobin 9.0 (*)    HCT 27.0 (*)    All other components within normal limits  BASIC METABOLIC PANEL - Abnormal; Notable for the following:    Potassium 3.4 (*)    Glucose, Bld 124 (*)    BUN 26 (*)    Creatinine, Ser 1.15 (*)    Calcium 8.5 (*)    GFR calc non Af Amer 40 (*)    GFR calc Af Amer 46 (*)    All other components within normal limits  EKG  EKG Interpretation None       Radiology Dg Knee 2 Views Left  Result Date: 03/01/2017 CLINICAL DATA:  Left knee pain after fall at nursing facility. EXAM: LEFT KNEE - 1-2 VIEW COMPARISON:  None. FINDINGS: Moderately displaced fracture is seen involving the proximal left tibia. Severely displaced and comminuted fracture is seen involving the left proximal fibular head. Status post intramedullary rod fixation of visualized femur. No definite joint effusion is noted. IMPRESSION: Moderately displaced proximal left tibial fracture. Severely comminuted and displaced left proximal fibular head fracture. Electronically Signed   By: Lupita Raider, M.D.   On: 03/01/2017 20:13   Dg Tibia/fibula Left  Result Date: 03/01/2017 CLINICAL DATA:  Left lower leg pain after fall at nursing facility. EXAM: LEFT TIBIA AND FIBULA - 2 VIEW COMPARISON:  None. FINDINGS: Moderately displaced oblique fracture seen involving the proximal left tibia. Severely displaced proximal left fibular fracture is noted. Soft tissues are unremarkable. IMPRESSION: Moderately displaced proximal left tibial fracture, with severely displaced proximal left fibular fracture. Electronically Signed   By: Lupita Raider, M.D.   On: 03/01/2017 20:13   Dg Hip Unilat With Pelvis 2-3 Views Left  Result Date: 03/01/2017 CLINICAL DATA:  Fall. EXAM: DG HIP (WITH OR WITHOUT PELVIS) 2-3V LEFT COMPARISON:  01/07/2017. FINDINGS: Previous ORIF of the proximal right femur. Chronically  fractured right hip with superior displacement of the femoral neck. Prior ORIF of the left hip. No acute fracture or dislocation identified. IMPRESSION: 1. Chronic fracture and dislocation of the right hip 2. Previous ORIF of left hip.  No acute fractures identified Electronically Signed   By: Signa Kell M.D.   On: 03/01/2017 20:13    Procedures Procedures (including critical care time)  Medications Ordered in ED Medications  acetaminophen (TYLENOL) tablet 650 mg (not administered)     Initial Impression / Assessment and Plan / ED Course  I have reviewed the triage vital signs and the nursing notes.  Pertinent labs & imaging results that were available during my care of the patient were reviewed by me and considered in my medical decision making (see chart for details).  Final Clinical Impressions(s) / ED Diagnoses  {I have reviewed and evaluated the relevant laboratory values. {I have reviewed and evaluated the relevant imaging studies.  {I have reviewed the relevant previous healthcare records.  {I obtained HPI from historian. {Patient discussed with supervising physician.  ED Course:  Assessment: Pt is a 83 y.o. female with a hx of Atrial Fibrillation, HTN, HLD, CKD, presents to the Emergency Department today via EMS from [place] at [facility] due to fall from wheelchair. Pt states that she was in her wheelchair and attempted to lean forward and reach for some fruit when she fell forward. Denies head trauma or LOC. No N/V. No numbness/tingling. No headache. No CP/SOB/ABD pain. Pt does complain of left hip pain that she rates 6/10. Also endorses left knee pain. Described as throbbing. No meds PTA. No blood thinner usage. On exam, pt in NAD. Nontoxic/nonseptic appearing. VSS. Afebrile. Lungs CTA. Heart RRR. Abdomen nontender soft. Left Hip TTP. Leg shortening noted, but could be more positioning. Pain with ROM in external/internal rotation. NVI. Distal pulses appreciated.  Labs  pending. DG left Hip, tib, knee shows proximal tibial fracture with severely displaced fib fracture. Consult to Orthopedics (Dr. Charlann Boxer). Splint in ED. Knee Immobilizer. Admit to medicine with transfer to [facility]. Surgery in AM.   Disposition/Plan:  Admit Pt acknowledges and agrees with  plan  Supervising Physician Lorre Nick, MD  Final diagnoses:  Other closed fracture of proximal end of left tibia, initial encounter  Other closed fracture of proximal end of left fibula, initial encounter    New Prescriptions New Prescriptions   No medications on file     Audry Pili, Cordelia Poche 03/01/17 2130    Lorre Nick, MD 03/01/17 2333

## 2017-03-01 NOTE — ED Notes (Signed)
Patient placed on purewick catheter. Waiting for urine specimen.

## 2017-03-02 ENCOUNTER — Encounter (HOSPITAL_COMMUNITY): Payer: Self-pay

## 2017-03-02 ENCOUNTER — Inpatient Hospital Stay (HOSPITAL_COMMUNITY): Payer: Medicare Other | Admitting: Anesthesiology

## 2017-03-02 ENCOUNTER — Encounter (HOSPITAL_COMMUNITY)
Admission: EM | Disposition: A | Discharge status: Skilled Nursing Facility | Payer: Self-pay | Source: Home / Self Care | Attending: Internal Medicine

## 2017-03-02 ENCOUNTER — Inpatient Hospital Stay (HOSPITAL_COMMUNITY): Payer: Medicare Other

## 2017-03-02 DIAGNOSIS — W19XXXA Unspecified fall, initial encounter: Secondary | ICD-10-CM

## 2017-03-02 HISTORY — PX: ORIF TIBIA PLATEAU: SHX2132

## 2017-03-02 LAB — SURGICAL PCR SCREEN
MRSA, PCR: NEGATIVE
Staphylococcus aureus: POSITIVE — AB

## 2017-03-02 LAB — MRSA PCR SCREENING: MRSA BY PCR: NEGATIVE

## 2017-03-02 LAB — TYPE AND SCREEN
ABO/RH(D): A POS
Antibody Screen: POSITIVE
DAT, IgG: NEGATIVE

## 2017-03-02 SURGERY — OPEN REDUCTION INTERNAL FIXATION (ORIF) TIBIAL PLATEAU
Anesthesia: Spinal | Site: Leg Lower | Laterality: Left

## 2017-03-02 MED ORDER — PHENYLEPHRINE HCL 10 MG/ML IJ SOLN
INTRAMUSCULAR | Status: DC | PRN
  Administered 2017-03-02: 80 ug via INTRAVENOUS

## 2017-03-02 MED ORDER — FENTANYL CITRATE (PF) 250 MCG/5ML IJ SOLN
INTRAMUSCULAR | Status: AC
  Filled 2017-03-02: qty 5

## 2017-03-02 MED ORDER — OXYCODONE HCL 5 MG/5ML PO SOLN: 5.0000 mg | Freq: Once | ORAL | Status: DC | PRN

## 2017-03-02 MED ORDER — OXYCODONE HCL 5 MG PO TABS: 5.0000 mg | ORAL_TABLET | Freq: Once | ORAL | Status: DC | PRN

## 2017-03-02 MED ORDER — PHENYLEPHRINE 40 MCG/ML (10ML) SYRINGE FOR IV PUSH (FOR BLOOD PRESSURE SUPPORT)
PREFILLED_SYRINGE | INTRAVENOUS | Status: AC
  Filled 2017-03-02: qty 10

## 2017-03-02 MED ORDER — PROPOFOL 10 MG/ML IV BOLUS
INTRAVENOUS | Status: AC
  Filled 2017-03-02: qty 20

## 2017-03-02 MED ORDER — CEFAZOLIN SODIUM-DEXTROSE 2-4 GM/100ML-% IV SOLN
2.0000 g | Freq: Three times a day (TID) | INTRAVENOUS | Status: DC
  Filled 2017-03-02 (×2): qty 100

## 2017-03-02 MED ORDER — ONDANSETRON HCL 4 MG/2ML IJ SOLN: 4.0000 mg | Freq: Once | INTRAMUSCULAR | Status: DC | PRN

## 2017-03-02 MED ORDER — VANCOMYCIN HCL 500 MG IV SOLR
INTRAVENOUS | Status: AC
  Filled 2017-03-02: qty 500

## 2017-03-02 MED ORDER — PROPOFOL 1000 MG/100ML IV EMUL
INTRAVENOUS | Status: AC
  Filled 2017-03-02: qty 100

## 2017-03-02 MED ORDER — BACITRACIN ZINC 500 UNIT/GM EX OINT
TOPICAL_OINTMENT | CUTANEOUS | Status: AC
  Filled 2017-03-02: qty 28.35

## 2017-03-02 MED ORDER — LACTATED RINGERS IV SOLN
INTRAVENOUS | Status: DC
  Administered 2017-03-02: 11:00:00 via INTRAVENOUS

## 2017-03-02 MED ORDER — CEFAZOLIN SODIUM-DEXTROSE 1-4 GM/50ML-% IV SOLN
1.0000 g | Freq: Two times a day (BID) | INTRAVENOUS | Status: AC
  Administered 2017-03-02 – 2017-03-03 (×2): 1 g via INTRAVENOUS
  Filled 2017-03-02 (×4): qty 50

## 2017-03-02 MED ORDER — MORPHINE SULFATE (PF) 4 MG/ML IV SOLN
0.5000 mg | INTRAVENOUS | Status: DC | PRN
  Administered 2017-03-02 – 2017-03-03 (×4): 1 mg via INTRAVENOUS
  Filled 2017-03-02 (×4): qty 1

## 2017-03-02 MED ORDER — LACTATED RINGERS IV SOLN
INTRAVENOUS | Status: DC | PRN
  Administered 2017-03-02: 12:00:00 via INTRAVENOUS

## 2017-03-02 MED ORDER — LIDOCAINE 2% (20 MG/ML) 5 ML SYRINGE
INTRAMUSCULAR | Status: AC
  Filled 2017-03-02: qty 5

## 2017-03-02 MED ORDER — CEFAZOLIN SODIUM-DEXTROSE 2-4 GM/100ML-% IV SOLN
2.0000 g | INTRAVENOUS | Status: AC
  Administered 2017-03-02: 2 g via INTRAVENOUS
  Filled 2017-03-02: qty 100

## 2017-03-02 MED ORDER — DEXTROSE 5 % IV SOLN
INTRAVENOUS | Status: DC | PRN
  Administered 2017-03-02: 50 ug/min via INTRAVENOUS

## 2017-03-02 MED ORDER — VANCOMYCIN HCL 1000 MG IV SOLR
INTRAVENOUS | Status: AC
  Filled 2017-03-02: qty 1000

## 2017-03-02 MED ORDER — PROPOFOL 10 MG/ML IV BOLUS
INTRAVENOUS | Status: DC | PRN
  Administered 2017-03-02 (×5): 10 mg via INTRAVENOUS

## 2017-03-02 MED ORDER — LIDOCAINE HCL (CARDIAC) 20 MG/ML IV SOLN
INTRAVENOUS | Status: DC | PRN
  Administered 2017-03-02: 20 mg via INTRAVENOUS

## 2017-03-02 MED ORDER — FENTANYL CITRATE (PF) 100 MCG/2ML IJ SOLN: 25.0000 ug | INTRAMUSCULAR | Status: DC | PRN

## 2017-03-02 MED ORDER — BUPIVACAINE IN DEXTROSE 0.75-8.25 % IT SOLN
INTRATHECAL | Status: DC | PRN
  Administered 2017-03-02: 2 mL via INTRATHECAL

## 2017-03-02 MED ORDER — 0.9 % SODIUM CHLORIDE (POUR BTL) OPTIME
TOPICAL | Status: DC | PRN
Start: 2017-03-02 — End: 2017-03-02
  Administered 2017-03-02: 1000 mL

## 2017-03-02 MED ORDER — VANCOMYCIN HCL 1000 MG IV SOLR
INTRAVENOUS | Status: DC | PRN
  Administered 2017-03-02: 1000 mg

## 2017-03-02 SURGICAL SUPPLY — 74 items
BANDAGE ACE 4X5 VEL STRL LF (GAUZE/BANDAGES/DRESSINGS) ×3 IMPLANT
BANDAGE ACE 6X5 VEL STRL LF (GAUZE/BANDAGES/DRESSINGS) ×3 IMPLANT
BANDAGE ESMARK 6X9 LF (GAUZE/BANDAGES/DRESSINGS) ×1 IMPLANT
BIT DRILL 2.5 X LONG (BIT) ×1
BIT DRILL PERC QC 2.8X200 100 (BIT) IMPLANT
BIT DRILL X LONG 2.5 (BIT) IMPLANT
BLADE SURG 15 STRL LF DISP TIS (BLADE) ×1 IMPLANT
BLADE SURG 15 STRL SS (BLADE) ×3
BNDG CMPR 9X6 STRL LF SNTH (GAUZE/BANDAGES/DRESSINGS)
BNDG ESMARK 6X9 LF (GAUZE/BANDAGES/DRESSINGS)
BNDG GAUZE ELAST 4 BULKY (GAUZE/BANDAGES/DRESSINGS) ×1 IMPLANT
CANISTER SUCT 3000ML PPV (MISCELLANEOUS) ×3 IMPLANT
CHLORAPREP W/TINT 26ML (MISCELLANEOUS) ×5 IMPLANT
COVER SURGICAL LIGHT HANDLE (MISCELLANEOUS) ×3 IMPLANT
DRAPE C-ARM 42X72 X-RAY (DRAPES) ×3 IMPLANT
DRAPE C-ARMOR (DRAPES) ×3 IMPLANT
DRAPE ORTHO SPLIT 77X108 STRL (DRAPES) ×6
DRAPE SURG ORHT 6 SPLT 77X108 (DRAPES) ×2 IMPLANT
DRAPE U-SHAPE 47X51 STRL (DRAPES) ×3 IMPLANT
DRILL BIT QUICK COUP 2.8MM 100 (BIT) ×2
DRILL BIT X LONG 2.5 (BIT) ×3
DRSG ADAPTIC 3X8 NADH LF (GAUZE/BANDAGES/DRESSINGS) ×2 IMPLANT
DRSG MEPILEX BORDER 4X12 (GAUZE/BANDAGES/DRESSINGS) IMPLANT
DRSG MEPITEL 4X7.2 (GAUZE/BANDAGES/DRESSINGS) ×2 IMPLANT
DRSG PAD ABDOMINAL 8X10 ST (GAUZE/BANDAGES/DRESSINGS) ×6 IMPLANT
ELECT REM PT RETURN 9FT ADLT (ELECTROSURGICAL) ×3
ELECTRODE REM PT RTRN 9FT ADLT (ELECTROSURGICAL) ×1 IMPLANT
GAUZE SPONGE 4X4 12PLY STRL (GAUZE/BANDAGES/DRESSINGS) ×6 IMPLANT
GLOVE BIO SURGEON STRL SZ 6.5 (GLOVE) ×2 IMPLANT
GLOVE BIO SURGEON STRL SZ7.5 (GLOVE) ×6 IMPLANT
GLOVE BIO SURGEONS STRL SZ 6.5 (GLOVE) ×1
GLOVE BIOGEL PI IND STRL 6 (GLOVE) IMPLANT
GLOVE BIOGEL PI IND STRL 7.0 (GLOVE) IMPLANT
GLOVE BIOGEL PI IND STRL 7.5 (GLOVE) ×1 IMPLANT
GLOVE BIOGEL PI INDICATOR 6 (GLOVE) ×4
GLOVE BIOGEL PI INDICATOR 7.0 (GLOVE) ×4
GLOVE BIOGEL PI INDICATOR 7.5 (GLOVE) ×4
GLOVE INDICATOR 6.5 STRL GRN (GLOVE) ×6 IMPLANT
GOWN STRL REUS W/ TWL LRG LVL3 (GOWN DISPOSABLE) ×2 IMPLANT
GOWN STRL REUS W/TWL LRG LVL3 (GOWN DISPOSABLE) ×12
IMMOBILIZER KNEE 22 UNIV (SOFTGOODS) ×3 IMPLANT
KIT BASIN OR (CUSTOM PROCEDURE TRAY) ×3 IMPLANT
KIT ROOM TURNOVER OR (KITS) ×3 IMPLANT
NDL SUT 6 .5 CRC .975X.05 MAYO (NEEDLE) IMPLANT
NEEDLE MAYO TAPER (NEEDLE)
NS IRRIG 1000ML POUR BTL (IV SOLUTION) ×3 IMPLANT
PACK TOTAL JOINT (CUSTOM PROCEDURE TRAY) ×3 IMPLANT
PAD ARMBOARD 7.5X6 YLW CONV (MISCELLANEOUS) ×6 IMPLANT
PAD CAST 4YDX4 CTTN HI CHSV (CAST SUPPLIES) ×1 IMPLANT
PADDING CAST COTTON 4X4 STRL (CAST SUPPLIES)
PADDING CAST COTTON 6X4 STRL (CAST SUPPLIES) ×1 IMPLANT
PLATE TIBIA LCP 12H LEFT 185MM (Plate) ×2 IMPLANT
SCREW CORTEX 3.5 40MM (Screw) ×2 IMPLANT
SCREW LOCK CORT ST 3.5X40 (Screw) IMPLANT
SCREW LOCK T15 FT 30X3.5X2.9X (Screw) IMPLANT
SCREW LOCKING 3.5X30 (Screw) ×3 IMPLANT
SCREW LOCKING 3.5X34 (Screw) ×2 IMPLANT
SCREW LOCKING SLF TAP 3.5X75MM (Screw) ×2 IMPLANT
SCREW LOCKING SLF TAP 3.5X80MM (Screw) ×9 IMPLANT
SCREW LOCKING SLF TAP 3.5X85MM (Screw) ×2 IMPLANT
STAPLER VISISTAT 35W (STAPLE) ×3 IMPLANT
SUCTION FRAZIER HANDLE 10FR (MISCELLANEOUS) ×2
SUCTION TUBE FRAZIER 10FR DISP (MISCELLANEOUS) ×1 IMPLANT
SUT ETHILON 2 0 FS 18 (SUTURE) ×3 IMPLANT
SUT ETHILON 3 0 PS 1 (SUTURE) ×6 IMPLANT
SUT MNCRL AB 3-0 PS2 18 (SUTURE) ×3 IMPLANT
SUT PROLENE 0 CT (SUTURE) IMPLANT
SUT VIC AB 0 CT1 18XCR BRD 8 (SUTURE) IMPLANT
SUT VIC AB 0 CT1 27 (SUTURE) ×3
SUT VIC AB 0 CT1 27XBRD ANBCTR (SUTURE) ×1 IMPLANT
SUT VIC AB 0 CT1 8-18 (SUTURE) ×3
SUT VIC AB 2-0 CT1 27 (SUTURE) ×6
SUT VIC AB 2-0 CT1 TAPERPNT 27 (SUTURE) ×2 IMPLANT
TOWEL OR 17X26 10 PK STRL BLUE (TOWEL DISPOSABLE) ×6 IMPLANT

## 2017-03-02 NOTE — Progress Notes (Signed)
PHARMACY NOTE:  ANTIMICROBIAL RENAL DOSAGE ADJUSTMENT  Current antimicrobial regimen includes a mismatch between antimicrobial dosage and estimated renal function.  As per policy approved by the Pharmacy & Therapeutics and Medical Executive Committees, the antimicrobial dosage will be adjusted accordingly.  Current antimicrobial dosage:  Ancef 2gm IV q8h  Indication: surgical prophylaxis  Renal Function:  Estimated Creatinine Clearance: 24.8 mL/min (A) (by C-G formula based on SCr of 1.15 mg/dL (H)).     Antimicrobial dosage has been changed to:  Ancef 1gm IV q12h x2 doses  Additional comments:   Thank you for allowing pharmacy to be a part of this patient's care.  Harland German, Pharm D 03/02/2017 9:02 PM

## 2017-03-02 NOTE — Anesthesia Preprocedure Evaluation (Deleted)
Anesthesia Evaluation  Patient identified by MRN, date of birth, ID band Patient awake    Reviewed: Allergy & Precautions, NPO status , Patient's Chart, lab work & pertinent test results, reviewed documented beta blocker date and time   Airway Mallampati: II  TM Distance: >3 FB Neck ROM: Full    Dental  (+) Dental Advisory Given   Pulmonary neg pulmonary ROS,    Pulmonary exam normal breath sounds clear to auscultation       Cardiovascular hypertension, + Peripheral Vascular Disease  Normal cardiovascular exam+ dysrhythmias Atrial Fibrillation + pacemaker  Rhythm:Regular Rate:Normal     Neuro/Psych Temporal arteritis negative psych ROS   GI/Hepatic Neg liver ROS, GERD  ,  Endo/Other  Hypothyroidism   Renal/GU Renal InsufficiencyRenal disease  negative genitourinary   Musculoskeletal negative musculoskeletal ROS (+)   Abdominal   Peds  Hematology  (+) anemia ,   Anesthesia Other Findings   Reproductive/Obstetrics                             Anesthesia Physical Anesthesia Plan  ASA: III  Anesthesia Plan: General   Post-op Pain Management:    Induction: Intravenous  PONV Risk Score and Plan: 4 or greater and Ondansetron, Propofol infusion and Treatment may vary due to age or medical condition  Airway Management Planned: LMA  Additional Equipment: None  Intra-op Plan:   Post-operative Plan: Extubation in OR  Informed Consent: I have reviewed the patients History and Physical, chart, labs and discussed the procedure including the risks, benefits and alternatives for the proposed anesthesia with the patient or authorized representative who has indicated his/her understanding and acceptance.   Dental advisory given  Plan Discussed with: CRNA  Anesthesia Plan Comments:         Anesthesia Quick Evaluation

## 2017-03-02 NOTE — ED Notes (Signed)
No urine return from purewick catheter. Checked patient and she has not had an incontinent episode.

## 2017-03-02 NOTE — Progress Notes (Signed)
PROGRESS NOTE    Janice [name]  OZH:[id number] DOB: 1922/02/26 DOA: 03/01/2017 PCP: Thayer Headings, MD    Brief Narrative:  83 year old female with left leg pain after a fall. Patient does have a significant past medical history for atrial fibrillation, chronic kidney disease, hypothyroidism, and hypertension. Recent hospitalization for cellulitis of the right lower extremity. She fell from her wheelchair, experiencing immediate and severe pain involving the left leg unable to ambulate. On initial physical examination blood pressure 132/57, heart rate 74, respiratory rate 16, temperature 98.5, oxygen saturation 91-93%.moist mucous membranes, lungs clear to auscultation bilaterally, no wheezing rales or rhonchi, heart S1-S2 present rhythmic, 1+ pitting edema bilaterally, abdomen was soft nontender. Sodium 138, potassium 3.4, chloride 101, bicarbonate 24, glucose 124, BUN 26, creatinine 1.15, white count 16.2, hemoglobin 9.0, hematocrit 27.0, platelet 306. Chest film with right rotation, increased lung markings bilaterally. EKG with ventricular paced rhythm.extremity x-ray with moderately displaced proximal left tibial fracture. Severely comminuted and displaced left proximal femur head fracture.   Patient admitted to the hospital working diagnosis left tibia and fibula fractures.  Assessment & Plan:   Principal Problem:   Tibia/fibula fracture, left, closed, initial encounter Active Problems:   Atrial fibrillation (HCC)   Normocytic anemia   Hypothyroidism   Cellulitis of right leg   Benign essential HTN  1. Left tibia and fibula fracture. Patient sp ORIF left proximal tibia, will continue pain control, dvt prophylaxis, and physical therapy. Follow with surgery recommendations.   2. Atrial fibrillation. Remains well controlled, no signs of heart failure, will continue close monitoring. Will continue to hold on furosemide for now. Continue with bisoprolol. Will resume asa, in am.   3.  Anemia. Chronic and stable, will follow on cell count in the post op, will follow a restrictive transfusion strategy.  4. Hypertension. Will continue blood pressure control   5. Hypothyroidism. Will continue levothyroxine.   6. Reactive leukocytosis. Cell count up to 16 from 9,1, no signs of infection, will continue to hold on scheduled antibiotic therapy for now.   DVT prophylaxis: scd  Code Status: Full Family Communication: No family at the bedside Disposition Plan: Home   Consultants:   Orthopedics  Procedures:     Antimicrobials:      Subjective: Patient feeling very cold post surgery, no chest pain, no nausea or vomiting. Positive feet paresthesias.   Objective: Vitals:   03/02/17 0100 03/02/17 0200 03/02/17 0403 03/02/17 0415  BP: (!) 112/54 127/68 (!) 103/43   Pulse: 70 69 69   Resp: Temp:   97.6 F (36.4 C)   TempSrc:   Oral   SpO2: 98% 99% 100%   Weight:    51.4 kg (113 lb 5.1 oz)  Height:     (1.702 m)    Intake/Output Summary (Last 24 hours) at 03/02/17 1131 Last data filed at 03/02/17 0730  Gross per 24 hour  Intake              329 ml  Output              500 ml  Net             -171 ml   Filed Weights   03/02/17 0415  Weight: 51.4 kg (113 lb 5.1 oz)    Examination:  General: Not in pain or dyspnea, deconditioned Neurology: Awake and alert, non focal  E ENT: positive pallor, no icterus, oral mucosa moist Cardiovascular: No JVD. S1-S2 present,  rhythmic, no gallops, rubs, or murmurs. No lower extremity edema. Pulmonary: vesicular breath sounds bilaterally, adequate air movement, no wheezing, rhonchi or rales. Gastrointestinal. Abdomen flat, no organomegaly, non tender, no rebound or guarding Skin. No rashes Musculoskeletal: no joint deformities, left leg with dressing in place.      Data Reviewed: I have personally reviewed following labs and imaging studies  CBC:  Recent Labs Lab 02/25/17 1201 02/25/17 1914  03/01/17 2009  WBC 3.2* 9.1 16.2*  NEUTROABS 2.6  --   --   HGB 17.9* 9.6* 9.0*  HCT 54.3* 29.9* 27.0*  MCV 86.2 86.7 86.5  PLT 126* 257 306   Basic Metabolic Panel:  Recent Labs Lab 02/25/17 1201 02/25/17 1615 03/01/17 2009  NA 136  --  138  K 3.9  --  3.4*  CL 103  --  101  CO2 24  --  24  GLUCOSE 92  --  124*  BUN 23*  --  26*  CREATININE 1.00 1.03* 1.15*  CALCIUM 8.5*  --  8.5*   GFR: Estimated Creatinine Clearance: 24.8 mL/min (A) (by C-G formula based on SCr of 1.15 mg/dL (H)). Liver Function Tests: No results for input(s): AST, ALT, ALKPHOS, BILITOT, PROT, ALBUMIN in the last 168 hours. No results for input(s): LIPASE, AMYLASE in the last 168 hours. No results for input(s): AMMONIA in the last 168 hours. Coagulation Profile:  Recent Labs Lab 03/01/17 2009  INR 1.05   Cardiac Enzymes: No results for input(s): CKTOTAL, CKMB, CKMBINDEX, TROPONINI in the last 168 hours. BNP (last 3 results) No results for input(s): PROBNP in the last 8760 hours. HbA1C: No results for input(s): HGBA1C in the last 72 hours. CBG: No results for input(s): GLUCAP in the last 168 hours. Lipid Profile: No results for input(s): CHOL, HDL, LDLCALC, TRIG, CHOLHDL, LDLDIRECT in the last 72 hours. Thyroid Function Tests: No results for input(s): TSH, T4TOTAL, FREET4, T3FREE, THYROIDAB in the last 72 hours. Anemia Panel: No results for input(s): VITAMINB12, FOLATE, FERRITIN, TIBC, IRON, RETICCTPCT in the last 72 hours.    Radiology Studies: I have reviewed all of the imaging during this hospital visit personally     Scheduled Meds: . [MAR Hold] bisoprolol  5 mg Oral BID  . [MAR Hold] levothyroxine  50 mcg Oral QAC breakfast  . [MAR Hold] multivitamin  1 tablet Oral Daily  . [MAR Hold] pantoprazole  40 mg Oral Daily  . [MAR Hold] simvastatin  5 mg Oral q1800   Continuous Infusions: . [MAR Hold]  ceFAZolin (ANCEF) IV    . lactated ringers 10 mL/hr at 03/02/17 1040     LOS:  1 day        Machel Violante Annett Gula, MD [facility] Pager [phone]

## 2017-03-02 NOTE — Progress Notes (Signed)
Patient's nice Janice [name] which is also her POA consent for patient to continue with surgery with Dr. Jena Gauss at this time.    Sim Boast, RN

## 2017-03-02 NOTE — Anesthesia Preprocedure Evaluation (Addendum)
Anesthesia Evaluation  Patient identified by MRN, date of birth, ID band  Reviewed: Allergy & Precautions, NPO status , Patient's Chart, lab work & pertinent test results, reviewed documented beta blocker date and time   Airway Mallampati: II     Mouth opening: Limited Mouth Opening  Dental  (+) Poor Dentition   Pulmonary    Pulmonary exam normal breath sounds clear to auscultation       Cardiovascular hypertension, Pt. on home beta blockers  Rhythm:Irregular Rate:Normal     Neuro/Psych negative neurological ROS     GI/Hepatic GERD  Medicated,  Endo/Other    Renal/GU Renal InsufficiencyRenal disease     Musculoskeletal   Abdominal (+) + scaphoid   Peds  Hematology  (+) Blood dyscrasia, anemia ,   Anesthesia Other Findings Janice [name]  Pacemaker Device Observation  Order# [id number]  Ordering physician: Marinus Maw, MD Study date: 01/17/2013 Indications   Pacemaker (V45.01 (ICD-9-CM)) Result Report   BATTERY VOLTAGE:  2.78 v    BMOD-0001RV:  Auto (-0.5)    BMOD-0002RV:  Auto (+1)     BMOD-0003RV:  Medium    BMOD-0004RV:  Medium     BRDY-0001RV:  VVIR    BRDY-0002RV:  70 bpm    BRDY-0004RV:  120 bpm   BRDY-0005RV:  60 bpm    BRDY-0009RV:  Yes    [id number]:  [name]     [id number] Bunting M.D.    DEVICE MODEL PM:  [id number]     [facility] NOTES PM Pacemaker check in clinic. Normal device function. Thresholds, sensing, impedances consistent with previous measurements. Device programmed to maximize longevity. No high ventricular rates noted. Device programmed at appropriate safety margins---turned  off RV autocapture due to out of range readings. RV output set to 2.5 V. Histogram distribution appropriate for patient activity level. Device programmed to optimize intrinsic conduction. Estimated longevity 6.50 to 7.50 years. Patient enrolled inTTM's   with [employer]. Spoke w/Melissa at [employer] to update pt's new phone number. [employer] every 3 months and ROV in      Reproductive/Obstetrics                            Anesthesia Physical Anesthesia Plan  ASA: III  Anesthesia Plan: Spinal   Post-op Pain Management:    Induction:   PONV Risk Score and Plan: 2 and Ondansetron, Dexamethasone and Treatment may vary due to age or medical condition  Airway Management Planned: Nasal Cannula and Natural Airway  Additional Equipment:   Intra-op Plan:   Post-operative Plan:   Informed Consent: I have reviewed the patients History and Physical, chart, labs and discussed the procedure including the risks, benefits and alternatives for the proposed anesthesia with the patient or authorized representative who has indicated his/her understanding and acceptance.     Plan Discussed with: CRNA and Surgeon  Anesthesia Plan Comments:        Anesthesia Quick Evaluation

## 2017-03-02 NOTE — Anesthesia Procedure Notes (Signed)
Spinal  Start time: 03/02/2017 12:25 PM End time: 03/02/2017 12:49 PM Staffing Anesthesiologist: Leilani Able Performed: anesthesiologist  Preanesthetic Checklist Completed: patient identified, surgical consent, pre-op evaluation, timeout performed, IV checked, risks and benefits discussed and monitors and equipment checked Spinal Block Patient position: sitting Prep: ChloraPrep and site prepped and draped Patient monitoring: heart rate, continuous pulse ox, blood pressure and cardiac monitor Approach: left paramedian Location: L2-3 Injection technique: single-shot Needle Needle type: Quincke  Needle gauge: 22 G Needle length: 9 cm Needle insertion depth: 6 cm Assessment Sensory level: T8

## 2017-03-02 NOTE — Op Note (Signed)
OrthopaedicSurgeryOperativeNote (MVH:[id number]) Date of Surgery: 03/02/2017  Admit Date: 03/01/2017   Diagnoses: Pre-Op Diagnoses: Left proximal tibia fracture  Post-Op Diagnosis: Same  Procedures: CPT 27758-ORIF of left proximal tibia   Surgeons: Primary: Roby Lofts, MD   Location:[facility] OR ROOM 08   AnesthesiaGeneral   Antibiotics:Ancef 2g preop  Tourniquettime:* No tourniquets in log * .  EstimatedBloodLoss:* No blood loss amount entered *   Complications:* No complications entered in OR log *  Specimens:* No specimens in log *  Implants:  Implant Name Type Inv. Item Serial No. Manufacturer Lot No. LRB No. Used Action  PLATE TIBIA LCP 12H LEFT - [id number] Plate PLATE TIBIA LCP 12H LEFT  [employer]  Left 1 Implanted  SCREW LOCKING SLF TAP 3.5X75MM - [id number] Screw SCREW LOCKING SLF TAP 3.5X75MM  [employer]  Left 1 Implanted  SCREW LOCKING SLF TAP 3.5X80MM - [id number] Screw SCREW LOCKING SLF TAP 3.5X80MM  [employer]  Left 3 Implanted  SCREW LOCKING SLF TAP 3.5X85MM - [id number] Screw SCREW LOCKING SLF TAP 3.5X85MM  [employer]  Left 1 Implanted  SCREW CORTEX 3.5 - [id number] Screw SCREW CORTEX 3.5  [employer]  Left 1 Implanted  SCREW LOCKING 3.5X34 - [id number] Screw SCREW LOCKING 3.5X34  [employer]  Left 1 Implanted  SCREW LOCKING 3.5X30 - [id number] Screw SCREW LOCKING 3.5X30   [employer]   Left 1 Implanted    IndicationsforSurgery: Ms. Janice [name] is a 83 year old female that fell out of her wheelchair yesterday. She was found to have a proximal meta-diaphyseal fracture of her left tibia. I was asked to assume her care from the on-call orthopaedist due to the complexity of her fracture. I examined the patient and reviewed the imagining along with her medical history and felt that proceeding with surgical fixation would be most appropriate. She has significant issues with her skin and is prone to skin tears and  skin breakdown. I felt that prolonged immobilization with a cast or brace would be prohibitive due to her skin. I also feel that pain control would be much better with surgical fixation. I discussed this with the patient and her niece and they agreed to proceed with surgery. Risks discussed included bleeding requiring blood transfusion, bleeding causing a hematoma, infection, malunion, nonunion, damage to surrounding nerves and blood vessels, pain, hardware prominence or irritation, hardware failure, stiffness, post-traumatic arthritis, DVT/PE, compartment syndrome, and even death.Risks and benefits were extensively discussed as noted above and the patient and their family agreed to proceed with surgery and consent was obtained.  Operative Findings: 1. Extra-articular proximal tibial meta-diaphyseal fracture treated with Synthes 3.71mm LCP proximal tibial locking plate 2. Very thin poor skin with multiple area of sloughing around knee  Procedure: The patient was identified in the preoperative holding area. Consent was confirmed with the patient and their family and all questions were answered. The operative extremity was marked after confirmation with the patient. They were then brought back to the operating room by our anesthesia colleagues. The patient was placed under spinal anesthesia and carefully transferred over to a radiolucent flattop table. A bump was placed under the operative hip and bone foam was placed under the operative extremity. The operative extremity was then prepped and draped in usual sterile fashion. A preoperative timeout was performed to verify the patient, the procedure, and the extremity. Preoperative antibiotics were dosed.  I used fluoroscopy to identify the proximal tibia and location for an incision. An oblique incision was  made over Gerdy's tubercle and carried through skin and subcutaneous tissue. The IT band was identified and incised along the length of the incision.  Subperiosteal dissection was carried anteriorly and posterior to make enough surface for the proximal portion of the plate. A Cobb elevator was used to clear a path along the lateral tibia under the anterior tibialis musculature. An appropriate plate length was chosen with fluoroscopic assistance. In this case a 12 hole plate was chosen and slid submuscularly under the anterior tibialis along the lateral tibial cortex.  The proximal portion of the plate was held flush to the proximal tibia with manual force. I did not was to use a clamp as her medial skin was in poor shape and her bone was too bone to support a tine of a clamp. A K-wire was used to provisionally hold the proximal plate to the bone. Provisional reduction was performed and the plate was appropriately aligned on the lateral view and a K-wire was used to hold the position in the anterior posterior direction.  The tibial shaft was medialized and closed reduction techniques were not improving the translation. I made a percutaneous incision over the distal shaft and used a 3.22mm nonlocking screw to correct the translation by bringing the shaft to the plate. Once the alignment was set, I placed four locking screws in the proximal segment. Three locking screws were placed percutaneously in the tibial shaft under fluoroscopic guidance. I placed one more locking screw obliquely in the proximal fragment.  Final fluoroscopic images were obtained in the AP and lateral planes. The incisions were thoroughly irrigated. The IT band was closed with a 0 vicryl over the plate. The incisions were closed with 2-0 vicryl and 3-0 and 2-0 nylon. The skin was very thin and weak and I had a difficult time with the sutures hold and not pulling through. I was eventually able to close all of the incisions. A sterile dressing consisting of bacitracin, mepitel, 4x4s and ACE wrap. The patient was then taken to PACU in stable condition.  Post Op Plan/Instructions: The  patient may be weightbearing as tolerated to transfer to a wheelchair. She will receive postoperative Ancef. DVT prophylaxis will be at the discretion of the primary team.  I was present and performed the entire surgery.  Truitt Merle, MD [facility]

## 2017-03-02 NOTE — Transfer of Care (Signed)
Immediate Anesthesia Transfer of Care Note  Patient: Janice [name]  Procedure(s) Performed: OPEN REDUCTION INTERNAL FIXATION (ORIF) TIBIAL PLATEAU (Left Leg Lower)  Patient Location: PACU  Anesthesia Type:Spinal  Level of Consciousness: awake  Airway & Oxygen Therapy: Patient Spontanous Breathing and Patient connected to nasal cannula oxygen  Post-op Assessment: Report given to RN and Post -op Vital signs reviewed and stable  Post vital signs: Reviewed and stable  Last Vitals:  Vitals:   03/02/17 0403 03/02/17 1448  BP: (!) 103/43 (!) (P) 102/44  Pulse: 69 (P) 70  Resp: 14 (P) 18  Temp: 36.4 C (!) (P) 36.3 C  SpO2: 100% (P) 97%    Last Pain:  Vitals:   03/02/17 1448  TempSrc:   PainSc: (P) Asleep         Complications: No apparent anesthesia complications

## 2017-03-02 NOTE — Progress Notes (Signed)
Initial Nutrition Assessment  DOCUMENTATION CODES:   Underweight  INTERVENTION:  - Monitor for diet advancement, provide medically appropriate nutritional supplements  NUTRITION DIAGNOSIS:   Increased nutrient needs related to wound healing (bone fractures) as evidenced by estimated needs.  GOAL:   Patient will meet greater than or equal to 90% of their needs  MONITOR:   Weight trends, I & O's, Diet advancement, Labs, Skin  REASON FOR ASSESSMENT:   Consult Hip fracture protocol  ASSESSMENT:   Pt with PMH of GERD, A.Fib, hypothyroidism, HTN, renal disorder presents with left tibia and fibula fractures for orthopedic surgery 10/10   RD unable to interview pt d/t surgery.  No meal completion records available given pt's NPO status.  Per chart, pt's weight has remained fairly stable over the past year.  Unable to complete nutrition focused physical exam, RD to complete at follow-up. Pt previously identified with chronic malnutrition on 07/01/2016.   Labs reviewed; K 3.4 Medications reviewed; multivitamin, Protonix  Diet Order:  Diet NPO time specified Except for: Ice Chips, Sips with Meds  Skin:   (Stg I at elbow/heel, Stg II at buttock/ankle/L leg)  Last BM:  03/01/17  Height:   Ht Readings from Last 1 Encounters:  03/02/17  (1.702 m)    Weight:   Wt Readings from Last 1 Encounters:  03/02/17 113 lb 5.1 oz (51.4 kg)    Ideal Body Weight:  61.4 kg  BMI:  Body mass index is 17.75 kg/m.  Estimated Nutritional Needs:   Kcal:  1300-1500  Protein:  77-90  Fluid:  >/= 1.3 L/d  EDUCATION NEEDS:   Education needs no appropriate at this time  Fransisca Kaufmann, MS, RDN, LDN 03/02/2017 2:46 PM

## 2017-03-02 NOTE — ED Notes (Signed)
[facility] called for transport. 

## 2017-03-02 NOTE — Progress Notes (Signed)
Report given to [facility] in [place] stay. Pt is on to OR at this time time.   Sim Boast, RN

## 2017-03-03 ENCOUNTER — Encounter (HOSPITAL_COMMUNITY): Payer: Self-pay | Admitting: Student

## 2017-03-03 LAB — CBC
HCT: 21.8 % — ABNORMAL LOW (ref 36.0–46.0)
HEMOGLOBIN: 7.1 g/dL — AB (ref 12.0–15.0)
MCH: 28.1 pg (ref 26.0–34.0)
MCHC: 32.6 g/dL (ref 30.0–36.0)
MCV: 86.2 fL (ref 78.0–100.0)
PLATELETS: 236 10*3/uL (ref 150–400)
RBC: 2.53 MIL/uL — AB (ref 3.87–5.11)
RDW: 14.7 % (ref 11.5–15.5)
WBC: 11.6 10*3/uL — AB (ref 4.0–10.5)

## 2017-03-03 LAB — PREPARE RBC (CROSSMATCH)

## 2017-03-03 MED ORDER — ACETAMINOPHEN 325 MG PO TABS
650.0000 mg | ORAL_TABLET | Freq: Four times a day (QID) | ORAL | Status: DC | PRN
Start: 2017-03-03 — End: 2017-03-05

## 2017-03-03 MED ORDER — INFLUENZA VAC SPLIT HIGH-DOSE 0.5 ML IM SUSY
0.5000 mL | PREFILLED_SYRINGE | INTRAMUSCULAR | Status: AC | PRN
  Administered 2017-03-05: 0.5 mL via INTRAMUSCULAR
  Filled 2017-03-03: qty 0.5

## 2017-03-03 MED ORDER — CHLORHEXIDINE GLUCONATE CLOTH 2 % EX PADS
6.0000 | MEDICATED_PAD | Freq: Every day | CUTANEOUS | Status: DC
  Administered 2017-03-03 – 2017-03-05 (×3): 6 via TOPICAL

## 2017-03-03 MED ORDER — OXYCODONE HCL 5 MG PO TABS
5.0000 mg | ORAL_TABLET | ORAL | Status: DC | PRN
  Administered 2017-03-03 – 2017-03-05 (×4): 5 mg via ORAL
  Filled 2017-03-03 (×4): qty 1

## 2017-03-03 MED ORDER — MUPIROCIN 2 % EX OINT
1.0000 "application " | TOPICAL_OINTMENT | Freq: Two times a day (BID) | CUTANEOUS | Status: DC
  Administered 2017-03-03 – 2017-03-05 (×5): 1 via NASAL
  Filled 2017-03-03 (×2): qty 22

## 2017-03-03 MED ORDER — SODIUM CHLORIDE 0.9 % IV SOLN: Freq: Once | INTRAVENOUS | Status: DC

## 2017-03-03 NOTE — Progress Notes (Signed)
Orthopaedic Trauma Progress Note  S: Pain controlled, no orthopaedic issues overnight  O:  Vitals:   03/02/17 1900 03/03/17 0501  BP: 108/61 (!) 120/42  Pulse: 88 70  Resp: 16 16  Temp: 99.3 F (37.4 C) 98.7 F (37.1 C)  SpO2: 98% 94%  LLE: Dressing in place, compartments soft and compressible. Active DF/PF, not fully cooperative with sensory exam. Painful to move knee. Warm and well perfused foot  Imaging: Post op x-rays with well aligned fracture  Labs: Hgb 25.68  A/P: 83 year old female s/p ORIF proximal tibia now POD1  -WBAT LLE for transfers only -Dressing clean, dry and intact -DVT per primary, recommend Lovenox -PT/OT -Monitor Hgb, may need transfusion.  Roby Lofts, MD [facility] [phone] (phone)

## 2017-03-03 NOTE — Evaluation (Signed)
Physical Therapy Evaluation Patient Details Name: Janice [name] MRN: [id number] DOB: Jul 05, 1921 Today's Date: 03/03/2017   History of Present Illness  83 y.o. female with medical history significant for atrial fibrillation no longer on anticoagulation, mild renal insufficiency, hypothyroidism,hypertension, and recent hospital admission for cellulitis of the right lower extremity. Admitted after fall at SNF with Left tibia fx s/p ORIF  Clinical Impression  Pt pleasant with slow response to all questions and commands and limited ability to mobilize. Pt holding RLE in knee extension despite LLE being injured. Pt able to perform seated exercises with assist and required 2 person assist for all transfers. Pt incontinent of urine on arrival with purewick not placed correctly, pericare performed and RN aware and present for sacral foam. Pt with decreased strength, function, mobility and independence who will benefit from acute therapy to maximize mobility, independence and strength to decrease burden of care.     Follow Up Recommendations SNF;Supervision/Assistance - 24 hour    Equipment Recommendations  None recommended by PT    Recommendations for Other Services       Precautions / Restrictions Precautions Precautions: Fall Restrictions Weight Bearing Restrictions: Yes LLE Weight Bearing: Weight bearing as tolerated Other Position/Activity Restrictions: for transfers only      Mobility  Bed Mobility Overal bed mobility: Needs Assistance Bed Mobility: Rolling;Supine to Sit Rolling: Max assist   Supine to sit: Max assist;+2 for physical assistance     General bed mobility comments: max assist with use of rail and sequential cues to roll bil with pericare performed and RN notified of sacral skin breakdown and beginning of pressure area on spine. Max assist to pivot to EOB and elevate trunk with use of rail   Transfers Overall transfer level: Needs assistance   Transfers: Sit  to/from Stand;Stand Pivot Transfers Sit to Stand: Mod assist;+2 physical assistance Stand pivot transfers: Max assist;+2 physical assistance       General transfer comment: assist for anterior translation and rise with pad and belt to craddle sacrum with pivot to chair  Ambulation/Gait                Stairs            Wheelchair Mobility    Modified Rankin (Stroke Patients Only)       Balance Overall balance assessment: Needs assistance   Sitting balance-Leahy Scale: Poor       Standing balance-Leahy Scale: Poor                               Pertinent Vitals/Pain Pain Assessment: Faces Pain Score: 4  Pain Location: left knee with flexion Pain Intervention(s): Limited activity within patient's tolerance;Repositioned;Monitored during session    Home Living Family/patient expects to be discharged to:: Skilled nursing facility               Home Equipment: Wheelchair - [employer] - 2 wheels Additional Comments: Patient reports ambulating short distances wtih RW    Prior Function Level of Independence: Needs assistance   Gait / Transfers Assistance Needed: assist with RW and limited distance  ADL's / Homemaking Assistance Needed: assist from staff        Hand Dominance        Extremity/Trunk Assessment   Upper Extremity Assessment Upper Extremity Assessment: Generalized weakness    Lower Extremity Assessment Lower Extremity Assessment: Generalized weakness    Cervical / Trunk Assessment Cervical / Trunk Assessment: Kyphotic  Communication   Communication: HOH  Cognition Arousal/Alertness: Awake/alert Behavior During Therapy: Flat affect Overall Cognitive Status: No family/caregiver present to determine baseline cognitive functioning                                 General Comments: pt oriented to place, time and situation, decreased awareness of deficits and slow to respond to commands      General  Comments      Exercises     Assessment/Plan    PT Assessment Patient needs continued PT services  PT Problem List Decreased strength;Decreased activity tolerance;Decreased balance;Decreased mobility;Decreased knowledge of use of DME;Pain       PT Treatment Interventions DME instruction;Therapeutic activities;Therapeutic exercise;Patient/family education;Balance training;Functional mobility training    PT Goals (Current goals can be found in the Care Plan section)  Acute Rehab PT Goals Patient Stated Goal: return home PT Goal Formulation: With patient Time For Goal Achievement: 03/17/17 Potential to Achieve Goals: Fair    Frequency Min 3X/week   Barriers to discharge        Co-evaluation               AM-PAC PT "6 Clicks" Daily Activity  Outcome Measure Difficulty turning over in bed (including adjusting bedclothes, sheets and blankets)?: Unable Difficulty moving from lying on back to sitting on the side of the bed? : Unable Difficulty sitting down on and standing up from a chair with arms (e.g., wheelchair, bedside commode, etc,.)?: Unable Help needed moving to and from a bed to chair (including a wheelchair)?: Total Help needed walking in hospital room?: Total Help needed climbing 3-5 steps with a railing? : Total 6 Click Score: 6    End of Session Equipment Utilized During Treatment: Gait belt Activity Tolerance: Patient tolerated treatment well Patient left: in chair;with call bell/phone within reach;with nursing/sitter in room;with chair alarm set;Other (comment) (geomat) Nurse Communication: Mobility status;Weight bearing status;Precautions PT Visit Diagnosis: Other abnormalities of gait and mobility (R26.89);Muscle weakness (generalized) (M62.81);History of falling (Z91.81)    Time: 1610-9604 PT Time Calculation (min) (ACUTE ONLY): 26 min   Charges:   PT Evaluation $PT Eval Moderate Complexity: 1 Mod PT Treatments $Therapeutic Activity: 8-22 mins    PT G Codes:        Delaney Meigs, PT [phone]   Dequita Schleicher B Zerenity Bowron 03/03/2017, 10:05 AM

## 2017-03-03 NOTE — Evaluation (Signed)
Occupational Therapy Evaluation Patient Details Name: Janice [name] MRN: [id number] DOB: 02/02/1922 Today's Date: 03/03/2017    History of Present Illness 83 y.o. female with medical history significant for atrial fibrillation no longer on anticoagulation, mild renal insufficiency, hypothyroidism,hypertension, and recent hospital admission for cellulitis of the right lower extremity. Admitted after fall at SNF with Left tibia fx s/p ORIF   Clinical Impression   This 83 y/o F presents with the above. Pt resides in [place], and was previously receiving assistance for ADL completion, was mostly using w/c for functional mobility and RW for short distances. Pt currently requires MaxA +2 for stand pivot transfers, setup-MinA for grooming and UB ADLs, total assist for LB ADLs. Recommend Pt receive additional OT services in SNF setting ([facility]) to progress Pt to PLOF. Will continue to follow acutely.     Follow Up Recommendations  SNF;Supervision/Assistance - 24 hour    Equipment Recommendations  Other (comment) (defer to next venue )           Precautions / Restrictions Precautions Precautions: Fall Restrictions Weight Bearing Restrictions: Yes LLE Weight Bearing: Weight bearing as tolerated Other Position/Activity Restrictions: for transfers only      Mobility Bed Mobility Overal bed mobility: Needs Assistance Bed Mobility: Rolling;Sit to Supine Rolling: Max assist   Supine to sit: Max assist;+2 for physical assistance Sit to supine: Max assist;+2 for physical assistance   General bed mobility comments: MaxA to roll with second person assisting for pericare; Pt required assist for bil LE management and assist to support trunk when returning to supine   Transfers Overall transfer level: Needs assistance   Transfers: Sit to/from Stand;Stand Pivot Transfers Sit to Stand: Max assist;+2 physical assistance Stand pivot transfers: Max assist;+2 physical assistance       General  transfer comment: assist to rise with use of pad and belt to craddle sacrum with pivot from chair to EOB     Balance Overall balance assessment: Needs assistance   Sitting balance-Leahy Scale: Poor       Standing balance-Leahy Scale: Poor                             ADL either performed or assessed with clinical judgement   ADL Overall ADL's : Needs assistance/impaired Eating/Feeding: Set up;Sitting   Grooming: Set up;Minimal assistance;Bed level;Wash/dry face   Upper Body Bathing: Minimal assistance;Sitting   Lower Body Bathing: Moderate assistance;Bed level   Upper Body Dressing : Minimal assistance;Bed level Upper Body Dressing Details (indicate cue type and reason): doffing/donning new gown  Lower Body Dressing: Maximal assistance;Bed level;+2 for physical assistance;+2 for safety/equipment       Toileting- Clothing Manipulation and Hygiene: Total assistance;Bed level;+2 for physical assistance;+2 for safety/equipment Toileting - Clothing Manipulation Details (indicate cue type and reason): assist for bed mobility with second assist for peri care     Functional mobility during ADLs: Maximal assistance;+2 for physical assistance;+2 for safety/equipment General ADL Comments: Pt transferred recliner to EOB with MaxA +2 for stand pivot, requires +2 assist for returning to supine and bed mobility                          Pertinent Vitals/Pain Pain Assessment: Faces Pain Score: 4  Faces Pain Scale: Hurts even more Pain Location: left knee with flexion Pain Intervention(s): Limited activity within patient's tolerance;Monitored during session;Repositioned  Extremity/Trunk Assessment Upper Extremity Assessment Upper Extremity Assessment: Generalized weakness   Lower Extremity Assessment Lower Extremity Assessment: Defer to PT evaluation   Cervical / Trunk Assessment Cervical / Trunk Assessment: Kyphotic   Communication  Communication Communication: HOH   Cognition Arousal/Alertness: Awake/alert Behavior During Therapy: Flat affect Overall Cognitive Status: No family/caregiver present to determine baseline cognitive functioning                                 General Comments: pt appears oriented and appears to be good historian of PLOF, decreased awareness of deficits and slow to respond to commands                   Home Living Family/patient expects to be discharged to:: Skilled nursing facility                             Home Equipment: Wheelchair - [employer] - 2 wheels   Additional Comments: Patient reports ambulating short distances wtih RW      Prior Functioning/Environment Level of Independence: Needs assistance  Gait / Transfers Assistance Needed: assist with RW and limited distance ADL's / Homemaking Assistance Needed: assist from staff for bathing and dressing             OT Problem List: Decreased strength;Impaired balance (sitting and/or standing);Decreased range of motion;Decreased activity tolerance;Decreased cognition;Pain;Decreased knowledge of use of DME or AE      OT Treatment/Interventions: Self-care/ADL training;DME and/or AE instruction;Balance training;Therapeutic activities;Therapeutic exercise;Patient/family education;Energy conservation    OT Goals(Current goals can be found in the care plan section) Acute Rehab OT Goals Patient Stated Goal: return home OT Goal Formulation: With patient Time For Goal Achievement: 03/17/17 Potential to Achieve Goals: Good  OT Frequency: Min 2X/week                             AM-PAC PT "6 Clicks" Daily Activity     Outcome Measure Help from another person eating meals?: A Little Help from another person taking care of personal grooming?: A Little Help from another person toileting, which includes using toliet, bedpan, or urinal?: Total Help from another person bathing (including  washing, rinsing, drying)?: A Lot Help from another person to put on and taking off regular upper body clothing?: A Little Help from another person to put on and taking off regular lower body clothing?: Total 6 Click Score: 13   End of Session Equipment Utilized During Treatment: Gait belt Nurse Communication: Mobility status  Activity Tolerance: Patient tolerated treatment well Patient left: in bed;with call bell/phone within reach;with nursing/sitter in room  OT Visit Diagnosis: Unsteadiness on feet (R26.81);Other abnormalities of gait and mobility (R26.89);History of falling (Z91.81);Muscle weakness (generalized) (M62.81);Pain Pain - Right/Left: Left Pain - part of body: Leg;Knee                Time: 4098-1191 OT Time Calculation (min): 26 min Charges:  OT General Charges $OT Visit: 1 Visit OT Evaluation $OT Eval Moderate Complexity: 1 Mod G-Codes:     Marcy Siren, OT Pager [phone] 03/03/2017   Orlando Penner 03/03/2017, 1:40 PM

## 2017-03-03 NOTE — Progress Notes (Signed)
PROGRESS NOTE    Janice [name]  WUJ:[id number] DOB: 05-30-1921 DOA: 03/01/2017 PCP: Thayer Headings, MD    Brief Narrative:  83 year old female with left leg pain after a fall. Patient does have a significant past medical history for atrial fibrillation, chronic kidney disease, hypothyroidism, and hypertension. Recent hospitalization for cellulitis of the right lower extremity. She fell from her wheelchair, experiencing immediate and severe pain involving the left leg unable to ambulate. On initial physical examination blood pressure 132/57, heart rate 74, respiratory rate 16, temperature 98.5, oxygen saturation 91-93%.moist mucous membranes, lungs clear to auscultation bilaterally, no wheezing rales or rhonchi, heart S1-S2 present rhythmic, 1+ pitting edema bilaterally, abdomen was soft nontender. Sodium 138, potassium 3.4, chloride 101, bicarbonate 24, glucose 124, BUN 26, creatinine 1.15, white count 16.2, hemoglobin 9.0, hematocrit 27.0, platelet 306. Chest film with right rotation, increased lung markings bilaterally. EKG with ventricular paced rhythm.extremity x-ray with moderately displaced proximal left tibial fracture. Severely comminuted and displaced left proximal femur head fracture.   Patient admitted to the hospital working diagnosis left tibia and fibula fractures.   Assessment & Plan:   Principal Problem:   Tibia/fibula fracture, left, closed, initial encounter Active Problems:   Atrial fibrillation (HCC)   Normocytic anemia   Hypothyroidism   Cellulitis of right leg   Benign essential HTN   1. Left tibia and fibula fracture. Patient sp ORIF left proximal tibia. Will add po oxycodone to improve pain control, continue IV morphine as needed for sever pain. Dvt prophylaxis, and physical therapy, recommendations for SNF.   2. Post operative anemia. Hb down to critical level of 7.0, will proceed to transfuse one unit prbc, and will continue to follow up cell count. Positive  bleeding at the site of surgical wound.   2. Atrial fibrillation. Continue well controlled with bisoprolol, no signs of heart failure, will continue close monitoring. Continue to hold on furosemide. Will continue to hold on aspiring in the setting of post op anemia.   3. Hypertension. Systolic blood pressure 120, at home not on antihypertensive medications.   5. Hypothyroidism. On levothyroxine.   6. Reactive leukocytosis. Wbc down to 11,6 from 16,2, no signs of systemic infection, will continue to hold on antibiotic therapy and follow cell count in am.   DVT prophylaxis: scd  Code Status: Full Family Communication: No family at the bedside Disposition Plan: Home   Consultants:   Orthopedics  Procedures:     Antimicrobials:      Subjective: Complains of pain on the left leg, moderate in intensity, no nausea or vomiting, no chest pain or dyspnea. Positive bleeding at the surgical wound.   Objective: Vitals:   03/02/17 1558 03/02/17 1900 03/03/17 0501 03/03/17 1300  BP: 127/62 108/61 (!) 120/42 (!) 127/55  Pulse: 69 88 70 70  Resp: Temp: 97.9 F (36.6 C) 99.3 F (37.4 C) 98.7 F (37.1 C) 98.7 F (37.1 C)  TempSrc: Oral Oral Axillary Oral  SpO2: 98% 98% 94%   Weight:      Height:        Intake/Output Summary (Last 24 hours) at 03/03/17 1410 Last data filed at 03/03/17 1200  Gross per 24 hour  Intake             1785 ml  Output              650 ml  Net             1135 ml  Filed Weights   03/02/17 0415  Weight: 51.4 kg (113 lb 5.1 oz)    Examination:  General: Not in pain or dyspnea, deconditioned Neurology: Awake and alert, non focal  E ENT: mild pallor, no icterus, oral mucosa moist Cardiovascular: No JVD. S1-S2 present, rhythmic, no gallops, rubs, or murmurs. No lower extremity edema. Pulmonary: vesicular breath sounds bilaterally, adequate air movement, no wheezing, rhonchi or rales. Gastrointestinal. Abdomen flat, no  organomegaly, non tender, no rebound or guarding Skin. No rashes Musculoskeletal: no joint deformities/ left leg with dressing in place.      Data Reviewed: I have personally reviewed following labs and imaging studies  CBC:  Recent Labs Lab 02/25/17 1201 02/25/17 1914 03/01/17 2009 03/03/17 0527  WBC 3.2* 9.1 16.2* 11.6*  NEUTROABS 2.6  --   --   --   HGB 17.9* 9.6* 9.0* 7.1*  HCT 54.3* 29.9* 27.0* 21.8*  MCV 86.2 86.7 86.5 86.2  PLT 126* 257 306 236   Basic Metabolic Panel:  Recent Labs Lab 02/25/17 1201 02/25/17 1615 03/01/17 2009  NA 136  --  138  K 3.9  --  3.4*  CL 103  --  101  CO2 24  --  24  GLUCOSE 92  --  124*  BUN 23*  --  26*  CREATININE 1.00 1.03* 1.15*  CALCIUM 8.5*  --  8.5*   GFR: Estimated Creatinine Clearance: 24.8 mL/min (A) (by C-G formula based on SCr of 1.15 mg/dL (H)). Liver Function Tests: No results for input(s): AST, ALT, ALKPHOS, BILITOT, PROT, ALBUMIN in the last 168 hours. No results for input(s): LIPASE, AMYLASE in the last 168 hours. No results for input(s): AMMONIA in the last 168 hours. Coagulation Profile:  Recent Labs Lab 03/01/17 2009  INR 1.05   Cardiac Enzymes: No results for input(s): CKTOTAL, CKMB, CKMBINDEX, TROPONINI in the last 168 hours. BNP (last 3 results) No results for input(s): PROBNP in the last 8760 hours. HbA1C: No results for input(s): HGBA1C in the last 72 hours. CBG: No results for input(s): GLUCAP in the last 168 hours. Lipid Profile: No results for input(s): CHOL, HDL, LDLCALC, TRIG, CHOLHDL, LDLDIRECT in the last 72 hours. Thyroid Function Tests: No results for input(s): TSH, T4TOTAL, FREET4, T3FREE, THYROIDAB in the last 72 hours. Anemia Panel: No results for input(s): VITAMINB12, FOLATE, FERRITIN, TIBC, IRON, RETICCTPCT in the last 72 hours.    Radiology Studies: I have reviewed all of the imaging during this hospital visit personally     Scheduled Meds: . bisoprolol  5 mg Oral  BID  . Chlorhexidine Gluconate Cloth  6 each Topical Daily  . levothyroxine  50 mcg Oral QAC breakfast  . multivitamin  1 tablet Oral Daily  . mupirocin ointment  1 application Nasal BID  . pantoprazole  40 mg Oral Daily  . simvastatin  5 mg Oral q1800   Continuous Infusions:   LOS: 2 days        Clover Feehan Annett Gula, MD [facility] Pager [phone]

## 2017-03-04 LAB — CBC WITH DIFFERENTIAL/PLATELET
BASOS ABS: 0 10*3/uL (ref 0.0–0.1)
Basophils Relative: 0 %
Eosinophils Absolute: 0.3 10*3/uL (ref 0.0–0.7)
Eosinophils Relative: 4 %
HCT: 24.7 % — ABNORMAL LOW (ref 36.0–46.0)
HEMOGLOBIN: 8 g/dL — AB (ref 12.0–15.0)
Lymphocytes Relative: 15 %
Lymphs Abs: 1.2 10*3/uL (ref 0.7–4.0)
MCH: 28.4 pg (ref 26.0–34.0)
MCHC: 32.4 g/dL (ref 30.0–36.0)
MCV: 87.6 fL (ref 78.0–100.0)
Monocytes Absolute: 0.6 10*3/uL (ref 0.1–1.0)
Monocytes Relative: 7 %
NEUTROS PCT: 74 %
Neutro Abs: 6.2 10*3/uL (ref 1.7–7.7)
PLATELETS: 201 10*3/uL (ref 150–400)
RBC: 2.82 MIL/uL — AB (ref 3.87–5.11)
RDW: 14.5 % (ref 11.5–15.5)
WBC: 8.4 10*3/uL (ref 4.0–10.5)

## 2017-03-04 LAB — BASIC METABOLIC PANEL
ANION GAP: 8 (ref 5–15)
BUN: 20 mg/dL (ref 6–20)
CHLORIDE: 108 mmol/L (ref 101–111)
CO2: 24 mmol/L (ref 22–32)
Calcium: 7.9 mg/dL — ABNORMAL LOW (ref 8.9–10.3)
Creatinine, Ser: 0.9 mg/dL (ref 0.44–1.00)
GFR calc Af Amer: >60 mL/min (ref >60)
GFR, EST NON AFRICAN AMERICAN: 53 mL/min — AB (ref >60)
GLUCOSE: 86 mg/dL (ref 65–99)
POTASSIUM: 3.8 mmol/L (ref 3.5–5.1)
SODIUM: 140 mmol/L (ref 135–145)

## 2017-03-04 MED ORDER — FAMOTIDINE 20 MG PO TABS
20.0000 mg | ORAL_TABLET | Freq: Every day | ORAL | Status: DC
  Administered 2017-03-05: 20 mg via ORAL
  Filled 2017-03-04: qty 1

## 2017-03-04 MED ORDER — ENSURE ENLIVE PO LIQD
237.0000 mL | Freq: Every day | ORAL | Status: DC
  Administered 2017-03-05: 237 mL via ORAL

## 2017-03-04 NOTE — Progress Notes (Signed)
Nutrition Follow-up  DOCUMENTATION CODES:   Severe malnutrition in context of chronic illness, Underweight  INTERVENTION:  Provide Ensure Enlive po once daily, each supplement provides 350 kcal and 20 grams of protein.  Encourage adequate PO intake.   NUTRITION DIAGNOSIS:   Malnutrition (severe) related to chronic illness as evidenced by severe depletion of body fat, severe depletion of muscle mass; ongoing  GOAL:   Patient will meet greater than or equal to 90% of their needs; progressing  MONITOR:   PO intake, Supplement acceptance, Labs, Weight trends, Skin, I & O's  REASON FOR ASSESSMENT:   Consult Hip fracture protocol  ASSESSMENT:   Pt with PMH of GERD, A.Fib, hypothyroidism, HTN, renal disorder presents with left tibia and fibula fractures for orthopedic surgery 10/10  Pt was asleep during time of visit. No family at bedside. Meal completion has been 50-75% with 75% at breakfast this AM. RD to order nutritional supplements to aid in post op healing.   Limited Nutrition-Focused physical exam completed. Findings are severe fat depletion, severe muscle depletion, and mild edema.   Labs and medications reviewed.   Diet Order:  Diet Heart Room service appropriate? Yes; Fluid consistency: Thin  Skin:  Wound (see comment) (Stg I at elbow/heel, Stg II at buttock/ankle/L leg)  Last BM:  10/12  Height:   Ht Readings from Last 1 Encounters:  03/02/17  (1.702 m)    Weight:   Wt Readings from Last 1 Encounters:  03/02/17 113 lb 5.1 oz (51.4 kg)    Ideal Body Weight:  61.4 kg  BMI:  Body mass index is 17.75 kg/m.  Estimated Nutritional Needs:   Kcal:  1300-1500  Protein:  77-90 grams  Fluid:  >/= 1.3 L/d  EDUCATION NEEDS:   Education needs no appropriate at this time  Roslyn Smiling, MS, RD, LDN Pager # [phone] After hours/ weekend pager # [phone]

## 2017-03-04 NOTE — Progress Notes (Signed)
PROGRESS NOTE    Janice [name]  ZOX:[id number] DOB: December 24, 1921 DOA: 03/01/2017 PCP: Thayer Headings, MD    Brief Narrative:  83 year old female with left leg pain after a fall. Patient does have a significant past medical history for atrial fibrillation, chronic kidney disease, hypothyroidism, and hypertension. Recent hospitalization for cellulitis of the right lower extremity. She fell from her wheelchair, experiencing immediate and severe pain involving the left leg unable to ambulate. On initial physical examination blood pressure 132/57, heart rate 74, respiratory rate 16, temperature 98.5, oxygen saturation 91-93%.moist mucous membranes, lungs clear to auscultation bilaterally, no wheezing rales or rhonchi, heart S1-S2 present rhythmic, 1+ pitting edema bilaterally, abdomen was soft nontender. Sodium 138, potassium 3.4, chloride 101, bicarbonate 24, glucose 124, BUN 26, creatinine 1.15, white count 16.2, hemoglobin 9.0, hematocrit 27.0, platelet 306. Chest film with right rotation, increased lung markings bilaterally. EKG with ventricular paced rhythm.extremity x-ray with moderately displaced proximal left tibial fracture. Severely comminuted and displaced left proximal femur head fracture.   Patient admitted to the hospital working diagnosis left tibia and fibula fractures.   Assessment & Plan:   Principal Problem:   Tibia/fibula fracture, left, closed, initial encounter Active Problems:   Atrial fibrillation (HCC)   Normocytic anemia   Hypothyroidism   Cellulitis of right leg   Benign essential HTN  1. Left tibia and fibula fracture. Patient sp ORIF left proximal tibia.Contiue pain control with oxycodone, IV morphine as needed for sever pain. Physical therapy evaluation, dvt prophylaxis, social services for placement.   2. Post operative anemia. Appropriate response to PRBC transfusion, one unit, Hb up to 8 with hct at  24,7.  Will continue to follow on cell count in am.   2.  Atrial fibrillation. Continue  bisoprolol for rate control. Holding furosemide and aspirin for now.   3. Hypertension. Continue close monitoring, systolic blood pressure 114 to 119.   5. Hypothyroidism. Continue levothyroxine 50 mcg daily.   6. Reactive leukocytosis. Continue to improve leukocytosis, with wbc this am down to 8.4. No signs of infection.   DVT prophylaxis:scd/ GI prophylaxis, will change to H2 blocker to prevent side effects.  Code Status:Full Family Communication:No family at the bedside Disposition Plan:Home   Consultants:  Orthopedics  Procedures:    Antimicrobials:    Subjective: Patient tolerated prbc transfusion well, no dyspnea or chest pain. No nausea or vomiting. Right lower extremity pain has improved.   Objective: Vitals:   03/03/17 2347 03/04/17 0002 03/04/17 0300 03/04/17 0634  BP: (!) 126/52 (!) 123/53 114/83 (!) 119/49  Pulse: 85 70 75 70  Resp: Temp: 98.9 F (37.2 C) 98.4 F (36.9 C) (!) 97.5 F (36.4 C) 98.1 F (36.7 C)  TempSrc: Oral Oral Oral Oral  SpO2: 92%   90%  Weight:      Height:        Intake/Output Summary (Last 24 hours) at 03/04/17 0853 Last data filed at 03/03/17 2347  Gross per 24 hour  Intake              350 ml  Output              450 ml  Net             -100 ml   Filed Weights   03/02/17 0415  Weight: 51.4 kg (113 lb 5.1 oz)    Examination:  General: Not in painORAH SONNEN, deconditioned Neurology: Awake and alert, non focal  E  ENT: no pallor, no icterus, oral mucosa moist Cardiovascular: No JVD. S1-S2 present, rhythmic, no gallops, rubs, or murmurs. No lower extremity edema. Pulmonary: vesicular breath sounds bilaterally, adequate air movement, no wheezing, rhonchi or rales. Gastrointestinal. Abdomen flat, no organomegaly, non tender, no rebound or guarding Skin. No rashes Musculoskeletal: no joint deformities/ right lower extremity with dressing in place.      Data  Reviewed: I have personally reviewed following labs and imaging studies  CBC:  Recent Labs Lab 02/25/17 1201 02/25/17 1914 03/01/17 2009 03/03/17 0527 03/04/17 0617  WBC 3.2* 9.1 16.2* 11.6* 8.4  NEUTROABS 2.6  --   --   --  6.2  HGB 17.9* 9.6* 9.0* 7.1* 8.0*  HCT 54.3* 29.9* 27.0* 21.8* 24.7*  MCV 86.2 86.7 86.5 86.2 87.6  PLT 126* 257 306 236 201   Basic Metabolic Panel:  Recent Labs Lab 02/25/17 1201 02/25/17 1615 03/01/17 2009 03/04/17 0617  NA 136  --  138 140  K 3.9  --  3.4* 3.8  CL 103  --  101 108  CO2 24  --  24 24  GLUCOSE 92  --  124* 86  BUN 23*  --  26* 20  CREATININE 1.00 1.03* 1.15* 0.90  CALCIUM 8.5*  --  8.5* 7.9*   GFR: Estimated Creatinine Clearance: 31.7 mL/min (by C-G formula based on SCr of 0.9 mg/dL). Liver Function Tests: No results for input(s): AST, ALT, ALKPHOS, BILITOT, PROT, ALBUMIN in the last 168 hours. No results for input(s): LIPASE, AMYLASE in the last 168 hours. No results for input(s): AMMONIA in the last 168 hours. Coagulation Profile:  Recent Labs Lab 03/01/17 2009  INR 1.05   Cardiac Enzymes: No results for input(s): CKTOTAL, CKMB, CKMBINDEX, TROPONINI in the last 168 hours. BNP (last 3 results) No results for input(s): PROBNP in the last 8760 hours. HbA1C: No results for input(s): HGBA1C in the last 72 hours. CBG: No results for input(s): GLUCAP in the last 168 hours. Lipid Profile: No results for input(s): CHOL, HDL, LDLCALC, TRIG, CHOLHDL, LDLDIRECT in the last 72 hours. Thyroid Function Tests: No results for input(s): TSH, T4TOTAL, FREET4, T3FREE, THYROIDAB in the last 72 hours. Anemia Panel: No results for input(s): VITAMINB12, FOLATE, FERRITIN, TIBC, IRON, RETICCTPCT in the last 72 hours.    Radiology Studies: I have reviewed all of the imaging during this hospital visit personally     Scheduled Meds: . bisoprolol  5 mg Oral BID  . Chlorhexidine Gluconate Cloth  6 each Topical Daily  .  levothyroxine  50 mcg Oral QAC breakfast  . multivitamin  1 tablet Oral Daily  . mupirocin ointment  1 application Nasal BID  . pantoprazole  40 mg Oral Daily  . simvastatin  5 mg Oral q1800   Continuous Infusions: . sodium chloride       LOS: 3 days        Mauricio Annett Gula, MD [facility] Pager [phone]

## 2017-03-04 NOTE — Care Management Important Message (Signed)
Important Message  Patient Details  Name: Janice [name] MRN: [id number] Date of Birth: 1922-01-09   Medicare Important Message Given:  Yes    Kyla Balzarine 03/04/2017, 11:23 AM

## 2017-03-04 NOTE — NC FL2 (Signed)
[place] MEDICAID FL2 LEVEL OF CARE SCREENING TOOL     IDENTIFICATION  Patient Name: Janice [name] Birthdate: 1921/09/26 Sex: female Admission Date (Current Location): 03/01/2017  [facility] and [place] Number:  Producer, television/film/video and Address:  [facility], [address], [place], [place] [address]      Provider Number: [id number]  Attending Physician Name and Address:  Coralie Keens  Relative Name and Phone Number:       Current Level of Care: Hospital Recommended Level of Care: Skilled Nursing Facility Prior Approval Number:    Date Approved/Denied:   PASRR Number: [id number]  Discharge Plan: SNF    Current Diagnoses: Patient Active Problem List   Diagnosis Date Noted  . Tibia/fibula fracture, left, closed, initial encounter 03/01/2017  . Cellulitis 02/27/2017  . Pressure injury of skin 02/26/2017  . Cellulitis of right leg 02/26/2017  . Benign essential HTN 02/26/2017  . Hypothyroidism 04/19/2012  . Temporal arteritis (HCC) 04/19/2012  . Protein-calorie malnutrition, severe (HCC) 04/19/2012  . Pacemaker 02/08/2012  . Normocytic anemia 12/29/2011  . Atrial fibrillation (HCC) 12/23/2009  . GERD 12/23/2009    Orientation RESPIRATION BLADDER Height & Weight     Self, Place  O2 (Nasal Cannula 2L) Indwelling catheter Weight: 113 lb 5.1 oz (51.4 kg) Height:   (170.2 cm)  BEHAVIORAL SYMPTOMS/MOOD NEUROLOGICAL BOWEL NUTRITION STATUS      Continent Diet (See DC Summary)  AMBULATORY STATUS COMMUNICATION OF NEEDS Skin   Extensive Assist Verbally PU Stage and Appropriate Care PU Stage 1 Dressing: TID PU Stage 2 Dressing: TID                   Personal Care Assistance Level of Assistance  Bathing, Dressing, Feeding Bathing Assistance: Limited assistance Feeding assistance: Limited assistance Dressing Assistance: Maximum assistance     Functional Limitations Info             SPECIAL CARE FACTORS FREQUENCY  PT  (By licensed PT), OT (By licensed OT)     PT Frequency: 3x week OT Frequency: 2x week            Contractures Contractures Info: Not present    Additional Factors Info  Code Status, Allergies Code Status Info: Full code Allergies Info: SULFA ANTIBIOTICS            Current Medications (03/04/2017):  This is the current hospital active medication list Current Facility-Administered Medications  Medication Dose Route Frequency Provider Last Rate Last Dose  . 0.9 %  sodium chloride infusion   Intravenous Once Arrien, York Ram, MD      . acetaminophen (TYLENOL) tablet 650 mg  650 mg Oral Q6H PRN Arrien, York Ram, MD      . bisacodyl (DULCOLAX) EC tablet 5 mg  5 mg Oral Daily PRN Opyd, Lavone Neri, MD      . bisoprolol (ZEBETA) tablet 5 mg  5 mg Oral BID Opyd, Lavone Neri, MD   5 mg at 03/04/17 1028  . Chlorhexidine Gluconate Cloth 2 % PADS 6 each  6 each Topical Daily Arrien, York Ram, MD   6 each at 03/04/17 1029  . famotidine (PEPCID) tablet 20 mg  20 mg Oral Daily Arrien, York Ram, MD      . guaiFENesin-dextromethorphan [facility] DM) 100-10 MG/5ML syrup 10 mL  10 mL Oral Q4H PRN Briscoe Deutscher, MD   10 mL at 03/02/17 2118  . Influenza vac split quadrivalent PF (FLUZONE HIGH-DOSE)  injection 0.5 mL  0.5 mL Intramuscular Prior to discharge Arrien, York Ram, MD      . levothyroxine (SYNTHROID, LEVOTHROID) tablet 50 mcg  50 mcg Oral QAC breakfast Opyd, Lavone Neri, MD   50 mcg at 03/04/17 1028  . morphine 4 MG/ML injection 0.52-1 mg  0.52-1 mg Intravenous Q2H PRN Opyd, Lavone Neri, MD   1 mg at 03/03/17 1057  . multivitamin (PROSIGHT) tablet 1 tablet  1 tablet Oral Daily Opyd, Lavone Neri, MD   1 tablet at 03/04/17 1028  . mupirocin ointment (BACTROBAN) 2 % 1 application  1 application Nasal BID Arrien, York Ram, MD   1 application at 03/04/17 1029  . oxyCODONE (Oxy IR/ROXICODONE) immediate release tablet 5 mg  5 mg Oral Q4H PRN Arrien, York Ram,  MD   5 mg at 03/03/17 2358  . senna-docusate (Senokot-S) tablet 1 tablet  1 tablet Oral QHS PRN Opyd, Lavone Neri, MD   1 tablet at 03/03/17 1058  . simvastatin (ZOCOR) tablet 5 mg  5 mg Oral q1800 Opyd, Lavone Neri, MD   5 mg at 03/03/17 1732     Discharge Medications: Please see discharge summary for a list of discharge medications.  Relevant Imaging Results:  Relevant Lab Results:   Additional Information SS#:[id number], LCSW

## 2017-03-04 NOTE — Clinical Social Work Note (Signed)
Clinical Social Work Assessment  Patient Details  Name: Janice [name] MRN: [id number] Date of Birth: February 22, 1922  Date of referral:  03/04/17               Reason for consult:  Facility Placement                Permission sought to share information with:  Oceanographer granted to share information::  Yes, Verbal Permission Granted  Name::     Hilary Hertz  Agency::  SNF  Relationship::     Contact Information:     Housing/Transportation Living arrangements for the past 2 months:  Assisted Dealer of Information:  Facility, Other (Comment Required) (Friends) Patient Interpreter Needed:  None Criminal Activity/Legal Involvement Pertinent to Current Situation/Hospitalization:  No - Comment as needed Significant Relationships:  Other Family Members, Church Lives with:  Facility Resident Do you feel safe going back to the place where you live?  No Need for family participation in patient care:  Yes (Comment)  Care giving concerns:  Pt is a resident at [facility] and had a fall. Pt will need short term rehab. While at ALF patient in her own apartment however requires much assistance with ADL's and getting around in wheel chair. Pt fell reaching for fruit while seated in wheelchair. Pt not safe to return home at this time.  Social Worker assessment / plan:  CSW contacted niece, Larita Fife to explain the SNF process and options. Niece was under the impression that aunt would not need short term rehab. CSW discussed with niece  that Physical therapy recommended and that patient would return home to ALF once she is improved. Niece had more medical questions and CSW recommended she f/u directly with nurse and doctor. Family  Agreeable with SNF placement. CSW will work on and send offers and discuss with niece to select bed placement.  Employment status:  Retired Database administrator PT Recommendations:  Skilled Nursing  Facility Information / Referral to community resources:  Skilled Nursing Facility  Patient/Family's Response to care:  Family appreciative of CSW assistance with SNF placement. No issues or concerns at this time.  Patient/Family's Understanding of and Emotional Response to Diagnosis, Current Treatment, and Prognosis:  Family appreciative has some understanding of diagnosis,current treatment and prognosis. Family hopeful patient will improve and get back to ALF. CSW discussed with niece patients needs at ALF and maybe coming up with some long term options. No other issues or concerns identified.  Emotional Assessment Appearance:  Appears stated age Attitude/Demeanor/Rapport:  Other (Quiet) Affect (typically observed):  Quiet Orientation:  Oriented to Self, Oriented to Place Alcohol / Substance use:  Not Applicable Psych involvement (Current and /or in the community):  No (Comment)  Discharge Needs  Concerns to be addressed:  Care Coordination Readmission within the last 30 days:  Yes Current discharge risk:  Physical Impairment, Dependent with Mobility Barriers to Discharge:  No Barriers Identified   Tresa Moore, LCSW 03/04/2017, 2:05 PM

## 2017-03-05 DIAGNOSIS — D62 Acute posthemorrhagic anemia: Secondary | ICD-10-CM

## 2017-03-05 MED ORDER — ENSURE ENLIVE PO LIQD: 237.0000 mL | Freq: Every day | ORAL | 12 refills | Status: AC

## 2017-03-05 MED ORDER — TRAMADOL HCL 50 MG PO TABS: 50.0000 mg | ORAL_TABLET | Freq: Two times a day (BID) | ORAL | 0 refills | Status: AC

## 2017-03-05 MED ORDER — BISACODYL 5 MG PO TBEC: 5.0000 mg | DELAYED_RELEASE_TABLET | Freq: Every day | ORAL | 0 refills | Status: AC | PRN

## 2017-03-05 MED ORDER — OXYCODONE HCL 5 MG PO TABS: 5.0000 mg | ORAL_TABLET | Freq: Four times a day (QID) | ORAL | 0 refills | Status: AC | PRN

## 2017-03-05 MED ORDER — SENNOSIDES-DOCUSATE SODIUM 8.6-50 MG PO TABS: 1.0000 | ORAL_TABLET | Freq: Two times a day (BID) | ORAL | 0 refills | Status: AC

## 2017-03-05 NOTE — Clinical Social Work Note (Signed)
Clinical Social Worker facilitated patient discharge including contacting patient family (Niece Orlie Pollen) and facility [facility]) to confirm patient discharge plans. Clinical information faxed to facility and family agreeable with plan. CSW arranged ambulance transport via [facility] to [facility] (4PM) . RN to call report to [phone] prior to discharge.  Clinical Social Worker will sign off for now as social work intervention is no longer needed. Please consult [place] again if new need arises.  Katha Kuehne B. Gean Quint Clinical Social Work Dept Weekend Social Worker [phone] 12:48 PM

## 2017-03-05 NOTE — Clinical Social Work Note (Signed)
CSW notified by MD, pt is ready to DC to SNF. CSW contacted pt's niece [phone] to provide a list of bed offers, no answer, VM left with CSW's contact information.  Following for DC planning.  Marcelene Weidemann B. Gean Quint Clinical Social Work Dept Weekend Social Worker [phone] 12:02 PM

## 2017-03-05 NOTE — Progress Notes (Signed)
IV removed, patient provided with discharge instructions and prescriptions, verbally understood. Patient being discharged to SNF([facility]). Patient's family member and POA contacted by CSW.

## 2017-03-05 NOTE — Clinical Social Work Placement (Signed)
   CLINICAL SOCIAL WORK PLACEMENT  NOTE  Date:  03/05/2017  Patient Details  Name: Janice [name] MRN: [id number] Date of Birth: February 21, 1922  Clinical Social Work is seeking post-discharge placement for this patient at the Skilled  Nursing Facility level of care (*CSW will initial, date and re-position this form in  chart as items are completed):  Yes   Patient/family provided with [facility] Work Department's list of facilities offering this level of care within the geographic area requested by the patient (or if unable, by the patient's family).  Yes   Patient/family informed of their freedom to choose among providers that offer the needed level of care, that participate in Medicare, Medicaid or managed care program needed by the patient, have an available bed and are willing to accept the patient.  Yes   Patient/family informed of [facility]'s ownership interest in [facility] and [facility], as well as of the fact that they are under no obligation to receive care at these facilities.  PASRR submitted to EDS on       PASRR number received on       Existing PASRR number confirmed on 03/05/17     FL2 transmitted to all facilities in geographic area requested by pt/family on       FL2 transmitted to all facilities within larger geographic area on       Patient informed that his/her managed care company has contracts with or will negotiate with certain facilities, including the following:        Yes   Patient/family informed of bed offers received.  Patient chooses bed at  [facility])     Physician recommends and patient chooses bed at      Patient to be transferred to  ([facility]) on 03/05/17.  Patient to be transferred to facility by  [name])     Patient family notified on 03/05/17 of transfer.  Name of family member notified:  Niece Ireland     PHYSICIAN       Additional Comment:     _______________________________________________ Norlene Duel, LCSWA 03/05/2017, 12:51 PM

## 2017-03-05 NOTE — Discharge Summary (Signed)
Physician Discharge Summary  CHRISTEENA KROGH ZOX:[id number] DOB: [birth year] DOA: 03/01/2017  PCP: Thayer Headings, MD  Admit date: 03/01/2017 Discharge date: 03/05/2017  Admitted From: Home Disposition:  SNF  Recommendations for Outpatient Follow-up:  1. Follow up with PCP in 1-week 2. Patient placed on bowel regimen for pain control 3. Furosemide has been held to prevent renal injury and electrolytes abnormalities. 4. Weight bearing as tolerated on the left lower extremity for transfers only, daily dressing changes with Adaptic, 4 x 4's and Kerlix.  5. Patient will be discharged on as needed oxycodone for pain control, continue tramadol and acetaminophen, she was started on bowel regimen with senna and Dulcolax.   Home Health: Na Equipment/Devices: Na   Discharge Condition: stable CODE STATUS: DNR  Diet recommendation: Heart healthy  Brief/Interim Summary: 83 year old female presented with left leg pain after a fall. Patient does have a significant past medical history for atrial fibrillation, chronic kidney disease, hypothyroidism, and hypertension. Recent hospitalization for cellulitis of the right lower extremity. She fell from her wheelchair, experiencing immediate and severe pain involving the left leg unable to ambulate. On initial physical examination blood pressure 132/57, heart rate 74, respiratory rate 16, temperature 98.5, oxygen saturation 91-93%.moist mucous membranes, lungs clear to auscultation bilaterally, no wheezing rales or rhonchi, heart S1-S2 present rhythmic, 1+ pitting edema bilaterally, abdomen was soft nontender. Sodium 138, potassium 3.4, chloride 101, bicarbonate 24, glucose 124, BUN 26, creatinine 1.15, white count 16.2, hemoglobin 9.0, hematocrit 27.0, platelet 306. Chest film with right rotation, increased lung markings bilaterally. EKG with ventricular paced rhythm. Left lower extremity x-ray with moderately displaced proximal left tibial fracture. Severely  comminuted and displaced left proximal fibular head fracture. Chronic fracture and dislocation of the right hip  Patient admitted to the hospital working diagnosis left tibia and fibula fractures.  1. Left tibia and fibula fractures. Patient was admitted to the medical ward, she was placed on IV analgesics, and DVT prophylaxis. Patient was seen by the orthopedic surgery team, and she underwent a open reduction internal fixation of left proximal tibia. She responded well to the surgical intervention, she was seen by physical therapy, with recommendations to continue therapy at skilled nursing facility. Weight bearing as tolerated on the left lower extremity for transfers only, daily dressing changes with Adaptic, 4 x 4's and Kerlix.   2. Postoperative anemia. Patient experienced a drop in her hemoglobin, down to 7.1, patient received 1 unit of packed red blood cells, with improvement of hemoglobin up to 8 and hematocrit 24.7. No further bleeding, aspirin will be resumed at discharge.  3. Atrial fibrillation. Patient on ventricular paced rhythm, bisoprolol was continued during hospitalization, with adequate rate control. Will hold furosemide for now to prevent electrolyte abnormalities or kidney injury. Resume aspirin at discharge.  4. Hypertension. Blood pressure remained well-controlled, continue bisoprolol.  5. Hypothyroidism. Continue levothyroxine.  6. Reactive leukocytosis. No signs of systemic infection, cell count was monitored closely, with a discharge white cell count of 8.4.     Discharge Diagnoses:  Principal Problem:   Tibia/fibula fracture, left, closed, initial encounter Active Problems:   Atrial fibrillation (HCC)   Normocytic anemia   Hypothyroidism   Cellulitis of right leg   Benign essential HTN    Discharge Instructions   Allergies as of 03/05/2017      Reactions   Sulfa Antibiotics Nausea Only      Medication List    STOP taking these medications    furosemide 20 MG tablet Commonly known  as:  LASIX     TAKE these medications   acetaminophen 325 MG tablet Commonly known as:  TYLENOL Take 650 mg by mouth every 6 (six) hours as needed for moderate pain.   aspirin EC 81 MG tablet Take 81 mg by mouth every evening.   beta carotene w/minerals tablet Take 1 tablet by mouth at bedtime.   bisacodyl 5 MG EC tablet Commonly known as:  DULCOLAX Take 1 tablet (5 mg total) by mouth daily as needed for moderate constipation.   bisoprolol 5 MG tablet Commonly known as:  ZEBETA Take 5 mg by mouth 2 (two) times daily.   CALCIUM-VITAMIN D-MINERALS PO Take 1 tablet by mouth 2 (two) times daily.   feeding supplement (ENSURE ENLIVE) Liqd Take 237 mLs by mouth daily at 3 pm.   guaiFENesin-dextromethorphan 100-10 MG/5ML syrup Commonly known as:  ROBITUSSIN DM Take 10 mLs by mouth every 4 (four) hours as needed for cough.   levothyroxine 50 MCG tablet Commonly known as:  SYNTHROID, LEVOTHROID Take 50 mcg by mouth daily.   omeprazole 20 MG capsule Commonly known as:  PRILOSEC Take 20 mg by mouth daily.   oxyCODONE 5 MG immediate release tablet Commonly known as:  Oxy IR/ROXICODONE Take 1 tablet (5 mg total) by mouth every 6 (six) hours as needed for moderate pain.   pseudoephedrine-guaifenesin 60-600 MG 12 hr tablet Commonly known as:  MUCINEX D Take 2 tablets by mouth daily as needed for congestion.   senna-docusate 8.6-50 MG tablet Commonly known as:  Senokot-S Take 1 tablet by mouth 2 (two) times daily.   simvastatin 5 MG tablet Commonly known as:  ZOCOR Take 5 mg by mouth at bedtime.   traMADol 50 MG tablet Commonly known as:  ULTRAM Take 1 tablet (50 mg total) by mouth 2 (two) times daily.      Contact information for after-discharge care    Destination    [facility] .   Specialty:  Skilled Nursing Facility Contact information: [address] [place]  [address] [phone]             Allergies  Allergen Reactions  . Sulfa Antibiotics Nausea Only    Consultations:  Orthopedic surgery   Procedures/Studies: Dg Knee 1-2 Views Left  Result Date: 03/02/2017 CLINICAL DATA:  Status post ORIF for a oblique fracture through the left tibial metaphysis. There is a known fracture through the junction of the left fibular head and neck. EXAM: DG C-ARM 61-120 MIN; LEFT KNEE - 1-2 VIEW COMPARISON:  Preoperative study of March 01, 2017 FINDINGS: Fluoro time reported is 2 minutes 31 seconds. 7 fluoro spot images are submitted. The patient has undergone placement of a side plate with cortical screws along the lateral aspect of the tibia. Radiographic positioning of the sideplate is good. The fracture has been reduced and is now near anatomic. The adjacent mildly displaced fibular head fracture is unchanged. IMPRESSION: Intraoperative fluoro spot series from ORIF of a proximal left tibial metaphyseal fracture. Alignment of the tibial fracture fragments is near anatomic after fixation. Electronically Signed   By: David  [place] M.D.   On: 03/02/2017 14:39   Dg Knee 2 Views Left  Result Date: 03/01/2017 CLINICAL DATA:  Left knee pain after fall at nursing facility. EXAM: LEFT KNEE - 1-2 VIEW COMPARISON:  None. FINDINGS: Moderately displaced fracture is seen involving the proximal left tibia. Severely displaced and comminuted fracture is seen involving the left proximal fibular head. Status post  intramedullary rod fixation of visualized femur. No definite joint effusion is noted. IMPRESSION: Moderately displaced proximal left tibial fracture. Severely comminuted and displaced left proximal fibular head fracture. Electronically Signed   By: Lupita Raider, M.D.   On: 03/01/2017 20:13   Dg Tibia/fibula Left  Result Date: 03/01/2017 CLINICAL DATA:  Left lower leg pain after fall at nursing facility. EXAM: LEFT TIBIA AND FIBULA - 2 VIEW COMPARISON:  None.  FINDINGS: Moderately displaced oblique fracture seen involving the proximal left tibia. Severely displaced proximal left fibular fracture is noted. Soft tissues are unremarkable. IMPRESSION: Moderately displaced proximal left tibial fracture, with severely displaced proximal left fibular fracture. Electronically Signed   By: Lupita Raider, M.D.   On: 03/01/2017 20:13   Dg Chest Port 1 View  Result Date: 03/01/2017 CLINICAL DATA:  Preoperative chest radiograph.  Initial encounter. EXAM: PORTABLE CHEST 1 VIEW COMPARISON:  Chest radiograph performed 07/04/2016 FINDINGS: The lungs are well-aerated. Mild vascular congestion may be transient in nature. There is no evidence of focal opacification, pleural effusion or pneumothorax. The cardiomediastinal silhouette is borderline enlarged. The patient is status post median sternotomy. No acute osseous abnormalities are seen. IMPRESSION: Mild vascular congestion may be transient in nature. Borderline cardiomegaly. Lungs remain grossly clear. No displaced rib fracture seen. Electronically Signed   By: Roanna Raider M.D.   On: 03/01/2017 22:11   Dg Knee Left Port  Result Date: 03/02/2017 CLINICAL DATA:  Status post ORIF for left tibial metaphyseal fracture. EXAM: PORTABLE LEFT KNEE - 1-2 VIEW COMPARISON:  Fluoro spot radiographs of the same day. FINDINGS: The patient has undergone ORIF with a side plate and cortical screws reducing the oblique fracture through the left tibial metaphysis. Alignment is now near and anatomic. The mildly displaced fibular head fracture is in stable position. There is an intramedullary rod in the distal shaft of the femur. No acute femur fracture is observed. IMPRESSION: Status post ORIF for a left proximal tibial metaphyseal fracture with near anatomic alignment achieved. No immediate postprocedure complication. Electronically Signed   By: David  [place] M.D.   On: 03/02/2017 15:36   Dg C-arm 61-120 Min  Result Date:  03/02/2017 CLINICAL DATA:  Status post ORIF for a oblique fracture through the left tibial metaphysis. There is a known fracture through the junction of the left fibular head and neck. EXAM: DG C-ARM 61-120 MIN; LEFT KNEE - 1-2 VIEW COMPARISON:  Preoperative study of March 01, 2017 FINDINGS: Fluoro time reported is 2 minutes 31 seconds. 7 fluoro spot images are submitted. The patient has undergone placement of a side plate with cortical screws along the lateral aspect of the tibia. Radiographic positioning of the sideplate is good. The fracture has been reduced and is now near anatomic. The adjacent mildly displaced fibular head fracture is unchanged. IMPRESSION: Intraoperative fluoro spot series from ORIF of a proximal left tibial metaphyseal fracture. Alignment of the tibial fracture fragments is near anatomic after fixation. Electronically Signed   By: David  [place] M.D.   On: 03/02/2017 14:39   Dg Hip Unilat With Pelvis 2-3 Views Left  Result Date: 03/01/2017 CLINICAL DATA:  Fall. EXAM: DG HIP (WITH OR WITHOUT PELVIS) 2-3V LEFT COMPARISON:  01/07/2017. FINDINGS: Previous ORIF of the proximal right femur. Chronically fractured right hip with superior displacement of the femoral neck. Prior ORIF of the left hip. No acute fracture or dislocation identified. IMPRESSION: 1. Chronic fracture and dislocation of the right hip 2. Previous ORIF of left hip.  No  acute fractures identified Electronically Signed   By: Signa Kell M.D.   On: 03/01/2017 20:13       Subjective: Patient feeling well, no nausea or vomiting, no chest pain. Leg pain has improved.   Discharge Exam: Vitals:   03/04/17 2237 03/05/17 0457  BP: (!) 141/56 131/62  Pulse: 85 70  Resp:  13  Temp: 98.4 F (36.9 C) (!) 97 F (36.1 C)  SpO2: 96% 98%   Vitals:   03/04/17 1500 03/04/17 2010 03/04/17 2237 03/05/17 0457  BP: (!) 116/44 140/66 (!) 141/56 131/62  Pulse: 74 72 85 70  Resp: Temp: 98.1 F (36.7 C) 97.6 F  (36.4 C) 98.4 F (36.9 C) (!) 97 F (36.1 C)  TempSrc: Oral Axillary Oral Axillary  SpO2: 90% 96% 96% 98%  Weight:      Height:         General: Not in pain or dyspnea, deconditioned Neurology: Awake and alert, non focal  E ENT: mild pallor, no icterus, oral mucosa moist Cardiovascular: No JVD. S1-S2 present, rhythmic, no gallops, rubs, or murmurs. No lower extremity edema. Dependent edema on the right foot, pitting +++.  Pulmonary: vesicular breath sounds bilaterally, adequate air movement, no wheezing, rhonchi or rales. Gastrointestinal. Abdomen flat, no organomegaly, non tender, no rebound or guarding Skin. Discoloration of the right leg/ ecchymosis. No erythema.  Musculoskeletal: no joint deformities    The results of significant diagnostics from this hospitalization (including imaging, microbiology, ancillary and laboratory) are listed below for reference.     Microbiology: Recent Results (from the past 240 hour(s))  Surgical PCR screen     Status: Abnormal   Collection Time: 03/02/17  4:00 AM  Result Value Ref Range Status   MRSA, PCR NEGATIVE NEGATIVE Final   Staphylococcus aureus POSITIVE (A) NEGATIVE Final    Comment: (NOTE) The Xpert SA Assay (FDA approved for NASAL specimens in patients 41 years of age and older), is one component of a comprehensive surveillance program. It is not intended to diagnose infection nor to guide or monitor treatment.   MRSA PCR Screening     Status: None   Collection Time: 03/02/17  9:52 AM  Result Value Ref Range Status   MRSA by PCR NEGATIVE NEGATIVE Final    Comment:        The GeneXpert MRSA Assay (FDA approved for NASAL specimens only), is one component of a comprehensive MRSA colonization surveillance program. It is not intended to diagnose MRSA infection nor to guide or monitor treatment for MRSA infections.      Labs: BNP (last 3 results) No results for input(s): BNP in the last 8760 hours. Basic Metabolic  Panel:  Recent Labs Lab 03/01/17 2009 03/04/17 0617  NA 138 140  K 3.4* 3.8  CL 101 108  CO2 24 24  GLUCOSE 124* 86  BUN 26* 20  CREATININE 1.15* 0.90  CALCIUM 8.5* 7.9*   Liver Function Tests: No results for input(s): AST, ALT, ALKPHOS, BILITOT, PROT, ALBUMIN in the last 168 hours. No results for input(s): LIPASE, AMYLASE in the last 168 hours. No results for input(s): AMMONIA in the last 168 hours. CBC:  Recent Labs Lab 03/01/17 2009 03/03/17 0527 03/04/17 0617  WBC 16.2* 11.6* 8.4  NEUTROABS  --   --  6.2  HGB 9.0* 7.1* 8.0*  HCT 27.0* 21.8* 24.7*  MCV 86.5 86.2 87.6  PLT 306 236 201   Cardiac Enzymes: No results for input(s): CKTOTAL, CKMB,  CKMBINDEX, TROPONINI in the last 168 hours. BNP: Invalid input(s): POCBNP CBG: No results for input(s): GLUCAP in the last 168 hours. D-Dimer No results for input(s): DDIMER in the last 72 hours. Hgb A1c No results for input(s): HGBA1C in the last 72 hours. Lipid Profile No results for input(s): CHOL, HDL, LDLCALC, TRIG, CHOLHDL, LDLDIRECT in the last 72 hours. Thyroid function studies No results for input(s): TSH, T4TOTAL, T3FREE, THYROIDAB in the last 72 hours.  Invalid input(s): FREET3 Anemia work up No results for input(s): VITAMINB12, FOLATE, FERRITIN, TIBC, IRON, RETICCTPCT in the last 72 hours. Urinalysis    Component Value Date/Time   COLORURINE YELLOW 06/30/2016 0935   APPEARANCEUR HAZY (A) 06/30/2016 0935   LABSPEC 1.019 06/30/2016 0935   PHURINE 5.0 06/30/2016 0935   GLUCOSEU NEGATIVE 06/30/2016 0935   HGBUR NEGATIVE 06/30/2016 0935   BILIRUBINUR NEGATIVE 06/30/2016 0935   KETONESUR NEGATIVE 06/30/2016 0935   PROTEINUR NEGATIVE 06/30/2016 0935   UROBILINOGEN 1.0 10/13/2012 0458   NITRITE NEGATIVE 06/30/2016 0935   LEUKOCYTESUR SMALL (A) 06/30/2016 0935   Sepsis Labs Invalid input(s): PROCALCITONIN,  WBC,  LACTICIDVEN Microbiology Recent Results (from the past 240 hour(s))  Surgical PCR screen      Status: Abnormal   Collection Time: 03/02/17  4:00 AM  Result Value Ref Range Status   MRSA, PCR NEGATIVE NEGATIVE Final   Staphylococcus aureus POSITIVE (A) NEGATIVE Final    Comment: (NOTE) The Xpert SA Assay (FDA approved for NASAL specimens in patients 46 years of age and older), is one component of a comprehensive surveillance program. It is not intended to diagnose infection nor to guide or monitor treatment.   MRSA PCR Screening     Status: None   Collection Time: 03/02/17  9:52 AM  Result Value Ref Range Status   MRSA by PCR NEGATIVE NEGATIVE Final    Comment:        The GeneXpert MRSA Assay (FDA approved for NASAL specimens only), is one component of a comprehensive MRSA colonization surveillance program. It is not intended to diagnose MRSA infection nor to guide or monitor treatment for MRSA infections.      Time coordinating discharge: 45 minutes  SIGNED:   Coralie Keens, MD  [facility] 03/05/2017, 12:50 PM Pager [phone]  If 7PM-7AM, please contact night-coverage [email or web address] Password [email or web address]

## 2017-03-05 NOTE — Progress Notes (Signed)
Orthopaedic Trauma Progress Note  S: no orthopaedic issues  O:  Vitals:   03/04/17 2237 03/05/17 0457  BP: (!) 141/56 131/62  Pulse: 85 70  Resp:  13  Temp: 98.4 F (36.9 C) (!) 97 F (36.1 C)  SpO2: 96% 98%  LLE: Dressing in place, compartments soft and compressible. Dressing removed and incisions clean, dry and intact. Active DF/PF, not fully cooperative with sensory exam. Able to flex knee to 60 degrees. Warm and well perfused foot  A/P: 83 year old female s/p ORIF proximal tibia now POD3  -WBAT LLE for transfers only -Daily dressing changes with adaptic, 4x4s and kerlix -DVT per primary, recommend Lovenox -PT/OT   Roby Lofts, MD [facility] [phone] (phone)

## 2017-03-07 LAB — TYPE AND SCREEN
ABO/RH(D): A POS
Antibody Screen: POSITIVE
DAT, IgG: NEGATIVE
UNIT DIVISION: 0
Unit division: 0

## 2017-03-07 LAB — BPAM RBC
BLOOD PRODUCT EXPIRATION DATE: 201810302359
Blood Product Expiration Date: 201810312359
ISSUE DATE / TIME: 201810112337
UNIT TYPE AND RH: 6200
Unit Type and Rh: 6200

## 2017-03-16 NOTE — Anesthesia Postprocedure Evaluation (Signed)
Anesthesia Post Note  Patient: [name]  Procedure(s) Performed: OPEN REDUCTION INTERNAL FIXATION (ORIF) TIBIAL PLATEAU (Left Leg Lower)     Patient location during evaluation: PACU Anesthesia Type: Spinal Level of consciousness: awake Pain management: pain level controlled Vital Signs Assessment: post-procedure vital signs reviewed and stable Respiratory status: spontaneous breathing Cardiovascular status: stable Postop Assessment: no headache, no backache, spinal receding, patient able to bend at knees and no apparent nausea or vomiting Anesthetic complications: no    Last Vitals:  Vitals:   03/05/17 0457 03/05/17 1332  BP: 131/62 130/62  Pulse: 70 85  Resp: 13 16  Temp: (!) 36.1 C 37.1 C  SpO2: 98% 96%    Last Pain:  Vitals:   03/05/17 1748  TempSrc:   PainSc: 2    Pain Goal: Patients Stated Pain Goal: 1 (03/03/17 1550)               Steen Bisig JR,JOHN Susann GivensFRANKLIN

## 2017-03-18 IMAGING — CR DG CHEST 2V
2 series · 2 of 2 positions shown · non-contrast
Comparison: October 13, 2012

CLINICAL DATA: Lower extremity edema and fever

EXAM:
CHEST  2 VIEW

[x chest ap]
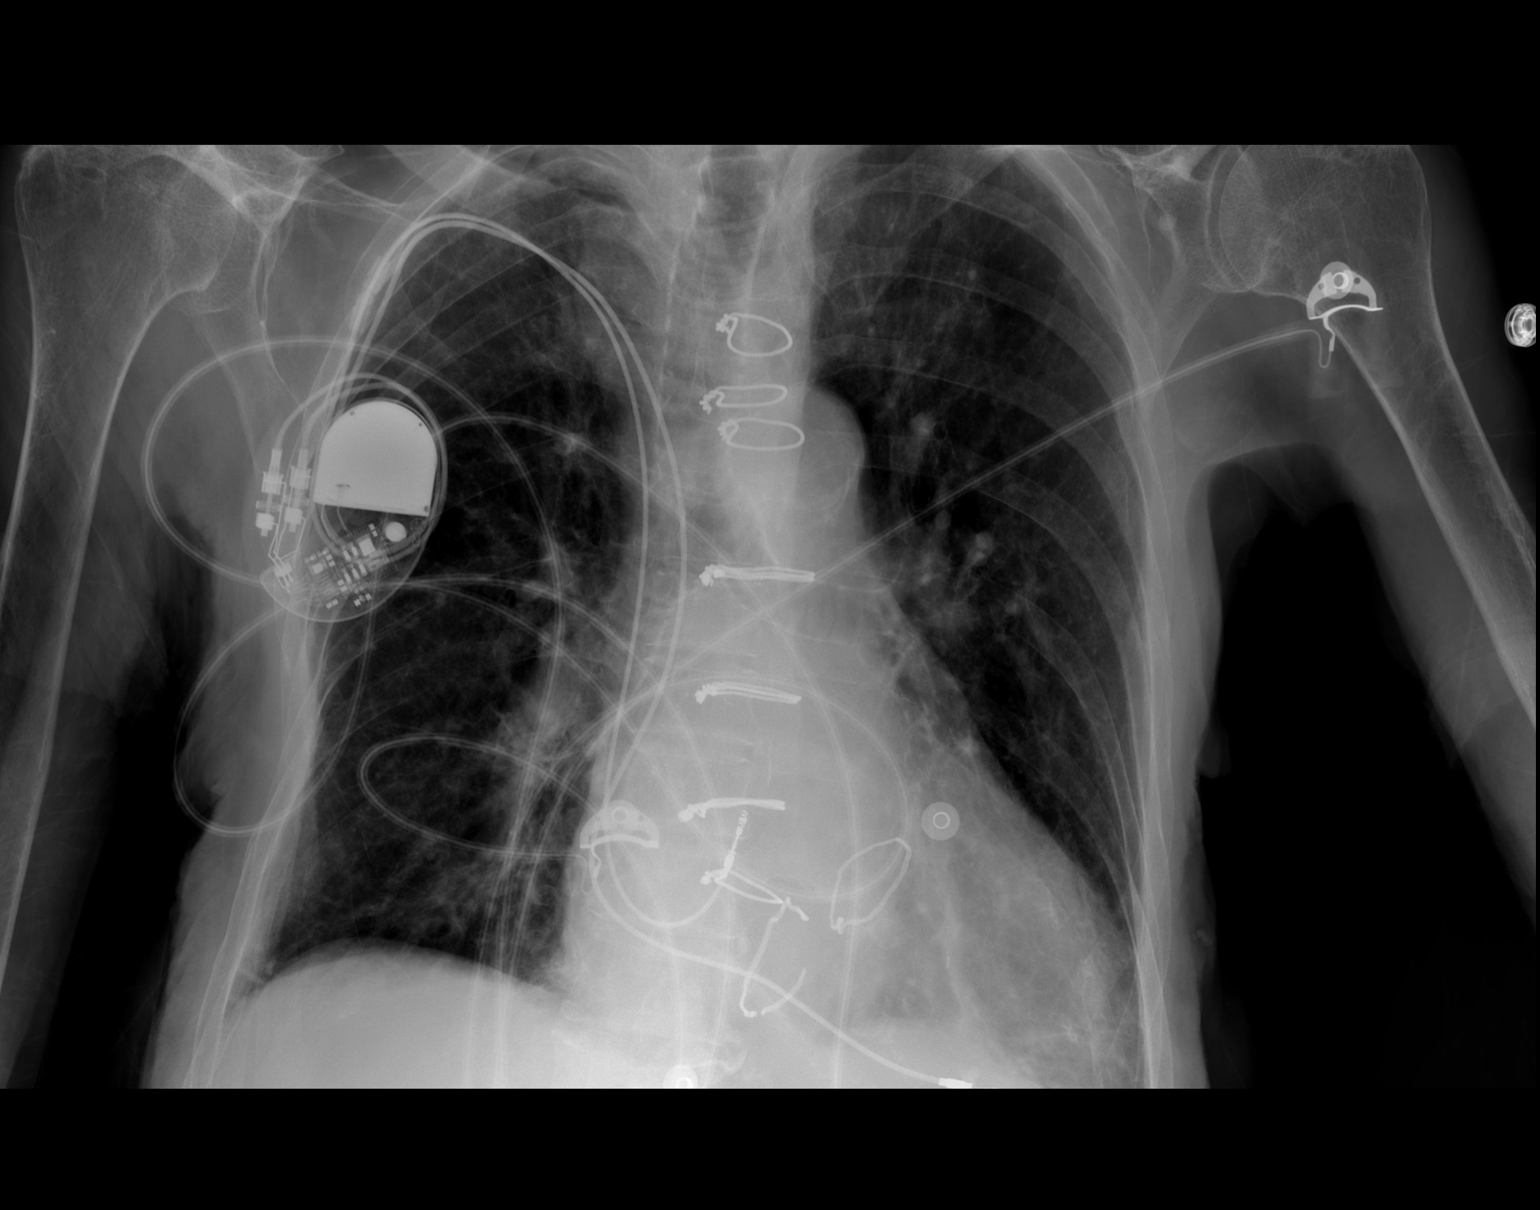

[w chest lat]
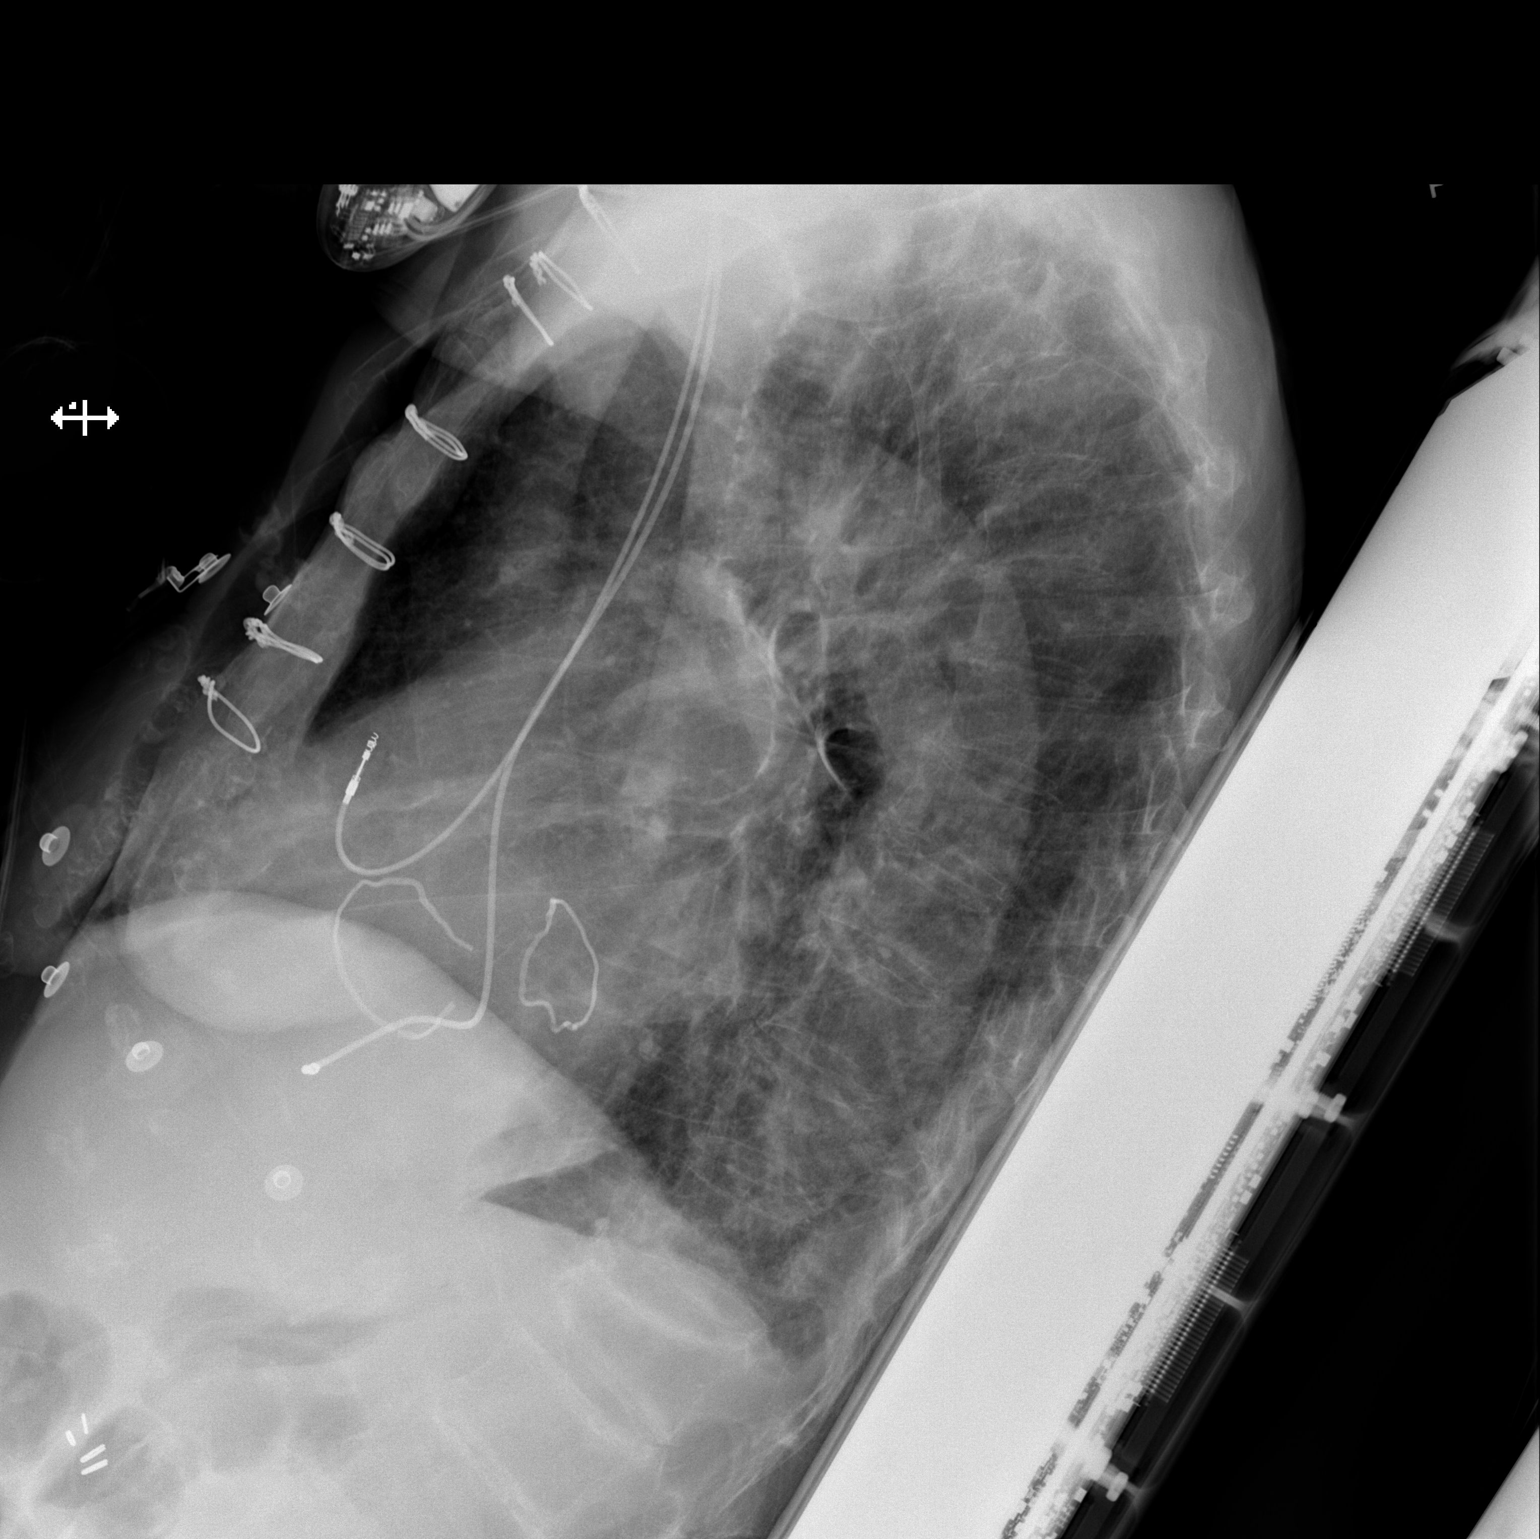

[2 of 2 positions shown; findings below may reference images not displayed]

FINDINGS: There is scarring in the left base, stable. There is no edema or
consolidation. Heart is slightly enlarged with pulmonary vascularity
within normal limits. Patient is status post tricuspid mitral valve
replacements. There is a pacemaker with lead tips attached to the
right atrium and middle cardiac vein. No adenopathy. No
pneumothorax. Bones are osteoporotic. There is wedge compression of
a lower thoracic vertebral body, stable. There is an old healed left
clavicle fracture. There is arthropathy in each shoulder.
IMPRESSION: Scarring left base. No edema or consolidation. Stable cardiac
prominence. Status post tricuspid and mitral valve replacements.
Pacemaker leads attached to the right heart.

## 2017-05-24 DEATH — deceased
